# Patient Record
Sex: Female | Born: 1950 | Race: White | Hispanic: No | Marital: Married | Smoking: Never smoker
Health system: Southern US, Community
[De-identification: ages and names within clinical notes are randomized; demographics above are authoritative.]

## PROBLEM LIST (undated history)

## (undated) DIAGNOSIS — F0390 Unspecified dementia without behavioral disturbance: Secondary | ICD-10-CM

## (undated) DIAGNOSIS — K219 Gastro-esophageal reflux disease without esophagitis: Secondary | ICD-10-CM

## (undated) DIAGNOSIS — K589 Irritable bowel syndrome without diarrhea: Secondary | ICD-10-CM

## (undated) DIAGNOSIS — R002 Palpitations: Secondary | ICD-10-CM

## (undated) DIAGNOSIS — J45909 Unspecified asthma, uncomplicated: Secondary | ICD-10-CM

## (undated) DIAGNOSIS — F419 Anxiety disorder, unspecified: Secondary | ICD-10-CM

## (undated) DIAGNOSIS — M5135 Other intervertebral disc degeneration, thoracolumbar region: Secondary | ICD-10-CM

## (undated) DIAGNOSIS — G529 Cranial nerve disorder, unspecified: Secondary | ICD-10-CM

## (undated) DIAGNOSIS — I4891 Unspecified atrial fibrillation: Secondary | ICD-10-CM

## (undated) DIAGNOSIS — G35 Multiple sclerosis: Secondary | ICD-10-CM

## (undated) HISTORY — PX: NASAL RECONSTRUCTION: SHX2069

## (undated) HISTORY — DX: Other intervertebral disc degeneration, thoracolumbar region: M51.35

## (undated) HISTORY — PX: HEEL SPUR SURGERY: SHX665

## (undated) HISTORY — DX: Anxiety disorder, unspecified: F41.9

## (undated) HISTORY — DX: Unspecified asthma, uncomplicated: J45.909

## (undated) HISTORY — DX: Multiple sclerosis: G35

## (undated) HISTORY — DX: Cranial nerve disorder, unspecified: G52.9

---

## 1997-10-10 HISTORY — PX: PARTIAL HYSTERECTOMY: SHX80

## 1999-08-25 ENCOUNTER — Encounter: Admission: RE | Admit: 1999-08-25 | Discharge: 1999-08-25 | Payer: Self-pay | Admitting: Family Medicine

## 1999-08-25 ENCOUNTER — Encounter: Payer: Self-pay | Admitting: Family Medicine

## 2000-04-06 ENCOUNTER — Encounter: Admission: RE | Admit: 2000-04-06 | Discharge: 2000-04-06 | Payer: Self-pay | Admitting: Obstetrics and Gynecology

## 2000-04-06 ENCOUNTER — Encounter: Payer: Self-pay | Admitting: Obstetrics and Gynecology

## 2001-03-21 ENCOUNTER — Encounter: Admission: RE | Admit: 2001-03-21 | Discharge: 2001-03-21 | Payer: Self-pay | Admitting: Family Medicine

## 2001-03-21 ENCOUNTER — Encounter: Payer: Self-pay | Admitting: Family Medicine

## 2001-05-11 ENCOUNTER — Encounter: Admission: RE | Admit: 2001-05-11 | Discharge: 2001-05-11 | Payer: Self-pay | Admitting: Obstetrics and Gynecology

## 2001-05-11 ENCOUNTER — Encounter: Payer: Self-pay | Admitting: Obstetrics and Gynecology

## 2001-05-18 ENCOUNTER — Encounter: Admission: RE | Admit: 2001-05-18 | Discharge: 2001-05-18 | Payer: Self-pay | Admitting: Obstetrics and Gynecology

## 2001-05-18 ENCOUNTER — Encounter: Payer: Self-pay | Admitting: Obstetrics and Gynecology

## 2002-09-10 ENCOUNTER — Encounter: Admission: RE | Admit: 2002-09-10 | Discharge: 2002-09-10 | Payer: Self-pay | Admitting: Obstetrics and Gynecology

## 2002-09-10 ENCOUNTER — Encounter: Payer: Self-pay | Admitting: Obstetrics and Gynecology

## 2002-12-27 ENCOUNTER — Encounter: Admission: RE | Admit: 2002-12-27 | Discharge: 2002-12-27 | Payer: Self-pay | Admitting: Family Medicine

## 2002-12-27 ENCOUNTER — Encounter: Payer: Self-pay | Admitting: Family Medicine

## 2003-09-23 ENCOUNTER — Encounter: Admission: RE | Admit: 2003-09-23 | Discharge: 2003-09-23 | Payer: Self-pay | Admitting: Family Medicine

## 2003-11-24 ENCOUNTER — Encounter: Admission: RE | Admit: 2003-11-24 | Discharge: 2003-11-24 | Payer: Self-pay | Admitting: Obstetrics and Gynecology

## 2003-11-26 ENCOUNTER — Encounter: Admission: RE | Admit: 2003-11-26 | Discharge: 2003-11-26 | Payer: Self-pay | Admitting: Obstetrics and Gynecology

## 2005-03-16 ENCOUNTER — Encounter: Admission: RE | Admit: 2005-03-16 | Discharge: 2005-03-16 | Payer: Self-pay | Admitting: Obstetrics and Gynecology

## 2005-04-20 ENCOUNTER — Encounter: Admission: RE | Admit: 2005-04-20 | Discharge: 2005-04-20 | Payer: Self-pay | Admitting: Neurosurgery

## 2005-05-13 ENCOUNTER — Encounter: Admission: RE | Admit: 2005-05-13 | Discharge: 2005-05-13 | Payer: Self-pay | Admitting: Neurosurgery

## 2005-06-17 ENCOUNTER — Encounter: Admission: RE | Admit: 2005-06-17 | Discharge: 2005-06-17 | Payer: Self-pay | Admitting: Neurosurgery

## 2006-01-17 ENCOUNTER — Encounter: Admission: RE | Admit: 2006-01-17 | Discharge: 2006-01-17 | Payer: Self-pay | Admitting: Family Medicine

## 2006-04-06 ENCOUNTER — Encounter: Admission: RE | Admit: 2006-04-06 | Discharge: 2006-04-06 | Payer: Self-pay | Admitting: Obstetrics and Gynecology

## 2007-02-08 HISTORY — PX: LUMBAR SPINE SURGERY: SHX701

## 2007-02-20 ENCOUNTER — Inpatient Hospital Stay (HOSPITAL_COMMUNITY): Admission: RE | Admit: 2007-02-20 | Discharge: 2007-02-21 | Payer: Self-pay | Admitting: Neurosurgery

## 2007-04-18 ENCOUNTER — Encounter: Admission: RE | Admit: 2007-04-18 | Discharge: 2007-04-18 | Payer: Self-pay | Admitting: Obstetrics and Gynecology

## 2008-04-18 ENCOUNTER — Encounter: Admission: RE | Admit: 2008-04-18 | Discharge: 2008-04-18 | Payer: Self-pay | Admitting: Obstetrics and Gynecology

## 2009-01-05 ENCOUNTER — Encounter: Admission: RE | Admit: 2009-01-05 | Discharge: 2009-01-05 | Payer: Self-pay | Admitting: Neurosurgery

## 2009-07-09 ENCOUNTER — Encounter: Admission: RE | Admit: 2009-07-09 | Discharge: 2009-07-09 | Payer: Self-pay | Admitting: Obstetrics and Gynecology

## 2009-07-21 ENCOUNTER — Encounter: Admission: RE | Admit: 2009-07-21 | Discharge: 2009-07-21 | Payer: Self-pay | Admitting: Neurosurgery

## 2010-08-20 ENCOUNTER — Encounter: Admission: RE | Admit: 2010-08-20 | Discharge: 2010-08-20 | Payer: Self-pay | Admitting: Obstetrics and Gynecology

## 2011-01-12 HISTORY — PX: SHOULDER SURGERY: SHX246

## 2011-02-25 NOTE — Op Note (Signed)
NAMELAKEN, ROG                 ACCOUNT NO.:  [id number]   MEDICAL RECORD NO.:  [id number]          PATIENT TYPE:  INP   LOCATION:  2899                         FACILITY:  [facility]   PHYSICIAN:  Payton Doughty, M.D.      DATE OF BIRTH:  June 04, 1951   DATE OF PROCEDURE:  02/20/2007  DATE OF DISCHARGE:                               OPERATIVE REPORT   PREOPERATIVE DIAGNOSIS:  Foraminal disk on the left side at L3-4.   POSTOPERATIVE DIAGNOSIS:  Foraminal disk on the left side at L3-4.   OPERATIVE PROCEDURE:  Left L3-4 foraminal diskectomy.   SURGEON:  Payton Doughty, M.D.   NURSE ASSISTANT:  [facility].   DOCTOR ASSISTANT:  Coletta Memos, M.D.   ANESTHESIA:  General endotracheal.   PREPARATION:  Betadine prep and scrub with alcohol wipe.   COMPLICATIONS:  None.   BODY OF TEXT:  This is a 60 year old girl with a foraminal disk at L3-4  on the left.  Taken to the operating room and smoothly anesthetized,  intubated, placed prone on the operating table.  Following shave, prep  and drape in the usual sterile fashion, the skin was infiltrated with 1%  lidocaine with 1:400,000 epinephrine.  The skin was incised from the  bottom of L2 to the top of L4 and the lamina of L3 along with L3-4 facet  joint was exposed on the left side.  Intraoperative x-ray confirmed  correctness of the level.  Having confirmed correctness of the level,  the lateral inferior portion of the pars and the superomedial portion of  the left L3-4 facet joint were removed with a high-speed drill down the  ligamentum flavum.  The ligamentum flavum was removed, exposing the left  L3 root as it rounded the pedicle.  This was retracted superior and  slightly laterally.  Working inferiorly into the disk space, a large  fragment of disk was demonstrated.  It was grasped and removed without  difficulty.  The disk space was carefully explored and all graspable  fragments removed.  The neural foramen was carefully explored and  found  to be free of disk debris.  The wound was irrigated and hemostasis  assured.  Depo-Medrol-soaked fat was used to fill the laminotomy defect.  Successive layers of - Vicryl, 2-0 Vicryl,  and4-0 Vicryl were used to  close.  Benzoin and Steri-Strips were placed, made occlusive with Telfa  and OpSite.  The patient returned to the recovery room in good  condition.    .           ______________________________  Payton Doughty, M.D.     MWR/MEDQ  D:  02/20/2007  T:  02/20/2007  Job:  [id number]
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
## 2012-01-03 ENCOUNTER — Ambulatory Visit: Payer: BC Managed Care – PPO | Attending: Neurology | Admitting: Physical Therapy

## 2012-01-03 DIAGNOSIS — IMO0001 Reserved for inherently not codable concepts without codable children: Secondary | ICD-10-CM | POA: Insufficient documentation

## 2012-01-03 DIAGNOSIS — R269 Unspecified abnormalities of gait and mobility: Secondary | ICD-10-CM | POA: Insufficient documentation

## 2012-01-10 ENCOUNTER — Ambulatory Visit: Payer: BC Managed Care – PPO | Attending: Neurology | Admitting: Physical Therapy

## 2012-01-10 DIAGNOSIS — IMO0001 Reserved for inherently not codable concepts without codable children: Secondary | ICD-10-CM | POA: Insufficient documentation

## 2012-01-10 DIAGNOSIS — R269 Unspecified abnormalities of gait and mobility: Secondary | ICD-10-CM | POA: Insufficient documentation

## 2012-01-13 ENCOUNTER — Ambulatory Visit: Payer: BC Managed Care – PPO | Admitting: Physical Therapy

## 2012-01-16 ENCOUNTER — Ambulatory Visit: Payer: BC Managed Care – PPO | Admitting: Physical Therapy

## 2012-01-19 ENCOUNTER — Ambulatory Visit: Payer: BC Managed Care – PPO | Admitting: Physical Therapy

## 2012-01-24 ENCOUNTER — Ambulatory Visit: Payer: BC Managed Care – PPO | Admitting: Physical Therapy

## 2012-01-26 ENCOUNTER — Ambulatory Visit: Payer: BC Managed Care – PPO | Admitting: Physical Therapy

## 2012-01-30 ENCOUNTER — Ambulatory Visit: Payer: BC Managed Care – PPO | Admitting: Physical Therapy

## 2012-02-01 ENCOUNTER — Ambulatory Visit: Payer: BC Managed Care – PPO | Admitting: Physical Therapy

## 2012-02-06 ENCOUNTER — Ambulatory Visit: Payer: BC Managed Care – PPO | Admitting: Physical Therapy

## 2012-02-08 ENCOUNTER — Ambulatory Visit: Payer: BC Managed Care – PPO | Attending: Neurology | Admitting: Physical Therapy

## 2012-02-08 DIAGNOSIS — IMO0001 Reserved for inherently not codable concepts without codable children: Secondary | ICD-10-CM | POA: Insufficient documentation

## 2012-02-08 DIAGNOSIS — R269 Unspecified abnormalities of gait and mobility: Secondary | ICD-10-CM | POA: Insufficient documentation

## 2012-02-13 ENCOUNTER — Ambulatory Visit: Payer: BC Managed Care – PPO | Admitting: Physical Therapy

## 2012-02-15 ENCOUNTER — Ambulatory Visit: Payer: BC Managed Care – PPO | Admitting: Physical Therapy

## 2012-02-20 ENCOUNTER — Ambulatory Visit: Payer: BC Managed Care – PPO | Admitting: Physical Therapy

## 2012-02-22 ENCOUNTER — Ambulatory Visit: Payer: BC Managed Care – PPO | Admitting: Physical Therapy

## 2012-02-27 ENCOUNTER — Ambulatory Visit: Payer: BC Managed Care – PPO | Admitting: Physical Therapy

## 2012-02-29 ENCOUNTER — Ambulatory Visit: Payer: BC Managed Care – PPO | Admitting: Physical Therapy

## 2012-04-16 ENCOUNTER — Ambulatory Visit
Admission: RE | Admit: 2012-04-16 | Discharge: 2012-04-16 | Disposition: A | Discharge status: Home / Self Care | Payer: BC Managed Care – PPO | Source: Ambulatory Visit | Attending: Family Medicine | Admitting: Family Medicine

## 2012-04-16 ENCOUNTER — Other Ambulatory Visit: Payer: Self-pay | Admitting: Family Medicine

## 2012-04-16 DIAGNOSIS — Z1231 Encounter for screening mammogram for malignant neoplasm of breast: Secondary | ICD-10-CM

## 2012-10-25 ENCOUNTER — Other Ambulatory Visit: Payer: Self-pay | Admitting: Family Medicine

## 2012-10-25 ENCOUNTER — Ambulatory Visit
Admission: RE | Admit: 2012-10-25 | Discharge: 2012-10-25 | Disposition: A | Discharge status: Home / Self Care | Payer: BC Managed Care – PPO | Source: Ambulatory Visit | Attending: Family Medicine | Admitting: Family Medicine

## 2012-10-25 DIAGNOSIS — M25531 Pain in right wrist: Secondary | ICD-10-CM

## 2012-11-09 ENCOUNTER — Ambulatory Visit
Admission: RE | Admit: 2012-11-09 | Discharge: 2012-11-09 | Disposition: A | Discharge status: Home / Self Care | Payer: BC Managed Care – PPO | Source: Ambulatory Visit | Attending: Family Medicine | Admitting: Family Medicine

## 2012-11-09 ENCOUNTER — Other Ambulatory Visit: Payer: Self-pay | Admitting: Family Medicine

## 2012-11-09 DIAGNOSIS — W19XXXA Unspecified fall, initial encounter: Secondary | ICD-10-CM

## 2013-01-18 ENCOUNTER — Other Ambulatory Visit: Payer: Self-pay

## 2013-01-18 MED ORDER — LORAZEPAM 0.5 MG PO TABS: 0.5000 mg | ORAL_TABLET | Freq: Two times a day (BID) | ORAL | Status: DC

## 2013-01-18 NOTE — Telephone Encounter (Signed)
Former [facility] patient requesting refill.  Dr Eulah Citizen

## 2013-02-21 ENCOUNTER — Encounter: Payer: Self-pay | Admitting: Diagnostic Neuroimaging

## 2013-02-21 ENCOUNTER — Ambulatory Visit (INDEPENDENT_AMBULATORY_CARE_PROVIDER_SITE_OTHER): Payer: BC Managed Care – PPO | Admitting: Diagnostic Neuroimaging

## 2013-02-21 VITALS — BP 144/77 | HR 60 | Temp 97.7°F | Ht 65.5 in | Wt 124.0 lb

## 2013-02-21 DIAGNOSIS — G35 Multiple sclerosis: Secondary | ICD-10-CM | POA: Insufficient documentation

## 2013-02-21 NOTE — Patient Instructions (Signed)
Continue current medications. 
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
## 2013-03-10 ENCOUNTER — Other Ambulatory Visit: Payer: Self-pay

## 2013-03-10 MED ORDER — GABAPENTIN 300 MG PO CAPS: 300.0000 mg | ORAL_CAPSULE | Freq: Three times a day (TID) | ORAL | Status: DC

## 2013-03-17 ENCOUNTER — Other Ambulatory Visit: Payer: Self-pay

## 2013-03-17 MED ORDER — GABAPENTIN 300 MG PO CAPS: 300.0000 mg | ORAL_CAPSULE | Freq: Three times a day (TID) | ORAL | Status: DC

## 2013-03-28 ENCOUNTER — Other Ambulatory Visit: Payer: Self-pay

## 2013-03-28 DIAGNOSIS — Z1231 Encounter for screening mammogram for malignant neoplasm of breast: Secondary | ICD-10-CM

## 2013-04-17 ENCOUNTER — Ambulatory Visit
Admission: RE | Admit: 2013-04-17 | Discharge: 2013-04-17 | Disposition: A | Discharge status: Home / Self Care | Payer: BC Managed Care – PPO | Source: Ambulatory Visit

## 2013-04-17 DIAGNOSIS — Z1231 Encounter for screening mammogram for malignant neoplasm of breast: Secondary | ICD-10-CM

## 2013-06-03 ENCOUNTER — Telehealth: Payer: Self-pay | Admitting: Diagnostic Neuroimaging

## 2013-06-03 NOTE — Telephone Encounter (Signed)
I called pt and she will come in tomorrow at 0815 for appt with LL/NP for MS? Exacerbation.  LE weakness, worsening balance, recovering from GI illness.

## 2013-06-04 ENCOUNTER — Ambulatory Visit (INDEPENDENT_AMBULATORY_CARE_PROVIDER_SITE_OTHER): Payer: BC Managed Care – PPO | Admitting: Nurse Practitioner

## 2013-06-04 ENCOUNTER — Encounter: Payer: Self-pay | Admitting: Nurse Practitioner

## 2013-06-04 VITALS — BP 110/68 | HR 67 | Temp 97.5°F | Ht 66.0 in | Wt 114.5 lb

## 2013-06-04 DIAGNOSIS — G35 Multiple sclerosis: Secondary | ICD-10-CM
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
## 2013-06-04 NOTE — Patient Instructions (Addendum)
Check urinalysis today.  We will schedule a repeat MRI brian with and without contrast.  We will order PT for balance and stability.  From MRI results we will plan further treatment.

## 2013-06-05 LAB — URINALYSIS, ROUTINE W REFLEX MICROSCOPIC
Bilirubin, UA: NEGATIVE
Glucose, UA: NEGATIVE
Ketones, UA: NEGATIVE
Leukocytes, UA: NEGATIVE
Nitrite, UA: NEGATIVE
Protein, UA: NEGATIVE
RBC, UA: NEGATIVE
Specific Gravity, UA: 1.01 (ref 1.005–1.030)
Urobilinogen, Ur: 0.2 mg/dL (ref 0.0–1.9)
pH, UA: 6 (ref 5.0–7.5)

## 2013-06-06 ENCOUNTER — Telehealth: Payer: Self-pay

## 2013-06-06 NOTE — Telephone Encounter (Signed)
Message copied by Eduard Penkala R on Thu Jun 06, 2013 11:08 AM ------      Message from: LAM, Larita Fife E      Created: Wed Jun 05, 2013 12:17 PM       Normal results.-LL ------

## 2013-06-06 NOTE — Telephone Encounter (Signed)
Called and spoke to patient normal labs. Patient understood. 

## 2013-06-07 NOTE — Progress Notes (Signed)
I reviewed note and agree with plan.   Suanne Marker, MD 06/07/2013, 5:01 PM Certified in Neurology, Neurophysiology and Neuroimaging  [facility] [address], [address] [place], [place] [address] [phone]

## 2013-06-18 ENCOUNTER — Ambulatory Visit: Payer: BC Managed Care – PPO | Attending: Nurse Practitioner

## 2013-06-18 DIAGNOSIS — R262 Difficulty in walking, not elsewhere classified: Secondary | ICD-10-CM | POA: Insufficient documentation

## 2013-06-18 DIAGNOSIS — R42 Dizziness and giddiness: Secondary | ICD-10-CM | POA: Insufficient documentation

## 2013-06-18 DIAGNOSIS — IMO0001 Reserved for inherently not codable concepts without codable children: Secondary | ICD-10-CM | POA: Insufficient documentation

## 2013-06-18 DIAGNOSIS — R279 Unspecified lack of coordination: Secondary | ICD-10-CM | POA: Insufficient documentation

## 2013-06-19 ENCOUNTER — Ambulatory Visit: Payer: BC Managed Care – PPO

## 2013-06-25 ENCOUNTER — Ambulatory Visit: Payer: BC Managed Care – PPO

## 2013-06-27 ENCOUNTER — Ambulatory Visit (INDEPENDENT_AMBULATORY_CARE_PROVIDER_SITE_OTHER): Payer: BC Managed Care – PPO

## 2013-06-27 ENCOUNTER — Ambulatory Visit: Payer: BC Managed Care – PPO | Admitting: Rehabilitative and Restorative Service Providers"

## 2013-06-27 DIAGNOSIS — G35 Multiple sclerosis: Secondary | ICD-10-CM

## 2013-06-28 MED ORDER — GADOPENTETATE DIMEGLUMINE 469.01 MG/ML IV SOLN: 10.0000 mL | Freq: Once | INTRAVENOUS | Status: AC | PRN

## 2013-07-01 NOTE — Progress Notes (Signed)
Quick Note:  Called pt about her results. Pt verbalized understanding. ______

## 2013-07-02 ENCOUNTER — Ambulatory Visit: Payer: BC Managed Care – PPO | Admitting: Rehabilitative and Restorative Service Providers"

## 2013-07-03 ENCOUNTER — Other Ambulatory Visit: Payer: BC Managed Care – PPO

## 2013-07-04 ENCOUNTER — Other Ambulatory Visit: Payer: Self-pay

## 2013-07-04 ENCOUNTER — Ambulatory Visit: Payer: BC Managed Care – PPO

## 2013-07-04 MED ORDER — INTERFERON BETA-1A 44 MCG/0.5ML ~~LOC~~ SOLN: 44.0000 ug | SUBCUTANEOUS | Status: DC

## 2013-07-09 ENCOUNTER — Ambulatory Visit: Payer: BC Managed Care – PPO | Admitting: Rehabilitative and Restorative Service Providers"

## 2013-07-11 ENCOUNTER — Ambulatory Visit: Payer: BC Managed Care – PPO | Attending: Nurse Practitioner

## 2013-07-11 DIAGNOSIS — IMO0001 Reserved for inherently not codable concepts without codable children: Secondary | ICD-10-CM | POA: Insufficient documentation

## 2013-07-11 DIAGNOSIS — R279 Unspecified lack of coordination: Secondary | ICD-10-CM | POA: Insufficient documentation

## 2013-07-11 DIAGNOSIS — R262 Difficulty in walking, not elsewhere classified: Secondary | ICD-10-CM | POA: Insufficient documentation

## 2013-07-11 DIAGNOSIS — R42 Dizziness and giddiness: Secondary | ICD-10-CM | POA: Insufficient documentation

## 2013-07-20 ENCOUNTER — Other Ambulatory Visit: Payer: Self-pay | Admitting: Neurology

## 2013-07-22 NOTE — Telephone Encounter (Signed)
Rx signed and faxed.

## 2013-08-15 ENCOUNTER — Other Ambulatory Visit: Payer: Self-pay

## 2013-10-14 ENCOUNTER — Ambulatory Visit: Payer: BC Managed Care – PPO | Admitting: Diagnostic Neuroimaging

## 2013-10-23 ENCOUNTER — Ambulatory Visit: Payer: BC Managed Care – PPO | Admitting: Diagnostic Neuroimaging

## 2013-10-24 ENCOUNTER — Ambulatory Visit (INDEPENDENT_AMBULATORY_CARE_PROVIDER_SITE_OTHER): Payer: BC Managed Care – PPO | Admitting: Diagnostic Neuroimaging

## 2013-10-24 ENCOUNTER — Encounter: Payer: Self-pay | Admitting: Diagnostic Neuroimaging

## 2013-10-24 VITALS — BP 120/80 | HR 65 | Ht 66.5 in | Wt 112.0 lb

## 2013-10-24 DIAGNOSIS — G35 Multiple sclerosis: Secondary | ICD-10-CM
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
## 2013-11-26 ENCOUNTER — Other Ambulatory Visit: Payer: Self-pay | Admitting: Diagnostic Neuroimaging

## 2013-11-26 ENCOUNTER — Other Ambulatory Visit: Payer: Self-pay

## 2013-11-26 MED ORDER — GABAPENTIN 300 MG PO CAPS: 300.0000 mg | ORAL_CAPSULE | Freq: Three times a day (TID) | ORAL | Status: DC

## 2013-11-26 NOTE — Telephone Encounter (Signed)
Patient calling to state that [facility] says that they cannot get her Gabapentin refill to her until the end of the month and patient only has enough for 3 days. Patient is wondering if she can get a script sent to [facility] on [address] to last her until she can get it at the end of the month. Patient takes the medication 3 times a day.

## 2014-01-06 ENCOUNTER — Other Ambulatory Visit: Payer: Self-pay

## 2014-01-06 MED ORDER — LORAZEPAM 0.5 MG PO TABS: 0.5000 mg | ORAL_TABLET | Freq: Two times a day (BID) | ORAL | Status: DC

## 2014-01-14 ENCOUNTER — Telehealth: Payer: Self-pay | Admitting: Diagnostic Neuroimaging

## 2014-01-14 NOTE — Telephone Encounter (Signed)
Patient calling to state that she has been having a pain in her head since last Thursday. Patient states that it is not a headache, it is a pain that is centered in the same spot on the front of her head to the left side. Patient states it alternates between sharp and dull. Please call and advise patient.

## 2014-01-15 ENCOUNTER — Encounter: Payer: Self-pay | Admitting: Diagnostic Neuroimaging

## 2014-01-15 ENCOUNTER — Telehealth: Payer: Self-pay | Admitting: Diagnostic Neuroimaging

## 2014-01-15 NOTE — Telephone Encounter (Signed)
Pt called to check the status of the fax that was sent by [facility] for her LORazepam (ATIVAN) 0.5 MG tablet. Please call her to let her know when it has been sent to [facility].  Thank you.

## 2014-01-15 NOTE — Telephone Encounter (Signed)
This Rx was sent to [facility] on 03/30 (please see refill).  I called the patient back.  She is aware.  They can take 10-14 days to process and send Rx's.  She will call us back if anything further is needed.

## 2014-01-16 ENCOUNTER — Other Ambulatory Visit: Payer: Self-pay | Admitting: Neurology

## 2014-01-16 DIAGNOSIS — R51 Headache: Secondary | ICD-10-CM

## 2014-01-16 NOTE — Telephone Encounter (Signed)
Patient notes sharp left temporal headache. No visual changes. Will check ESR to rule out GCA in 62y/o with new onset headache.

## 2014-01-16 NOTE — Telephone Encounter (Signed)
Called patient and she has had labs completed , explained that once reviewed will call, she verbalized understanding

## 2014-01-20 ENCOUNTER — Other Ambulatory Visit: Payer: Self-pay | Admitting: Diagnostic Neuroimaging

## 2014-01-21 ENCOUNTER — Telehealth: Payer: Self-pay | Admitting: *Deleted

## 2014-01-21 NOTE — Telephone Encounter (Signed)
Called patient and asked if she could have [facility] in [place] fax the results to our office and we will give to physician to review, tried calling several times, could not reach

## 2014-01-23 NOTE — Telephone Encounter (Signed)
I called and gave the result of ESR to pt (normal).  S he stated still has L temporal headache, better now then before.  (new problem).  She has MS.  Taking gabapentin 300mg po tid (for last 2 yrs).  Made appt for 02-04-14 at 1100.  She verbalized understanding.   Last MRI 06-2013. (no changes).  

## 2014-02-04 ENCOUNTER — Encounter: Payer: Self-pay | Admitting: Diagnostic Neuroimaging

## 2014-02-04 ENCOUNTER — Ambulatory Visit (INDEPENDENT_AMBULATORY_CARE_PROVIDER_SITE_OTHER): Payer: BC Managed Care – PPO | Admitting: Diagnostic Neuroimaging

## 2014-02-04 VITALS — BP 104/67 | HR 70 | Ht 65.0 in | Wt 105.0 lb

## 2014-02-04 DIAGNOSIS — G35 Multiple sclerosis: Secondary | ICD-10-CM

## 2014-02-04 DIAGNOSIS — R51 Headache: Secondary | ICD-10-CM

## 2014-02-04 DIAGNOSIS — G35D Multiple sclerosis, unspecified: Secondary | ICD-10-CM

## 2014-02-04 MED ORDER — GABAPENTIN 300 MG PO CAPS: 600.0000 mg | ORAL_CAPSULE | Freq: Three times a day (TID) | ORAL | Status: DC

## 2014-02-04 NOTE — Patient Instructions (Signed)
Try increased gabapentin.
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
## 2014-02-06 ENCOUNTER — Ambulatory Visit (INDEPENDENT_AMBULATORY_CARE_PROVIDER_SITE_OTHER): Payer: BC Managed Care – PPO

## 2014-02-06 DIAGNOSIS — G35 Multiple sclerosis: Secondary | ICD-10-CM

## 2014-02-06 DIAGNOSIS — R51 Headache: Secondary | ICD-10-CM

## 2014-02-06 MED ORDER — GADOPENTETATE DIMEGLUMINE 469.01 MG/ML IV SOLN
10.0000 mL | Freq: Once | INTRAVENOUS | Status: AC | PRN
Start: 2014-02-06 — End: 2014-02-06

## 2014-02-12 ENCOUNTER — Telehealth: Payer: Self-pay | Admitting: *Deleted

## 2014-02-12 NOTE — Telephone Encounter (Signed)
Pls call pt with MRI results. No change from prior. MRI is stable. Continue current plan. -VRP

## 2014-02-12 NOTE — Telephone Encounter (Signed)
Patient calling to get MRI results. °

## 2014-02-13 NOTE — Telephone Encounter (Signed)
Informed patient, verbalized understanding.

## 2014-02-18 ENCOUNTER — Other Ambulatory Visit: Payer: Self-pay | Admitting: Gastroenterology

## 2014-02-18 DIAGNOSIS — R634 Abnormal weight loss: Secondary | ICD-10-CM

## 2014-02-18 DIAGNOSIS — R109 Unspecified abdominal pain: Secondary | ICD-10-CM

## 2014-02-26 ENCOUNTER — Encounter: Payer: Self-pay | Admitting: Diagnostic Neuroimaging

## 2014-02-26 ENCOUNTER — Other Ambulatory Visit: Payer: BC Managed Care – PPO

## 2014-02-27 ENCOUNTER — Ambulatory Visit
Admission: RE | Admit: 2014-02-27 | Discharge: 2014-02-27 | Disposition: A | Discharge status: Home / Self Care | Payer: BC Managed Care – HMO | Source: Ambulatory Visit | Attending: Gastroenterology | Admitting: Gastroenterology

## 2014-02-27 DIAGNOSIS — R634 Abnormal weight loss: Secondary | ICD-10-CM

## 2014-02-27 DIAGNOSIS — R109 Unspecified abdominal pain: Secondary | ICD-10-CM

## 2014-02-27 MED ORDER — IOHEXOL 300 MG/ML  SOLN
100.0000 mL | Freq: Once | INTRAMUSCULAR | Status: AC | PRN
  Administered 2014-02-27: 100 mL via INTRAVENOUS

## 2014-03-14 ENCOUNTER — Other Ambulatory Visit: Payer: Self-pay

## 2014-03-21 ENCOUNTER — Telehealth: Payer: Self-pay | Admitting: Diagnostic Neuroimaging

## 2014-03-21 ENCOUNTER — Other Ambulatory Visit: Payer: Self-pay | Admitting: Diagnostic Neuroimaging

## 2014-03-21 MED ORDER — BACLOFEN 10 MG PO TABS: 10.0000 mg | ORAL_TABLET | Freq: Every day | ORAL | Status: DC

## 2014-03-21 NOTE — Telephone Encounter (Signed)
Patient requesting refill for baclofen (LIORESAL) 10 MG tablet and send Rx to [facility], [place].

## 2014-03-21 NOTE — Telephone Encounter (Signed)
Pt's Rx was sent to pt's pharmacy. °

## 2014-04-10 LAB — SEDIMENTATION RATE: Sed Rate: 2 mm/hr (ref 0–40)

## 2014-04-17 NOTE — Progress Notes (Signed)
Quick Note:  I called pt 01-23-14 and gave her normal results. See phone note. ______

## 2014-04-22 ENCOUNTER — Ambulatory Visit (INDEPENDENT_AMBULATORY_CARE_PROVIDER_SITE_OTHER): Payer: BC Managed Care – PPO | Admitting: Diagnostic Neuroimaging

## 2014-04-22 ENCOUNTER — Encounter: Payer: Self-pay | Admitting: Diagnostic Neuroimaging

## 2014-04-22 VITALS — BP 117/75 | HR 77 | Temp 98.1°F | Ht 66.0 in | Wt 106.2 lb

## 2014-04-22 DIAGNOSIS — G35 Multiple sclerosis: Secondary | ICD-10-CM
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
## 2014-05-15 NOTE — Telephone Encounter (Signed)
Noted  

## 2014-05-30 ENCOUNTER — Telehealth: Payer: Self-pay | Admitting: *Deleted

## 2014-05-30 MED ORDER — BACLOFEN 10 MG PO TABS: 10.0000 mg | ORAL_TABLET | Freq: Every day | ORAL | Status: DC

## 2014-05-30 NOTE — Telephone Encounter (Signed)
Rx has been sent  

## 2014-06-02 DIAGNOSIS — Z0289 Encounter for other administrative examinations: Secondary | ICD-10-CM

## 2014-06-02 NOTE — Telephone Encounter (Signed)
Called patient to confirm message received.  No answer. She is requesting a letter including her diagnosis and the need for her MRI this year and PT last year so that her insurance [employer]ulance [employer]) can pay the cost.

## 2014-06-05 NOTE — Telephone Encounter (Signed)
pls draft letter for my review. -VRP

## 2014-06-06 ENCOUNTER — Encounter: Payer: Self-pay | Admitting: *Deleted

## 2014-06-06 NOTE — Telephone Encounter (Signed)
Created letter. Called patient. Faxed letter to patient as requested.

## 2014-06-11 ENCOUNTER — Telehealth: Payer: Self-pay | Admitting: Diagnostic Neuroimaging

## 2014-06-11 NOTE — Telephone Encounter (Signed)
Mailed to patient on 06/11/14.

## 2014-06-23 ENCOUNTER — Other Ambulatory Visit: Payer: Self-pay | Admitting: Family Medicine

## 2014-06-23 ENCOUNTER — Ambulatory Visit
Admission: RE | Admit: 2014-06-23 | Discharge: 2014-06-23 | Disposition: A | Discharge status: Home / Self Care | Payer: BC Managed Care – PPO | Source: Ambulatory Visit | Attending: Family Medicine | Admitting: Family Medicine

## 2014-06-23 DIAGNOSIS — R634 Abnormal weight loss: Secondary | ICD-10-CM

## 2014-06-26 ENCOUNTER — Encounter: Payer: Self-pay | Admitting: Dietician

## 2014-06-26 ENCOUNTER — Encounter: Payer: BC Managed Care – PPO | Attending: Family Medicine | Admitting: Dietician

## 2014-06-26 VITALS — Ht 66.0 in | Wt 100.0 lb

## 2014-06-26 DIAGNOSIS — R634 Abnormal weight loss: Secondary | ICD-10-CM | POA: Insufficient documentation

## 2014-06-26 DIAGNOSIS — Z713 Dietary counseling and surveillance: Secondary | ICD-10-CM | POA: Diagnosis not present

## 2014-06-26 NOTE — Progress Notes (Signed)
  Medical Nutrition Therapy:  Appt start time: 0930 end time:  1030.   Assessment:  Primary concerns today: Ms. Kimbrough is here today to discuss her weight loss and GI discomfort with most foods. Many foods cause lower abdominal discomfort, bowel leakage, and gas. Additionally, she "keeps losing weight." She has an appointment at [facility] on October 8 and is hoping for more answers regarding what is going on. Kaliegh reports that her weight loss started about 9 months ago, she has since lost about 25 pounds. She states that she loves to cook and enjoys trying new things. Tried a bowel prep/colon cleanse and reports that her abdomen swelled, and she did not have a bowel movement until the next day. Per her report, she seems to better tolerate gentle, low fiber foods like tilapia, mashed potatoes, chicken in broth, egg whites, oatmeal, and rice. She is unable to tolerate pasta, wine, caffeine, raw vegetables, carbonation, lactose, peanut butter, artificial sweeteners, and soy products. She attempted a gluten-free diet and reports that it worked at first.   Occupational hygienist Style:   No preference indicated   Learning Readiness:   Ready   MEDICATIONS: see list    Beverages: only water. "Probably not enough." Too much causes discomfort.   Usual physical activity: none  Estimated energy needs: 1800-2000 calories  Progress Towards Goal(s):  In progress.   Nutritional Diagnosis:  [place]-3.2 Unintentional weight loss As related to inability to tolerate sufficient calorie and protein intake.  As evidenced by patient report of 25-lb weight loss and gastrointestinal issues in the past 9 months.    Intervention:  Nutrition counseling provided. Goals: -Try having something small to eat several times a day -Increase protein foods (and fatty foods when tolerated): chicken, tilapia, egg whites, french fries, olive oil, [place] bacon  -Aim for 64 oz of water per day -Try Isopure products (lactose-free protein  drinks) -Keep something sugary on hand for low blood sugars  Samples provided and patient instructed on proper use: PB2 (qty 5) Lot#: [id number] Exp: 05/2015   Teaching Method Utilized:  Visual Auditory  Handouts given during visit include:  Low fiber food list  Low fiber nutrition therapy  Barriers to learning/adherence to lifestyle change: gastrointestinal discomfort  Demonstrated degree of understanding via:  Teach Back   Monitoring/Evaluation:  Dietary intake and body weight in 4-6 week(s).

## 2014-06-26 NOTE — Patient Instructions (Addendum)
-  Try having something small to eat several times a day -Increase protein foods (and fatty foods when tolerated): chicken, tilapia, egg whites, french fries, olive oil, [place] bacon  -Aim for 64 oz of water per day -Try Isopure products (lactose-free protein products) -Keep something sugary on hand for low blood sugars

## 2014-07-14 ENCOUNTER — Ambulatory Visit
Admission: RE | Admit: 2014-07-14 | Discharge: 2014-07-14 | Disposition: A | Discharge status: Home / Self Care | Payer: BC Managed Care – PPO | Source: Ambulatory Visit

## 2014-07-14 ENCOUNTER — Other Ambulatory Visit: Payer: Self-pay

## 2014-07-14 DIAGNOSIS — Z1239 Encounter for other screening for malignant neoplasm of breast: Secondary | ICD-10-CM

## 2014-07-21 ENCOUNTER — Other Ambulatory Visit: Payer: Self-pay

## 2014-07-21 MED ORDER — BACLOFEN 10 MG PO TABS: 10.0000 mg | ORAL_TABLET | Freq: Every day | ORAL | Status: DC

## 2014-07-21 MED ORDER — GABAPENTIN 300 MG PO CAPS: 600.0000 mg | ORAL_CAPSULE | Freq: Three times a day (TID) | ORAL | Status: DC

## 2014-07-21 MED ORDER — INTERFERON BETA-1A 44 MCG/0.5ML ~~LOC~~ SOLN: 44.0000 ug | SUBCUTANEOUS | Status: DC

## 2014-07-21 MED ORDER — LORAZEPAM 0.5 MG PO TABS: 0.5000 mg | ORAL_TABLET | Freq: Two times a day (BID) | ORAL | Status: DC

## 2014-07-22 NOTE — Telephone Encounter (Signed)
Rx signed and faxed.

## 2014-08-06 ENCOUNTER — Ambulatory Visit: Payer: BC Managed Care – PPO | Admitting: Dietician

## 2014-08-20 ENCOUNTER — Telehealth: Payer: Self-pay | Admitting: Diagnostic Neuroimaging

## 2014-08-20 NOTE — Telephone Encounter (Signed)
WID- Patient stated she saw doctor at [facility] last month, it

## 2014-08-20 NOTE — Telephone Encounter (Signed)
WID - Patient stated she saw doctor at [facility] last month and had waited at least 4 weeks for the appointment.  When she received appointment her weight at that time was 95 lbs.  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  Was given Antibiotics for the gut.  Questioning if her MS could have anything to do with affecting Intestinal Muscles.  Please call and advise.

## 2014-08-20 NOTE — Telephone Encounter (Signed)
Yes, MS can cause dysautonomia, such as gastric emptying difficulties,  but  overgrowth is mostly seen in response to steroid treatments.  Please call her, thanks. CD WID

## 2014-08-21 NOTE — Telephone Encounter (Signed)
I called and spoke to pt.   Gave her the information as per below per Dr. Vickey Hugerohmeier.  She is on ABX now for the overgrowth.  Will forward message to Dr.Penumalli.   Is followed by [facility] Ren[facility]issa [facility].

## 2014-10-08 ENCOUNTER — Encounter: Payer: Self-pay | Admitting: Diagnostic Neuroimaging

## 2014-10-08 ENCOUNTER — Ambulatory Visit (INDEPENDENT_AMBULATORY_CARE_PROVIDER_SITE_OTHER): Payer: BC Managed Care – PPO | Admitting: Diagnostic Neuroimaging

## 2014-10-08 VITALS — BP 105/64 | HR 68 | Temp 97.5°F | Ht 66.0 in | Wt 98.6 lb

## 2014-10-08 DIAGNOSIS — G35 Multiple sclerosis: Secondary | ICD-10-CM

## 2014-10-08 NOTE — Patient Instructions (Signed)
Continue rebif, baclofen and gabapentin.
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
## 2014-10-28 ENCOUNTER — Ambulatory Visit: Payer: BC Managed Care – PPO | Admitting: Diagnostic Neuroimaging

## 2014-11-19 ENCOUNTER — Ambulatory Visit (INDEPENDENT_AMBULATORY_CARE_PROVIDER_SITE_OTHER): Payer: BLUE CROSS/BLUE SHIELD | Admitting: Diagnostic Neuroimaging

## 2014-11-19 ENCOUNTER — Encounter: Payer: Self-pay | Admitting: Diagnostic Neuroimaging

## 2014-11-19 ENCOUNTER — Telehealth: Payer: Self-pay | Admitting: *Deleted

## 2014-11-19 VITALS — BP 139/81 | HR 69 | Ht 66.0 in | Wt 100.8 lb

## 2014-11-19 DIAGNOSIS — G35 Multiple sclerosis: Secondary | ICD-10-CM

## 2014-11-19 DIAGNOSIS — M6281 Muscle weakness (generalized): Secondary | ICD-10-CM

## 2014-11-19 DIAGNOSIS — M6289 Other specified disorders of muscle: Secondary | ICD-10-CM

## 2014-11-19 NOTE — Telephone Encounter (Signed)
Patient called stating that she is having issues with her MS. Patient is having trouble moving her legs. Patient also stated that she been dragging her legs for a couple of days now. Patient is scheduled to come in today to be seen at 330 pm but will arrive at 315
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
## 2014-11-20 ENCOUNTER — Ambulatory Visit (INDEPENDENT_AMBULATORY_CARE_PROVIDER_SITE_OTHER): Payer: BLUE CROSS/BLUE SHIELD

## 2014-11-20 DIAGNOSIS — G35 Multiple sclerosis: Secondary | ICD-10-CM

## 2014-11-20 DIAGNOSIS — M6289 Other specified disorders of muscle: Secondary | ICD-10-CM

## 2014-11-20 DIAGNOSIS — M6281 Muscle weakness (generalized): Secondary | ICD-10-CM

## 2014-11-21 MED ORDER — GADOPENTETATE DIMEGLUMINE 469.01 MG/ML IV SOLN: 12.0000 mL | Freq: Once | INTRAVENOUS | Status: AC | PRN

## 2014-11-26 ENCOUNTER — Telehealth: Payer: Self-pay | Admitting: Diagnostic Neuroimaging

## 2014-11-26 NOTE — Telephone Encounter (Signed)
Patient requesting MRI results.  Please call and advise. °

## 2014-11-28 ENCOUNTER — Telehealth: Payer: Self-pay | Admitting: *Deleted

## 2014-11-28 NOTE — Telephone Encounter (Signed)
Called to confirm that I had spoken with her about her MRI brain results and she asked about her MRI of neck. I told her I would ask Dr. Marjory Lies about this since the results are abnormal and get back with her. She stated a thanks

## 2014-12-03 NOTE — Telephone Encounter (Signed)
Called pt back and explained her MRI cervical spine to her on the phone. She stated an understanding and said that her PCP would be faxing the office some lab work pertaining to her liver function. I said I would keep an eye out for it. She thanked me

## 2014-12-18 ENCOUNTER — Ambulatory Visit (INDEPENDENT_AMBULATORY_CARE_PROVIDER_SITE_OTHER): Payer: BLUE CROSS/BLUE SHIELD | Admitting: Diagnostic Neuroimaging

## 2014-12-18 ENCOUNTER — Encounter: Payer: Self-pay | Admitting: Diagnostic Neuroimaging

## 2014-12-18 VITALS — BP 121/66 | HR 73 | Ht 65.0 in | Wt 103.6 lb

## 2014-12-18 DIAGNOSIS — G35 Multiple sclerosis: Secondary | ICD-10-CM

## 2014-12-18 DIAGNOSIS — R5383 Other fatigue: Secondary | ICD-10-CM | POA: Diagnosis not present

## 2014-12-18 MED ORDER — AMANTADINE HCL 100 MG PO CAPS: 100.0000 mg | ORAL_CAPSULE | Freq: Every day | ORAL | Status: DC
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
## 2014-12-18 NOTE — Patient Instructions (Signed)
Try amantadine 100-200mg  daily for fatigue.

## 2014-12-25 ENCOUNTER — Telehealth: Payer: Self-pay | Admitting: Diagnostic Neuroimaging

## 2014-12-25 NOTE — Telephone Encounter (Signed)
Patient stated since taking Rx amantadine (SYMMETREL) 100 MG capsule, pt has experienced nausea and diarrhea.  Didn't take medication this am, aware she needs to be weaned off and questioning is there's an alternative mediation?  Please call and advise.

## 2014-12-25 NOTE — Telephone Encounter (Signed)
I called the patient back.  Relayed providers message.  She verbalized understanding and is agreeable to this plan.

## 2014-12-25 NOTE — Telephone Encounter (Signed)
No need to wean medication. Have stop and contact us again in 1 week to discuss other meds. -VRP

## 2015-01-23 ENCOUNTER — Other Ambulatory Visit: Payer: Self-pay | Admitting: Diagnostic Neuroimaging

## 2015-02-16 ENCOUNTER — Other Ambulatory Visit: Payer: Self-pay | Admitting: Diagnostic Neuroimaging

## 2015-02-16 MED ORDER — LORAZEPAM 0.5 MG PO TABS: 0.5000 mg | ORAL_TABLET | Freq: Two times a day (BID) | ORAL | Status: DC

## 2015-02-16 NOTE — Telephone Encounter (Signed)
Patient called requesting a refill for LORazepam (ATIVAN) 0.5 MG tablet. She gets the script [facility]. Patient can be reached @ [phone]

## 2015-02-16 NOTE — Telephone Encounter (Signed)
Request entered, forwarded to provider for approval.  

## 2015-02-25 ENCOUNTER — Ambulatory Visit
Admission: RE | Admit: 2015-02-25 | Discharge: 2015-02-25 | Disposition: A | Discharge status: Home / Self Care | Payer: BLUE CROSS/BLUE SHIELD | Source: Ambulatory Visit | Attending: Family Medicine | Admitting: Family Medicine

## 2015-02-25 ENCOUNTER — Other Ambulatory Visit: Payer: Self-pay | Admitting: Family Medicine

## 2015-02-25 DIAGNOSIS — M7918 Myalgia, other site: Secondary | ICD-10-CM

## 2015-03-26 ENCOUNTER — Encounter: Payer: Self-pay | Admitting: Diagnostic Neuroimaging

## 2015-04-02 ENCOUNTER — Ambulatory Visit: Payer: BC Managed Care – PPO | Admitting: Diagnostic Neuroimaging

## 2015-04-28 ENCOUNTER — Encounter: Payer: Self-pay | Admitting: Neurology

## 2015-04-28 ENCOUNTER — Ambulatory Visit (INDEPENDENT_AMBULATORY_CARE_PROVIDER_SITE_OTHER): Payer: BLUE CROSS/BLUE SHIELD | Admitting: Neurology

## 2015-04-28 ENCOUNTER — Other Ambulatory Visit: Payer: BLUE CROSS/BLUE SHIELD

## 2015-04-28 VITALS — BP 90/62 | HR 64 | Ht 65.0 in | Wt 107.0 lb

## 2015-04-28 DIAGNOSIS — G35 Multiple sclerosis: Secondary | ICD-10-CM

## 2015-04-28 MED ORDER — MODAFINIL 200 MG PO TABS: 200.0000 mg | ORAL_TABLET | Freq: Every day | ORAL | Status: DC

## 2015-04-28 NOTE — Patient Instructions (Addendum)
1.  I will prescribe you Provigil 200mg  every morning to help with fatigue. 2.  Continue the gabapentin 600mg  twice daily and baclofen 10mg  as needed 3.  We will check CBC with diff and LFTs and vitamin D 4.  Follow up in 6 months.  Will repeat labs again prior to the follow up.
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
## 2015-05-05 ENCOUNTER — Telehealth: Payer: Self-pay | Admitting: Neurology

## 2015-05-05 NOTE — Telephone Encounter (Signed)
Pt called and said she needs a prior pre authorization for her medication and she said it started with a P and could not remember the name/Dawn CB# [phone]

## 2015-05-06 NOTE — Telephone Encounter (Signed)
Pt called for recent Lab results and need Medical approval for [facility]/ call back @ [phone]

## 2015-05-06 NOTE — Telephone Encounter (Signed)
I called patient and left message regarding prior auth it has been faxed to [facility]

## 2015-05-07 ENCOUNTER — Telehealth: Payer: Self-pay | Admitting: Neurology

## 2015-05-07 NOTE — Telephone Encounter (Signed)
Pt called and wanted to know if her lab work results were in/Dawn CB#[phone]

## 2015-05-08 NOTE — Telephone Encounter (Signed)
Patient is aware of normal labs and prior auth again has been sent to pharmacy

## 2015-05-12 ENCOUNTER — Telehealth: Payer: Self-pay | Admitting: Neurology

## 2015-05-12 NOTE — Telephone Encounter (Signed)
Pt called and wanted to know if you could call the ins. Co and answer a questionnaire so she can get her prescription of Provigil sent to her/Dawn CB# [phone] Ins# [phone]

## 2015-05-13 ENCOUNTER — Telehealth: Payer: Self-pay | Admitting: *Deleted

## 2015-05-13 NOTE — Telephone Encounter (Signed)
Provigil has been approved  Patient is aware of approval

## 2015-05-13 NOTE — Telephone Encounter (Signed)
Patients provigil has been approved  It is valid from 04/10/15-05/12/16 case is [id number] patient is aware medication can be picked up at her pharmacy

## 2015-05-29 ENCOUNTER — Telehealth: Payer: Self-pay | Admitting: *Deleted

## 2015-05-29 ENCOUNTER — Other Ambulatory Visit: Payer: Self-pay | Admitting: *Deleted

## 2015-05-29 DIAGNOSIS — G35 Multiple sclerosis: Secondary | ICD-10-CM

## 2015-05-29 DIAGNOSIS — G35D Multiple sclerosis, unspecified: Secondary | ICD-10-CM

## 2015-05-29 DIAGNOSIS — R51 Headache: Secondary | ICD-10-CM

## 2015-05-29 DIAGNOSIS — R519 Headache, unspecified: Secondary | ICD-10-CM

## 2015-05-29 NOTE — Telephone Encounter (Signed)
Patient called stating she has had a pressure new type headache for 14 days she has started a new medication provigil she stopped it  To make sure this was not causing the headache it did easy the pain but not the pressure . She was advised to go to ED if she felt it was getting worse over the weekend . Per Dr Everlena Cooper MRI brain with and without was ordered .  [facility] 06/04/15 2pm a bun and creatine will be drawn at hospital Patient is aware

## 2015-06-04 ENCOUNTER — Ambulatory Visit (HOSPITAL_COMMUNITY)
Admission: RE | Admit: 2015-06-04 | Discharge: 2015-06-04 | Disposition: A | Discharge status: Home / Self Care | Payer: BLUE CROSS/BLUE SHIELD | Source: Ambulatory Visit | Attending: Neurology | Admitting: Neurology

## 2015-06-04 DIAGNOSIS — G35 Multiple sclerosis: Secondary | ICD-10-CM | POA: Insufficient documentation

## 2015-06-04 DIAGNOSIS — R51 Headache: Secondary | ICD-10-CM | POA: Diagnosis present

## 2015-06-04 DIAGNOSIS — R519 Headache, unspecified: Secondary | ICD-10-CM

## 2015-06-04 LAB — POCT I-STAT CREATININE: CREATININE: 0.5 mg/dL (ref 0.44–1.00)

## 2015-06-04 MED ORDER — GADOBENATE DIMEGLUMINE 529 MG/ML IV SOLN
10.0000 mL | Freq: Once | INTRAVENOUS | Status: AC | PRN
  Administered 2015-06-04: 10 mL via INTRAVENOUS

## 2015-07-01 ENCOUNTER — Other Ambulatory Visit: Payer: Self-pay

## 2015-07-01 DIAGNOSIS — Z1231 Encounter for screening mammogram for malignant neoplasm of breast: Secondary | ICD-10-CM

## 2015-07-08 ENCOUNTER — Telehealth: Payer: Self-pay | Admitting: Neurology

## 2015-07-08 NOTE — Telephone Encounter (Signed)
Pt states that her MS is acting up she is Dropping the left foot more and is having numbness in the left leg please call [phone]

## 2015-07-08 NOTE — Telephone Encounter (Signed)
Sensation of the left leg is different than before   Started last week...  Left foot and leg is not totally numb but she is worried its getting worse  Dr Everlena Cooper Please advise

## 2015-07-09 ENCOUNTER — Ambulatory Visit (INDEPENDENT_AMBULATORY_CARE_PROVIDER_SITE_OTHER): Payer: BLUE CROSS/BLUE SHIELD | Admitting: Neurology

## 2015-07-09 ENCOUNTER — Telehealth: Payer: Self-pay | Admitting: *Deleted

## 2015-07-09 ENCOUNTER — Encounter: Payer: Self-pay | Admitting: Neurology

## 2015-07-09 VITALS — BP 100/68 | HR 71 | Ht 65.0 in | Wt 105.2 lb

## 2015-07-09 DIAGNOSIS — G35 Multiple sclerosis: Secondary | ICD-10-CM | POA: Diagnosis not present

## 2015-07-09 NOTE — Patient Instructions (Signed)
1.  We will set you up for Solu-Medrol  IV daily for 3 days 2.  Please call between 2 to 3 weeks with update.

## 2015-07-09 NOTE — Progress Notes (Signed)
NEUROLOGY FOLLOW UP OFFICE NOTE  [name] [id number]  HISTORY OF PRESENT ILLNESS: [name] is a 64 year old right-handed woman with IBS and anxiety who follows up for relapsing-remitting multiple sclerosis.  Notes from prior neurologist reviewed.  UPDATE: For the past 2 weeks, she reports a flare-up described as numbness of the left arm and leg, tingling and weakness of the left foot.  She also reports some difficulty with fine-motor movements of her left hand.  She is more unsteady.  She feels the symptoms have gotten worse.  She denies fever, symptoms of UTI or exposure to extreme heat.  HISTORY: In 1977, she developed numbness in the left hand, arm and leg.  She was diagnosed with MS after undergoing CT of head and lumbar puncture.  She was treated with IV ACTH for 10 days.  She has had very mild flare-ups since then, such as facial numbness.  She has required steroids approximately 3 times.  They usually will present with increased gait instability, left sided numbness or leg weakness.  They typically last greater than 1 week.  Her last flare-up was in 2013, in which she needed to receive IV steroids in the hospital.  Her chronic symptoms include fatigue, mild gait instability with occasional falls, left leg numbness, pain, episodic vertigo, or urinary/bowel incontinence.  Occasional spasms in feet or hands.  She also reports occasional bilateral kinetic and postural tremor.  She has history of migraine headaches.  In April 2015, she developed a different left temporal headache.  Sed Rate was 2.  It resolved spontaneously.  Current disease modifying agent:  Rebif (for about 8-10 years) Prior disease modifying agents:  none  Other current medication:  gabapentin  twice daily , baclofen  as needed, Provigil .  She also takes vitamin D 1000-2000 IU daily Prior medications:  Amantadine for fatigue (GI upset)  Most recent MRI of brain with and without contrast from  11/20/14 showed approximately 5-6 small sub-centimeter right hemispheric hyperintensities without abnormal enhancement.  Cervical spinal showed no cord lesions.  PAST MEDICAL HISTORY: Past Medical History  Diagnosis Date  . Multiple sclerosis   . Anxiety     MEDICATIONS: Current Outpatient Prescriptions on File Prior to Visit  Medication Sig Dispense Refill  . baclofen (LIORESAL) 10 MG tablet Take 1 tablet (10 mg total) by mouth daily. at 1 pm (Patient taking differently: Take 10 mg by mouth daily as needed. at 1 pm) 90 tablet 1  . Cholecalciferol (VITAMIN D PO) Take by mouth daily.    Marland Kitchen ezetimibe (ZETIA) 10 MG tablet Take 10 mg by mouth daily.    Marland Kitchen gabapentin (NEURONTIN) 300 MG capsule Take 2 capsules (600 mg total) by mouth 3 (three) times daily. (Patient taking differently: Take 300 mg by mouth 2 (two) times daily. ) 540 capsule 1  . LORazepam (ATIVAN) 0.5 MG tablet Take 1 tablet (0.5 mg total) by mouth 2 (two) times daily. 180 tablet 1  . meloxicam (MOBIC) 15 MG tablet Take 7.5 mg by mouth 2 (two) times daily.    . modafinil (PROVIGIL) 200 MG tablet Take 1 tablet (200 mg total) by mouth daily. 30 tablet 3  . Omega-3 Fatty Acids (FISH OIL) 1200 MG CAPS Take 1 capsule by mouth 3 (three) times daily.     Marland Kitchen PARoxetine (PAXIL-CR) 25 MG 24 hr tablet Take 25 mg by mouth every morning.  6  . Probiotic Product (PROBIOTIC DAILY) CAPS Take 1 capsule by mouth daily.    Marland Kitchen  REBIF 44 MCG/0.5ML SOSY injection INJECT 0.5 ML (44 MCG TOTAL) UNDER THE SKIN THREE TIMES A WEEK 36 Syringe 1  . rifaximin (XIFAXAN) 550 MG TABS tablet Take 2 tablets by mouth daily.      No current facility-administered medications on file prior to visit.    ALLERGIES: Allergies  Allergen Reactions  . Achromycin [Tetracycline]   . Celebrex [Celecoxib]   . Codeine Sulfate   . Red Yeast Rice [Cholestin]   . Welchol [Colesevelam Hcl]   . Zocor [Simvastatin]     FAMILY HISTORY: Family History  Problem Relation Age of  Onset  . Alzheimer's disease Mother   . Endometriosis Daughter   . Endometriosis Daughter     SOCIAL HISTORY: Social History   Social History  . Marital Status: Married    Spouse Name: Jonny Ruiz  . Number of Children: 3  . Years of Education: BA   Occupational History  . Teacher   .     Social History Main Topics  . Smoking status: Never Smoker   . Smokeless tobacco: Never Used  . Alcohol Use: Yes     Comment: occasionally 1 glass of wine  . Drug Use: No  . Sexual Activity: Not on file   Other Topics Concern  . Not on file   Social History Narrative   Pt lives at home with her spouse.   Caffeine Use: occasionally     REVIEW OF SYSTEMS: Constitutional: No fevers, chills, or sweats, no generalized fatigue, change in appetite Eyes: No visual changes, double vision, eye pain Ear, nose and throat: No hearing loss, ear pain, nasal congestion, sore throat Cardiovascular: No chest pain, palpitations Respiratory:  No shortness of breath at rest or with exertion, wheezes GastrointestinaI: No nausea, vomiting, diarrhea, abdominal pain, fecal incontinence Genitourinary:  No dysuria, urinary retention or frequency Musculoskeletal:  No neck pain, back pain Integumentary: No rash, pruritus, skin lesions Neurological: as above Psychiatric: No depression, insomnia, anxiety Endocrine: No palpitations, fatigue, diaphoresis, mood swings, change in appetite, change in weight, increased thirst Hematologic/Lymphatic:  No anemia, purpura, petechiae. Allergic/Immunologic: no itchy/runny eyes, nasal congestion, recent allergic reactions, rashes  PHYSICAL EXAM: Filed Vitals:   07/09/15 0857  BP: 100/68  Pulse: 71   General: No acute distress.  Patient appears well-groomed.  normal body habitus. Head:  Normocephalic/atraumatic Eyes:  Fundoscopic exam unremarkable without vessel changes, exudates, hemorrhages or papilledema. Neck: supple, no paraspinal tenderness, full range of  motion Heart:  Regular rate and rhythm Lungs:  Clear to auscultation bilaterally Back: No paraspinal tenderness Neurological Exam: alert and oriented to person, place, and time. Attention span and concentration intact, recent and remote memory intact, fund of knowledge intact.  Speech fluent and not dysarthric, language intact.  CN II-XII intact. Fundoscopic exam unremarkable without vessel changes, exudates, hemorrhages or papilledema.  Bulk and tone normal, 5-/5 left wrist extension, triceps, hip, quads and ankle dorsiflexion. Otherwise, muscle strength 5/5 throughout.  Reduced pinprick sensation in the left leg up to just above the knee.  She has hyperesthesia of the left foot.  Vibration sensation intact.  Deep tendon reflexes 3+ in lower extremities, 2+ upper extremities, toes downgoing.  Finger to nose with intention tremor but no dysmetria.  Heel to shin testing intact.  Gait unsteady.  Timed 25 foot walk 9.25 seconds.  Unable to tandem walk.  Romberg negative.  IMPRESSION: Relapsing-remitting multiple sclerosis, now with flare-up.  PLAN: Will set up for Solu-Medrol  IV daily for 3 days She will contact  me with update between 2 and 3 weeks.  If she still reports gait issues, will set up for PT.  If she has worsening of symptoms at that time, would consider repeating steroids/or repeat MRI.  26 minutes spent face to face with patient, over 50% spent discussing diagnosis and management.  Shon Millet, DO  CC:  Lupita Raider, MD

## 2015-07-09 NOTE — Telephone Encounter (Signed)
Noted patient to come in today. Dr Everlena Cooper is informing the front desk

## 2015-07-09 NOTE — Telephone Encounter (Signed)
Waiting on Sarajane Marek to call Morrie Sheldon back about scheduling with [facility] an appointment to go to patients house for Solumedrol IV for 3 days at home

## 2015-07-09 NOTE — Telephone Encounter (Signed)
I called and spoke with [name] this morning.  She feels that the left foot is weaker in addition to the numbness.  She denies fever, UTI symptoms or exposure to extreme heat.  i have an opening today at 9:00am, so I asked her to come at 8:45 so I can evaluate her.  At that point, we can decide if we need to pursue steroids.

## 2015-07-10 ENCOUNTER — Telehealth: Payer: Self-pay | Admitting: Neurology

## 2015-07-10 NOTE — Telephone Encounter (Signed)
PT called back in regards to a home IV and would like a call back at [phone]/Dawn

## 2015-07-10 NOTE — Telephone Encounter (Signed)
Pt called about her in home IV/ that no one has called her about it //call back @ [phone]

## 2015-07-10 NOTE — Telephone Encounter (Signed)
Returned patients call and told her we are still waiting on Liz Beach from [facility] to call us back so we can schedule this, will call her back as soon as we hear something

## 2015-07-10 NOTE — Telephone Encounter (Signed)
Returned patients call that we are waiting on Sarajane Marek to call us back to get this set up through [facility] , She is to contact [place] here when she calls back

## 2015-07-11 ENCOUNTER — Encounter (HOSPITAL_COMMUNITY): Payer: Self-pay

## 2015-07-11 ENCOUNTER — Inpatient Hospital Stay (HOSPITAL_COMMUNITY)
Admission: EM | Admit: 2015-07-11 | Discharge: 2015-07-13 | DRG: 060 | Disposition: A | Discharge status: Home / Self Care | Payer: BLUE CROSS/BLUE SHIELD | Attending: Internal Medicine | Admitting: Internal Medicine

## 2015-07-11 ENCOUNTER — Inpatient Hospital Stay (HOSPITAL_COMMUNITY): Payer: BLUE CROSS/BLUE SHIELD

## 2015-07-11 DIAGNOSIS — G629 Polyneuropathy, unspecified: Secondary | ICD-10-CM

## 2015-07-11 DIAGNOSIS — K6389 Other specified diseases of intestine: Secondary | ICD-10-CM | POA: Diagnosis not present

## 2015-07-11 DIAGNOSIS — H532 Diplopia: Secondary | ICD-10-CM | POA: Diagnosis present

## 2015-07-11 DIAGNOSIS — F419 Anxiety disorder, unspecified: Secondary | ICD-10-CM | POA: Diagnosis present

## 2015-07-11 DIAGNOSIS — R51 Headache: Secondary | ICD-10-CM | POA: Diagnosis not present

## 2015-07-11 DIAGNOSIS — Z820 Family history of epilepsy and other diseases of the nervous system: Secondary | ICD-10-CM | POA: Diagnosis not present

## 2015-07-11 DIAGNOSIS — E785 Hyperlipidemia, unspecified: Secondary | ICD-10-CM | POA: Diagnosis present

## 2015-07-11 DIAGNOSIS — G35 Multiple sclerosis: Principal | ICD-10-CM | POA: Diagnosis present

## 2015-07-11 HISTORY — DX: Irritable bowel syndrome, unspecified: K58.9

## 2015-07-11 LAB — CBC WITH DIFFERENTIAL/PLATELET
BASOS PCT: 1 %
Basophils Absolute: 0 10*3/uL (ref 0.0–0.1)
Eosinophils Absolute: 0.1 10*3/uL (ref 0.0–0.7)
Eosinophils Relative: 1 %
HEMATOCRIT: 37.2 % (ref 36.0–46.0)
HEMOGLOBIN: 12.1 g/dL (ref 12.0–15.0)
LYMPHS ABS: 2 10*3/uL (ref 0.7–4.0)
Lymphocytes Relative: 38 %
MCH: 30 pg (ref 26.0–34.0)
MCHC: 32.5 g/dL (ref 30.0–36.0)
MCV: 92.3 fL (ref 78.0–100.0)
MONOS PCT: 7 %
Monocytes Absolute: 0.4 10*3/uL (ref 0.1–1.0)
NEUTROS ABS: 2.8 10*3/uL (ref 1.7–7.7)
NEUTROS PCT: 53 %
Platelets: 146 10*3/uL — ABNORMAL LOW (ref 150–400)
RBC: 4.03 MIL/uL (ref 3.87–5.11)
RDW: 12.8 % (ref 11.5–15.5)
WBC: 5.2 10*3/uL (ref 4.0–10.5)

## 2015-07-11 LAB — COMPREHENSIVE METABOLIC PANEL
ALBUMIN: 3.5 g/dL (ref 3.5–5.0)
ALK PHOS: 41 U/L (ref 38–126)
ALT: 28 U/L (ref 14–54)
ANION GAP: 6 (ref 5–15)
AST: 32 U/L (ref 15–41)
BILIRUBIN TOTAL: 0.7 mg/dL (ref 0.3–1.2)
BUN: 21 mg/dL — AB (ref 6–20)
CALCIUM: 9 mg/dL (ref 8.9–10.3)
CO2: 28 mmol/L (ref 22–32)
Chloride: 108 mmol/L (ref 101–111)
Creatinine, Ser: 0.55 mg/dL (ref 0.44–1.00)
GFR calc Af Amer: >60 mL/min (ref >60)
GFR calc non Af Amer: >60 mL/min (ref >60)
GLUCOSE: 92 mg/dL (ref 65–99)
Potassium: 3.6 mmol/L (ref 3.5–5.1)
Sodium: 142 mmol/L (ref 135–145)
TOTAL PROTEIN: 5.7 g/dL — AB (ref 6.5–8.1)

## 2015-07-11 MED ORDER — RIFAXIMIN 550 MG PO TABS: 550.0000 mg | ORAL_TABLET | Freq: Two times a day (BID) | ORAL | Status: DC

## 2015-07-11 MED ORDER — MELOXICAM 7.5 MG PO TABS
7.5000 mg | ORAL_TABLET | Freq: Two times a day (BID) | ORAL | Status: DC
  Administered 2015-07-11 – 2015-07-13 (×4): 7.5 mg via ORAL
  Filled 2015-07-11 (×4): qty 1

## 2015-07-11 MED ORDER — PAROXETINE HCL ER 25 MG PO TB24
25.0000 mg | ORAL_TABLET | Freq: Every morning | ORAL | Status: DC
  Administered 2015-07-12 – 2015-07-13 (×2): 25 mg via ORAL
  Filled 2015-07-11 (×2): qty 1

## 2015-07-11 MED ORDER — BACLOFEN 10 MG PO TABS: 10.0000 mg | ORAL_TABLET | Freq: Every day | ORAL | Status: DC | PRN

## 2015-07-11 MED ORDER — SODIUM CHLORIDE 0.9 % IJ SOLN
3.0000 mL | Freq: Two times a day (BID) | INTRAMUSCULAR | Status: DC
  Administered 2015-07-11 – 2015-07-13 (×3): 3 mL via INTRAVENOUS

## 2015-07-11 MED ORDER — GABAPENTIN 300 MG PO CAPS
300.0000 mg | ORAL_CAPSULE | Freq: Two times a day (BID) | ORAL | Status: DC
  Administered 2015-07-11 – 2015-07-12 (×2): 300 mg via ORAL
  Filled 2015-07-11 (×2): qty 1

## 2015-07-11 MED ORDER — FAMOTIDINE 20 MG PO TABS
20.0000 mg | ORAL_TABLET | Freq: Two times a day (BID) | ORAL | Status: DC
  Administered 2015-07-11 – 2015-07-13 (×4): 20 mg via ORAL
  Filled 2015-07-11 (×4): qty 1

## 2015-07-11 MED ORDER — SODIUM CHLORIDE 0.9 % IV SOLN
1000.0000 mg | Freq: Once | INTRAVENOUS | Status: AC
  Administered 2015-07-11: 1000 mg via INTRAVENOUS
  Filled 2015-07-11: qty 8

## 2015-07-11 MED ORDER — PROBIOTIC DAILY PO CAPS: 1.0000 | ORAL_CAPSULE | Freq: Every day | ORAL | Status: DC

## 2015-07-11 MED ORDER — LORAZEPAM 0.5 MG PO TABS
0.5000 mg | ORAL_TABLET | Freq: Two times a day (BID) | ORAL | Status: DC
  Administered 2015-07-11 – 2015-07-13 (×4): 0.5 mg via ORAL
  Filled 2015-07-11 (×4): qty 1

## 2015-07-11 MED ORDER — GADOBENATE DIMEGLUMINE 529 MG/ML IV SOLN
10.0000 mL | Freq: Once | INTRAVENOUS | Status: AC | PRN
  Administered 2015-07-11: 10 mL via INTRAVENOUS

## 2015-07-11 MED ORDER — SODIUM CHLORIDE 0.9 % IV SOLN
1000.0000 mg | INTRAVENOUS | Status: AC
  Administered 2015-07-12 – 2015-07-13 (×2): 1000 mg via INTRAVENOUS
  Filled 2015-07-11 (×2): qty 8

## 2015-07-11 MED ORDER — RIFAXIMIN 550 MG PO TABS
550.0000 mg | ORAL_TABLET | Freq: Two times a day (BID) | ORAL | Status: DC
  Administered 2015-07-11: 550 mg via ORAL

## 2015-07-11 MED ORDER — INTERFERON BETA-1A 44 MCG/0.5ML ~~LOC~~ SOSY
44.0000 ug | PREFILLED_SYRINGE | SUBCUTANEOUS | Status: DC
  Administered 2015-07-11: 44 ug via SUBCUTANEOUS

## 2015-07-11 MED ORDER — SODIUM CHLORIDE 0.9 % IV SOLN
1000.0000 mg | Freq: Once | INTRAVENOUS | Status: DC
  Filled 2015-07-11 (×2): qty 8

## 2015-07-11 MED ORDER — RIFAXIMIN 550 MG PO TABS
550.0000 mg | ORAL_TABLET | Freq: Three times a day (TID) | ORAL | Status: DC
  Administered 2015-07-11: 550 mg via ORAL
  Filled 2015-07-11 (×2): qty 1

## 2015-07-11 MED ORDER — ACETAMINOPHEN 325 MG PO TABS
650.0000 mg | ORAL_TABLET | Freq: Four times a day (QID) | ORAL | Status: DC | PRN
  Administered 2015-07-11 – 2015-07-13 (×4): 650 mg via ORAL
  Filled 2015-07-11 (×4): qty 2

## 2015-07-11 MED ORDER — ENOXAPARIN SODIUM 40 MG/0.4ML ~~LOC~~ SOLN
40.0000 mg | SUBCUTANEOUS | Status: DC
  Administered 2015-07-11 – 2015-07-12 (×2): 40 mg via SUBCUTANEOUS
  Filled 2015-07-11 (×4): qty 0.4

## 2015-07-11 MED ORDER — ACETAMINOPHEN 650 MG RE SUPP: 650.0000 mg | Freq: Four times a day (QID) | RECTAL | Status: DC | PRN

## 2015-07-11 MED ORDER — RIFAXIMIN 550 MG PO TABS
550.0000 mg | ORAL_TABLET | Freq: Two times a day (BID) | ORAL | Status: DC
Start: 2015-07-12 — End: 2015-07-13
  Administered 2015-07-12 – 2015-07-13 (×3): 550 mg via ORAL
  Filled 2015-07-11 (×4): qty 1

## 2015-07-11 MED ORDER — MODAFINIL 200 MG PO TABS: 200.0000 mg | ORAL_TABLET | Freq: Every day | ORAL | Status: DC

## 2015-07-11 MED ORDER — EZETIMIBE 10 MG PO TABS
10.0000 mg | ORAL_TABLET | Freq: Every day | ORAL | Status: DC
Start: 2015-07-11 — End: 2015-07-13
  Administered 2015-07-11 – 2015-07-12 (×2): 10 mg via ORAL
  Filled 2015-07-11 (×2): qty 1

## 2015-07-11 MED ORDER — OMEGA-3-ACID ETHYL ESTERS 1 G PO CAPS
1.0000 g | ORAL_CAPSULE | Freq: Two times a day (BID) | ORAL | Status: DC
  Administered 2015-07-11 – 2015-07-13 (×4): 1 g via ORAL
  Filled 2015-07-11 (×4): qty 1
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
## 2015-07-11 NOTE — Consult Note (Signed)
Neurology Consultation Reason for Consult: MS flare Referring Physician: Hyacinth Meeker, B  CC: MS flare  History is obtained from: Patient, husband  HPI: [name] is a 64 y.o. female with a history of multiple sclerosis for many years his last flare was approximately 3 years ago treated with IV steroids. She has been getting weaker on the left side of the past few days she notes that this is happened previously with her MS flares. She also notes that she occasionally has some difficulty with swallowing, though her difficulty is not severe at current time.  She saw her physician on the 29th who was trying to arrange for home IV Solu-Medrol, but it is not clear why this fell through. She did not get to start her IV steroids and is continuing to get worse and having some difficulty walking and therefore sought treatment in the emergency room today.     ROS: A 14 point ROS was performed and is negative except as noted in the HPI.   Past Medical History  Diagnosis Date  . Multiple sclerosis (HCC)   . Anxiety   . IBS (irritable bowel syndrome)      Family History  Problem Relation Age of Onset  . Alzheimer's disease Mother   . Endometriosis Daughter   . Endometriosis Daughter      Social History:  reports that she has never smoked. She has never used smokeless tobacco. She reports that she drinks alcohol. She reports that she does not use illicit drugs.   Exam: Current vital signs: BP 139/99 mmHg  Pulse 59  Temp(Src) 98 F (36.7 C) (Oral)  Resp 15  SpO2 100% Vital signs in last 24 hours: Temp:  [98 F (36.7 C)] 98 F (36.7 C) (10/01 1412) Pulse Rate:  [59-70] 59 (10/01 1805) Resp:  [13-18] 15 (10/01 1805) BP: (125-139)/(59-99) 139/99 mmHg (10/01 1805) SpO2:  [98 %-100 %] 100 % (10/01 1805)   Physical Exam  Constitutional: Appears well-developed and well-nourished.  Psych: Affect appropriate to situation Eyes: No scleral injection HENT: No OP obstrucion Head:  Normocephalic.  Cardiovascular: Normal rate and regular rhythm.  Respiratory: Effort normal and breath sounds normal to anterior ascultation GI: Soft.  No distension. There is no tenderness.  Skin: WDI  Neuro: Mental Status: Patient is awake, alert, oriented to person, place, month, year, and situation. Patient is able to give a clear and coherent history. No signs of aphasia or neglect Cranial Nerves: II: Visual Fields are full. Pupils are equal, round, and reactive to light.   III,IV, VI: EOMI without ptosis or diploplia.  V: Facial sensation is symmetric to temperature VII: Facial movement is symmetric.  VIII: hearing is intact to voice X: Uvula elevates symmetrically XI: Shoulder shrug is symmetric. XII: tongue is midline without atrophy or fasciculations.  Motor: Tone is normal. Bulk is normal. 5/5 strength was present on the right side, she has 4/5 weakness in the left leg, 4+/5 weakness in the left arm Sensory: Sensation is mildly diminished in the left arm >leg  Cerebellar: FNF finger-nose-finger intact bilaterally   I have reviewed labs in epic and the results pertinent to this consultation are: CMP-unremarkable  I have reviewed the images obtained: MRI brain-11/2014- subcortical white matter change  Impression: 64 year old female with multiple sclerosis with likely MS flare being admitted for IV Solu-Medrol and physical therapy evaluation.  Recommendations: 1) IV Solu-Medrol 1 g daily for 3 days 2) physical therapy 3) famotidine while on steroids    Corliss Blacker  Leonel Ramsay, MD [facility] [phone]  If 7pm- 7am, please page neurology on call as listed in [place].

## 2015-07-11 NOTE — Progress Notes (Signed)
Patient performed -55 on NIF and .650 on FVC.
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
## 2015-07-11 NOTE — ED Provider Notes (Signed)
The patient is 64 years old, she has a history of multiple sclerosis, has had a relapse 3 years ago, she started to relax last week when she started to have numbness of her face, numbness of her arm and then started to have numbness of her leg and weakness in her arm. She was unable to get set up at home for home health Solu-Medrol, presents at the request of her doctor for emergent sterile. She has no visual difficulties at this time, she has had some difficulty with swallowing. On my exam she does have mild weakness of the left upper extremity, left lower extremity, overall the sensation is normal, speech is normal, cranial nerves III through XII are normal, we'll discuss with neurology regarding Solu-Medrol dosing and regarding admission versus home therapy. As it is the weekend she may not be able to get home health set up over the weekend.  Medical screening examination/treatment/procedure(s) were conducted as a shared visit with non-physician practitioner(s) and myself.  I personally evaluated the patient during the encounter.  Clinical Impression:   Final diagnoses:  Multiple sclerosis exacerbation (HCC)         Eber Hong, MD 07/13/15 1728

## 2015-07-11 NOTE — ED Notes (Addendum)
Patient reports "flair up" of MS starting Monday, reports that contacted primary care for home health administration of Solumedrol.  Reports that never received calls to set up home care.  [facility] advised her to report to the ED.  Patient denies any major pain just some tightness in her chest numbness in her left leg and some difficulty walking. Has not taken any of her medications today.

## 2015-07-12 DIAGNOSIS — G35 Multiple sclerosis: Principal | ICD-10-CM

## 2015-07-12 DIAGNOSIS — K6389 Other specified diseases of intestine: Secondary | ICD-10-CM

## 2015-07-12 DIAGNOSIS — E785 Hyperlipidemia, unspecified: Secondary | ICD-10-CM

## 2015-07-12 DIAGNOSIS — H532 Diplopia: Secondary | ICD-10-CM

## 2015-07-12 DIAGNOSIS — R51 Headache: Secondary | ICD-10-CM

## 2015-07-12 LAB — SEDIMENTATION RATE: Sed Rate: 7 mm/hr (ref 0–22)

## 2015-07-12 MED ORDER — GABAPENTIN 300 MG PO CAPS
300.0000 mg | ORAL_CAPSULE | Freq: Three times a day (TID) | ORAL | Status: DC
  Administered 2015-07-12 – 2015-07-13 (×4): 300 mg via ORAL
  Filled 2015-07-12 (×4): qty 1

## 2015-07-12 NOTE — Progress Notes (Signed)
[facility] PROGRESS NOTE   [name] ION:[id number] DOB: 30-Sep-1951 DOA: 07/11/2015 PCP: Lupita Raider, MD  HPI/Subjective: Denies fever chills, still has tingling in her left upper and lower extremity.  Assessment/Plan: Principal Problem:   Multiple sclerosis exacerbation (HCC) Active Problems:   Small intestinal bacterial overgrowth   Peripheral neuropathy (HCC)   HLD (hyperlipidemia)   Diplopia    Multiple sclerosis exacerbation -Presented with weakness and tingling in the left upper and lower extremities. -NIF and vital capacity every 4 hours; incentive spirometry every 4 hours -IV Solu-Medrol 1 g every 24 hours with first dose given in ER times a total of 3 days -Resume home medications.   Small intestinal bacterial overgrowth -Continue preadmission rifaximin -Regular diet   Peripheral neuropathy -Previously documented as being on Neurontin; recommend resume usual home medications once clarified -Unclear if she has   HLD (hyperlipidemia) -Restarted home medications.    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 This is being on for the past several month, likely secondary to MS as well, per neurology rule out temporal arteritis.  Code Status: Full Code Family Communication: Plan discussed with the patient. Disposition Plan: Remains inpatient Diet: Diet regular Room service appropriate?: Yes; Fluid consistency:: Thin  Consultants:  Neuro  Procedures:  None  Antibiotics:  Xifaxan   Objective: Filed Vitals:   07/28/15 0444  BP: 122/71  Pulse: 62  Temp: 97.9 F (36.6 C)  Resp: 18    Intake/Output Summary (Last 24 hours) at 07/28/2015 1223 Last data filed at 2015/07/28 0900  Gross per 24 hour  Intake    418 ml  Output   2550 ml  Net  -2132 ml   Filed Weights   07/11/15 1805 07/11/15 1904 07/28/15 0500  Weight: 48.081 kg (106 lb) 48.3 kg (106 lb 7.7 oz) 48 kg (105 lb 13.1 oz)     Exam: General: Alert and awake, oriented x3, not in any acute distress. HEENT: anicteric sclera, pupils reactive to light and accommodation, EOMI CVS: S1-S2 clear, no murmur rubs or gallops Chest: clear to auscultation bilaterally, no wheezing, rales or rhonchi Abdomen: soft nontender, nondistended, normal bowel sounds, no organomegaly Extremities: no cyanosis, clubbing or edema noted bilaterally Neuro: Cranial nerves II-XII intact, no focal neurological deficits  Data Reviewed: Basic Metabolic Panel:  Recent Labs Lab 07/11/15 1728  NA 142  K 3.6  CL 108  CO2 28  GLUCOSE 92  BUN 21*  CREATININE 0.55  CALCIUM 9.0   Liver Function Tests:  Recent Labs Lab 07/11/15 1728  AST 32  ALT 28  ALKPHOS 41  BILITOT 0.7  PROT 5.7*  ALBUMIN 3.5   No results for input(s): LIPASE, AMYLASE in the last 168 hours. No results for input(s): AMMONIA in the last 168 hours. CBC:  Recent Labs Lab 07/11/15 1728  WBC 5.2  NEUTROABS 2.8  HGB 12.1  HCT 37.2  MCV 92.3  PLT 146*   Cardiac Enzymes: No results for input(s): CKTOTAL, CKMB, CKMBINDEX, TROPONINI in the last 168 hours. BNP (last 3 results) No results for input(s): BNP in the last 8760 hours.  ProBNP (last 3 results) No results for input(s): PROBNP in the last 8760 hours.  CBG: No results for input(s): GLUCAP in the last 168 hours.  Micro No results found for this or any previous visit (from the past 240 hour(s)).   Studies: Mr Lodema Pilot Contrast  07-28-2015   CLINICAL DATA:  Progressive LEFT-sided weakness for a few  days, similar symptoms with prior multiple sclerosis flares. History of relapsing remitting multiple sclerosis, hyperlipidemia.  EXAM: MRI HEAD WITHOUT AND WITH CONTRAST  TECHNIQUE: Multiplanar, multiecho pulse sequences of the brain and surrounding structures were obtained without and with intravenous contrast.  CONTRAST:  33mL MULTIHANCE GADOBENATE DIMEGLUMINE 529 MG/ML IV SOLN  COMPARISON:  MRI of  the brain June 04, 2015  FINDINGS: The ventricles and sulci are normal for patient's age. At least 6 ovoid supratentorial white matter subcentimeter T2 hyperintensities, many of which radiate from the periventricular margin, with low T1 signal compatible with black holes of demyelination. No new white matter lesions. No mass lesions, mass effect. No abnormal parenchymal enhancement. No reduced diffusion to suggest acute ischemia nor hyperacute demyelination. No susceptibility artifact to suggest hemorrhage.  No abnormal extra-axial fluid collections. No extra-axial masses nor leptomeningeal enhancement. Normal major intracranial vascular flow voids seen at the skull base.  Ocular globes and orbital contents are unremarkable though not tailored for evaluation. No suspicious calvarial bone marrow signal. No abnormal sellar expansion. Craniocervical junction maintained. Visualized paranasal sinuses and mastoid air cells are well-aerated.  IMPRESSION: No acute intracranial process.  Stable less than 10 supratentorial white matter lesions compatible with history of multiple sclerosis without acute inflammation/demyelination. No parenchymal brain volume loss for age.   Electronically Signed   By: Awilda Metro M.D.   On: 07/12/2015 00:13    Scheduled Meds: . enoxaparin (LOVENOX) injection  40 mg Subcutaneous Q24H  . ezetimibe  10 mg Oral Daily  . famotidine  20 mg Oral BID  . gabapentin  300 mg Oral TID  . interferon beta-1a  44 mcg Subcutaneous Once per day on Mon Wed Fri  . LORazepam  0.5 mg Oral BID  . meloxicam  7.5 mg Oral BID  . methylPREDNISolone (SOLU-MEDROL) injection  1,000 mg Intravenous Q24H  . omega-3 acid ethyl esters  1 g Oral BID  . PARoxetine  25 mg Oral q morning - 10a  . rifaximin  550 mg Oral BID  . sodium chloride  3 mL Intravenous Q12H   Continuous Infusions:      Time spent: 35 minutes    [facility]  [facility] Pager [phone] If 7PM-7AM, please contact  night-coverage at [email or web address], password [facility] 07/12/2015, 12:23 PM  LOS: 1 day

## 2015-07-12 NOTE — Progress Notes (Signed)
Subjective: No acute events overnight. Complains of diploplia with left gaze present for 2 - 3 months. She also complains of headaches that started after reducing gabapentin to 380m BID, unilateral headaches, often retro-orbital lasting for hours.   Exam: Filed Vitals:   07/12/15 0444  BP: 122/71  Pulse: 62  Temp: 97.9 F (36.6 C)  Resp: 18   Gen: In bed, NAD Resp: non-labored breathing, no acute distress Abd: soft, nt  Neuro: MS: Awake, alert, interactive and appropriate.  CN: minimal left abduction palsy, diploplia on leftward gaze.  Motor: 4/5 left sided weakness. Downward drift of the left arm without pronation.  Sensory: decreased on left.   Impression: 64yo F with MS and left sided weakness. Headaches are possibly related to reducing gabapentin with h/o migraines and seeing less preventative effect. I will increase this. Diplopia possibly related to MS. Given new diploplia and different headaches, however, will need ESR to rule out temporal arteritis.   Recommendations: 1) Continue IV solumedrol x 3 days.  2) increase gabapentin to 3068mTID.  3) continue famotidine.   McRoland RackMD [facility] [phone] 7pm- 7am, please page neurology on call as listed in [place]

## 2015-07-13 LAB — RENAL FUNCTION PANEL
Albumin: 3.7 g/dL (ref 3.5–5.0)
Anion gap: 5 (ref 5–15)
BUN: 21 mg/dL — ABNORMAL HIGH (ref 6–20)
CHLORIDE: 105 mmol/L (ref 101–111)
CO2: 29 mmol/L (ref 22–32)
Calcium: 9.6 mg/dL (ref 8.9–10.3)
Creatinine, Ser: 0.65 mg/dL (ref 0.44–1.00)
Glucose, Bld: 148 mg/dL — ABNORMAL HIGH (ref 65–99)
POTASSIUM: 4.8 mmol/L (ref 3.5–5.1)
Phosphorus: 4.5 mg/dL (ref 2.5–4.6)
Sodium: 139 mmol/L (ref 135–145)

## 2015-07-13 LAB — CBC
HEMATOCRIT: 37.3 % (ref 36.0–46.0)
Hemoglobin: 12.6 g/dL (ref 12.0–15.0)
MCH: 30.9 pg (ref 26.0–34.0)
MCHC: 33.8 g/dL (ref 30.0–36.0)
MCV: 91.4 fL (ref 78.0–100.0)
Platelets: 148 10*3/uL — ABNORMAL LOW (ref 150–400)
RBC: 4.08 MIL/uL (ref 3.87–5.11)
RDW: 12.9 % (ref 11.5–15.5)
WBC: 8.5 10*3/uL (ref 4.0–10.5)

## 2015-07-13 NOTE — Care Management Note (Addendum)
Case Management Note  Patient Details  Name: TANECIA HIGHSMITH MRN: [id number] Date of Birth: [birth year]  Subjective/Objective:      Date:  07/13/15 Spoke with patient at the bedside. Introduced self as Sports coach and explained role in discharge planning and how to be reached. Verified patient lives in [place], alone with spouse, has DME cane (single point), and walking sticks. Expressed no  potential need for no other DME. Verified patient anticipates to go home with family,  at time of discharge and will have full-time supervision by family  at this time to best of their knowledge. Patient denied needing help with their medication. Patient drives  to MD appointments. Verified patient has PCP Clelia Croft. Patient will probably need pt eval, CN aware. Per MD set, patient up with outpt physical therapy, referral sent over thru epic.    Plan: CM will continue to follow for discharge planning and [facility] resources.               Action/Plan:   Expected Discharge Date:                  Expected Discharge Plan:  Home/Self Care  In-House Referral:     Discharge planning Services  CM Consult  Post Acute Care Choice:    Choice offered to:     DME Arranged:    DME Agency:     HH Arranged:    HH Agency:     Status of Service:  In process, will continue to follow  Medicare Important Message Given:    Date Medicare IM Given:    Medicare IM give by:    Date Additional Medicare IM Given:    Additional Medicare Important Message give by:     If discussed at Long Length of Stay Meetings, dates discussed:    Additional Comments:  Leone Haven, RN 07/13/2015, 12:06 PM

## 2015-07-13 NOTE — Progress Notes (Signed)
NEURO HOSPITALIST PROGRESS NOTE   SUBJECTIVE:                                                                                                                        Feels that she is gradually improving. Her weakness is mainly in her left side. IV solumedrol 3/3 days today. PT evaluation pending.  OBJECTIVE:                                                                                                                           Vital signs in last 24 hours: Temp:  [97.8 F (36.6 C)-98.5 F (36.9 C)] 97.8 F (36.6 C) (10/03 0511) Pulse Rate:  [60-73] 60 (10/03 0511) Resp:  [14-18] 14 (10/03 0511) BP: (115-122)/(64-72) 115/72 mmHg (10/03 0511) SpO2:  [97 %-100 %] 99 % (10/03 0511)  Intake/Output from previous day: 10/02 0701 - 10/03 0700 In: 480 [P.O.:480] Out: 2250 [Urine:2250] Intake/Output this shift:   Nutritional status: Diet regular Room service appropriate?: Yes; Fluid consistency:: Thin  Past Medical History  Diagnosis Date  . Multiple sclerosis (HCC)   . Anxiety   . IBS (irritable bowel syndrome)    Gen: In bed, NAD Resp: non-labored breathing, no acute distress Abd: soft, nt  Neurologic Exam:  MS: Awake, alert, interactive and appropriate.  CN: minimal left abduction palsy, diploplia on leftward gaze.  Motor: significant for 4/5 weakness left LE and left arm. Sensory: decreased on left DTR's: hyperreflexia lower extremities. Gait: no tested  Lab Results: No results found for: CHOL Lipid Panel No results for input(s): CHOL, TRIG, HDL, CHOLHDL, VLDL, LDLCALC in the last 72 hours.  Studies/Results: Mr Laqueta Jean Wo Contrast  07/12/2015   CLINICAL DATA:  Progressive LEFT-sided weakness for a few days, similar symptoms with prior multiple sclerosis flares. History of relapsing remitting multiple sclerosis, hyperlipidemia.  EXAM: MRI HEAD WITHOUT AND WITH CONTRAST  TECHNIQUE: Multiplanar, multiecho pulse sequences of the  brain and surrounding structures were obtained without and with intravenous contrast.  CONTRAST:  48mL MULTIHANCE GADOBENATE DIMEGLUMINE 529 MG/ML IV SOLN  COMPARISON:  MRI of the brain June 04, 2015  FINDINGS: The ventricles and sulci are normal for patient's age. At least 6 ovoid supratentorial white matter subcentimeter T2  hyperintensities, many of which radiate from the periventricular margin, with low T1 signal compatible with black holes of demyelination. No new white matter lesions. No mass lesions, mass effect. No abnormal parenchymal enhancement. No reduced diffusion to suggest acute ischemia nor hyperacute demyelination. No susceptibility artifact to suggest hemorrhage.  No abnormal extra-axial fluid collections. No extra-axial masses nor leptomeningeal enhancement. Normal major intracranial vascular flow voids seen at the skull base.  Ocular globes and orbital contents are unremarkable though not tailored for evaluation. No suspicious calvarial bone marrow signal. No abnormal sellar expansion. Craniocervical junction maintained. Visualized paranasal sinuses and mastoid air cells are well-aerated.  IMPRESSION: No acute intracranial process.  Stable less than 10 supratentorial white matter lesions compatible with history of multiple sclerosis without acute inflammation/demyelination. No parenchymal brain volume loss for age.   Electronically Signed   By: Awilda Metro M.D.   On: 07/12/2015 00:13    MEDICATIONS                                                                                                                        Scheduled: . enoxaparin (LOVENOX) injection  40 mg Subcutaneous Q24H  . ezetimibe  10 mg Oral Daily  . famotidine  20 mg Oral BID  . gabapentin  300 mg Oral TID  . interferon beta-1a  44 mcg Subcutaneous Once per day on Mon Wed Fri  . LORazepam  0.5 mg Oral BID  . meloxicam  7.5 mg Oral BID  . methylPREDNISolone (SOLU-MEDROL) injection  1,000 mg Intravenous Q24H  .  omega-3 acid ethyl esters  1 g Oral BID  . PARoxetine  25 mg Oral q morning - 10a  . rifaximin  550 mg Oral BID  . 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 Gradually improving. Complete 3 days IV solumedrol, PT eval, and discharge home on prednisone 40 mg x 5 days. Neurology will sign off    Wyatt Portela, MD [facility] [phone]  07/13/2015, 8:22 AM

## 2015-07-13 NOTE — Telephone Encounter (Signed)
Patient called the on call center over the weekend and was informed to go to the ED.Marland Kitchen She has now been admitted receiving her Solumedrol treatment in hospital. Per Dr Everlena Cooper, [facility] to cancel the in home order.... Per Morrie Sheldon In home Solumedrol take about a week to have set up.... If they need treatment urgently it can be done at [facility] hospital the next day.Marland KitchenMarland Kitchen

## 2015-07-13 NOTE — Progress Notes (Signed)
Nsg Discharge Note  Admit Date:  07/11/2015 Discharge date: 07/13/2015   [name] to be D/C'd Home per MD order.  AVS completed.  Copy for chart, and copy for patient signed, and dated. Patient/caregiver able to verbalize understanding.  Discharge Medication:   Medication List    TAKE these medications        B-12 PO  Take 1 tablet by mouth daily.     baclofen 10 MG tablet  Commonly known as:  LIORESAL  Take 1 tablet (10 mg total) by mouth daily. at 1 pm     ezetimibe 10 MG tablet  Commonly known as:  ZETIA  Take 10 mg by mouth daily.     Fish Oil 1200 MG Caps  Take 1 capsule by mouth 3 (three) times daily.     gabapentin 300 MG capsule  Commonly known as:  NEURONTIN  Take 2 capsules (600 mg total) by mouth 3 (three) times daily.     LORazepam 0.5 MG tablet  Commonly known as:  ATIVAN  Take 1 tablet (0.5 mg total) by mouth 2 (two) times daily.     meloxicam 15 MG tablet  Commonly known as:  MOBIC  Take 7.5 mg by mouth 2 (two) times daily.     modafinil 200 MG tablet  Commonly known as:  PROVIGIL  Take 1 tablet (200 mg total) by mouth daily.     PARoxetine 25 MG 24 hr tablet  Commonly known as:  PAXIL-CR  Take 25 mg by mouth every morning.     PROBIOTIC DAILY Caps  Take 1 capsule by mouth daily.     REBIF 44 MCG/0.5ML Sosy injection  Generic drug:  interferon beta-1a  INJECT 0.5 ML (44 MCG TOTAL) UNDER THE SKIN THREE TIMES A WEEK     VITAMIN D PO  Take 1 capsule by mouth daily.     VITAMIN E PO  Take 1 tablet by mouth daily.     XIFAXAN 550 MG Tabs tablet  Generic drug:  rifaximin  Take 2 tablets by mouth daily.        Discharge Assessment: Filed Vitals:   07/13/15 1359  BP: 117/65  Pulse: 73  Temp: 98.2 F (36.8 C)  Resp: 16   Skin clean, dry and intact without evidence of skin break down, no evidence of skin tears noted. IV catheter discontinued intact. Site without signs and symptoms of complications - no redness or edema noted at  insertion site, patient denies c/o pain - only slight tenderness at site.  Dressing with slight pressure applied.  D/c Instructions-Education: Discharge instructions given to patient/family with verbalized understanding. D/c education completed with patient/family including follow up instructions, medication list, d/c activities limitations if indicated, with other d/c instructions as indicated by MD - patient able to verbalize understanding, all questions fully answered. Patient instructed to return to ED, call 911, or call MD for any changes in condition.  Patient escorted via WC, and D/C home via private auto.  Kern Reap, RN 07/13/2015 5:47 PM

## 2015-07-13 NOTE — Discharge Summary (Signed)
Physician Discharge Summary  [name]:[id number] DOB: 02/14/1951 DOA: 07/11/2015  PCP: Lupita Raider, MD  Admit date: 07/11/2015 Discharge date: 07/13/2015  Time spent: 40 minutes  Recommendations for Outpatient Follow-up:  1. Follow-up with primary neurologist as outpatient.  Discharge Diagnoses:  Principal Problem:   Multiple sclerosis exacerbation (HCC) Active Problems:   Small intestinal bacterial overgrowth   Peripheral neuropathy (HCC)   HLD (hyperlipidemia)   Diplopia   Discharge Condition: Stable  Diet recommendation: Heart healthy  Filed Weights   07/11/15 1805 07/11/15 1904 07/12/15 0500  Weight: 48.081 kg (106 lb) 48.3 kg (106 lb 7.7 oz) 48 kg (105 lb 13.1 oz)    History of present illness:  This is a 64 year old female patient with past medical history of multiple sclerosis, associated peripheral neuropathy, hyperlipidemia and small intestine bacterial overgrowth. Patient has a long-standing history of relapsing remitting multiple sclerosis and currently takes Rebif subcutaneous 3 times per week in addition to another medication (which she cannot recall the name of at this point). About 2 weeks ago patient initially began noticing some decreased sensation in the anterior tibialis region of her left leg which she does have periodically but typically will go away without any additional treatments. Over the past 2 weeks the weakness has progressed and has developed progressive decreased sensation and motor abilities now involving her left arm. She also is having an exacerbation of her mild chronic waxing and waning dysphagia which she typically manages by taking smaller bites and taking more time to eat. She had followed up with her neurologist on 9/29 and plans were to proceed with outpatient IV Solu-Medrol. Unfortunately patient was unable to get home health Solu-Medrol set up and because of her persistent symptoms her neurologist sent her to the ER to be evaluated  for admission and initiation of high-dose IV daily steroids. In addition to the above she is also complaining of what is typically referred to as "the MS hug" which is a circumferential tightness that can occur during MS exacerbations although she denies frank dyspnea or shortness of breath.  In the ER patient was afebrile, BP was 125/67, pulse 62 and regular respirations 16 and room air saturations 95%. She has not had any laboratory data obtained in the ER. She has been evaluated by neurology here who had started Solu-Medrol IV 1 g 1 dose with 2 more additional doses over the next 2 days.  In addition to the above, patient reports several changes in her medication so her daughter will bring in all of her medications per pharmacist to review before we order the majority of her medications. She was clear on her Zantac and Xifaxan dosages so we have gone ahead and reorder dose.  Hospital Course:     Multiple sclerosis exacerbation -Presented with weakness and tingling in the left upper and lower extremities. -NIF and vital capacity every 4 hours; incentive spirometry every 4 hours -Started on high-dose IV Solu-Medrol, 1 g every 24 hours for 3 days in a row. -Symptoms improved at the time of discharge but still has some tingling and weakness. -The weakness is not affecting her gait or ADLs.   Small intestinal bacterial overgrowth -Continue preadmission rifaximin -Regular diet   Peripheral neuropathy -Previously documented as being on Neurontin; recommend resume usual home medications once clarified -Neurontin restarted.   HLD (hyperlipidemia) -Restarted home medications.   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 -This is being on for the past several month, likely secondary  to MS as well, per neurology rule out temporal arteritis.  Code Status: Full Code  Procedures:  None  Consultations:  Neuro  Discharge Exam: Filed  Vitals:   07/13/15 0511  BP: 115/72  Pulse: 60  Temp: 97.8 F (36.6 C)  Resp: 14   General: Alert and awake, oriented x3, not in any acute distress. HEENT: anicteric sclera, pupils reactive to light and accommodation, EOMI CVS: S1-S2 clear, no murmur rubs or gallops Chest: clear to auscultation bilaterally, no wheezing, rales or rhonchi Abdomen: soft nontender, nondistended, normal bowel sounds, no organomegaly Extremities: no cyanosis, clubbing or edema noted bilaterally Neuro: Cranial nerves II-XII intact, left lateral gaze diplopia, no focal neurological deficits,  Discharge Instructions   Discharge Instructions    Diet - low sodium heart healthy    Complete by:  As directed      Increase activity slowly    Complete by:  As directed           Current Discharge Medication List    CONTINUE these medications which have NOT CHANGED   Details  baclofen (LIORESAL) 10 MG tablet Take 1 tablet (10 mg total) by mouth daily. at 1 pm Qty: 90 tablet, Refills: 1    Cholecalciferol (VITAMIN D PO) Take 1 capsule by mouth daily.     Cyanocobalamin (B-12 PO) Take 1 tablet by mouth daily.    ezetimibe (ZETIA) 10 MG tablet Take 10 mg by mouth daily.    gabapentin (NEURONTIN) 300 MG capsule Take 2 capsules (600 mg total) by mouth 3 (three) times daily. Qty: 540 capsule, Refills: 1    LORazepam (ATIVAN) 0.5 MG tablet Take 1 tablet (0.5 mg total) by mouth 2 (two) times daily. Qty: 180 tablet, Refills: 1    meloxicam (MOBIC) 15 MG tablet Take 7.5 mg by mouth 2 (two) times daily.    Omega-3 Fatty Acids (FISH OIL) 1200 MG CAPS Take 1 capsule by mouth 3 (three) times daily.     PARoxetine (PAXIL-CR) 25 MG 24 hr tablet Take 25 mg by mouth every morning. Refills: 6    Probiotic Product (PROBIOTIC DAILY) CAPS Take 1 capsule by mouth daily.    REBIF 44 MCG/0.5ML SOSY injection INJECT 0.5 ML (44 MCG TOTAL) UNDER THE SKIN THREE TIMES A WEEK Qty: 36 Syringe, Refills: 1    rifaximin  (XIFAXAN) 550 MG TABS tablet Take 2 tablets by mouth daily.     VITAMIN E PO Take 1 tablet by mouth daily.    modafinil (PROVIGIL) 200 MG tablet Take 1 tablet (200 mg total) by mouth daily. Qty: 30 tablet, Refills: 3       Allergies  Allergen Reactions  . Celebrex [Celecoxib] Itching and Swelling    Throat swelling  . Red Yeast Rice [Cholestin] Itching and Swelling    Throat swelling  . Zocor [Simvastatin] Itching and Swelling    Throat swelling  . Achromycin [Tetracycline] Hives  . Codeine Sulfate Nausea And Vomiting  . Welchol [Colesevelam Hcl] Nausea And Vomiting      The results of significant diagnostics from this hospitalization (including imaging, microbiology, ancillary and laboratory) are listed below for reference.    Significant Diagnostic Studies: Mr Lodema Pilot Contrast  Aug 11, 2015   CLINICAL DATA:  Progressive LEFT-sided weakness for a few days, similar symptoms with prior multiple sclerosis flares. History of relapsing remitting multiple sclerosis, hyperlipidemia.  EXAM: MRI HEAD WITHOUT AND WITH CONTRAST  TECHNIQUE: Multiplanar, multiecho pulse sequences of the brain and surrounding structures were obtained  without and with intravenous contrast.  CONTRAST:  22mL MULTIHANCE GADOBENATE DIMEGLUMINE 529 MG/ML IV SOLN  COMPARISON:  MRI of the brain June 04, 2015  FINDINGS: The ventricles and sulci are normal for patient's age. At least 6 ovoid supratentorial white matter subcentimeter T2 hyperintensities, many of which radiate from the periventricular margin, with low T1 signal compatible with black holes of demyelination. No new white matter lesions. No mass lesions, mass effect. No abnormal parenchymal enhancement. No reduced diffusion to suggest acute ischemia nor hyperacute demyelination. No susceptibility artifact to suggest hemorrhage.  No abnormal extra-axial fluid collections. No extra-axial masses nor leptomeningeal enhancement. Normal major intracranial vascular flow  voids seen at the skull base.  Ocular globes and orbital contents are unremarkable though not tailored for evaluation. No suspicious calvarial bone marrow signal. No abnormal sellar expansion. Craniocervical junction maintained. Visualized paranasal sinuses and mastoid air cells are well-aerated.  IMPRESSION: No acute intracranial process.  Stable less than 10 supratentorial white matter lesions compatible with history of multiple sclerosis without acute inflammation/demyelination. No parenchymal brain volume loss for age.   Electronically Signed   By: Awilda Metro M.D.   On: 07/12/2015 00:13    Microbiology: No results found for this or any previous visit (from the past 240 hour(s)).   Labs: Basic Metabolic Panel:  Recent Labs Lab 07/11/15 1728 07/13/15 0615  NA 142 139  K 3.6 4.8  CL 108 105  CO2 28 29  GLUCOSE 92 148*  BUN 21* 21*  CREATININE 0.55 0.65  CALCIUM 9.0 9.6  PHOS  --  4.5   Liver Function Tests:  Recent Labs Lab 07/11/15 1728 07/13/15 0615  AST 32  --   ALT 28  --   ALKPHOS 41  --   BILITOT 0.7  --   PROT 5.7*  --   ALBUMIN 3.5 3.7   No results for input(s): LIPASE, AMYLASE in the last 168 hours. No results for input(s): AMMONIA in the last 168 hours. CBC:  Recent Labs Lab 07/11/15 1728 07/13/15 0615  WBC 5.2 8.5  NEUTROABS 2.8  --   HGB 12.1 12.6  HCT 37.2 37.3  MCV 92.3 91.4  PLT 146* 148*   Cardiac Enzymes: No results for input(s): CKTOTAL, CKMB, CKMBINDEX, TROPONINI in the last 168 hours. BNP: BNP (last 3 results) No results for input(s): BNP in the last 8760 hours.  ProBNP (last 3 results) No results for input(s): PROBNP in the last 8760 hours.  CBG: No results for input(s): GLUCAP in the last 168 hours.     Signed:  Skyy Mcknight A  [facility] 07/13/2015, 1:49 PM

## 2015-07-14 ENCOUNTER — Telehealth: Payer: Self-pay | Admitting: Neurology

## 2015-07-14 DIAGNOSIS — Z7409 Other reduced mobility: Secondary | ICD-10-CM

## 2015-07-14 NOTE — Telephone Encounter (Signed)
Please advise 

## 2015-07-14 NOTE — Telephone Encounter (Signed)
Pt called to request a note for work to be out at least 2 more weeks/ call back @ [phone]

## 2015-07-15 ENCOUNTER — Encounter: Payer: Self-pay | Admitting: Neurology

## 2015-07-15 NOTE — Telephone Encounter (Signed)
Letter drafted

## 2015-07-17 ENCOUNTER — Ambulatory Visit: Payer: BLUE CROSS/BLUE SHIELD | Attending: Internal Medicine | Admitting: Physical Therapy

## 2015-07-17 ENCOUNTER — Ambulatory Visit: Payer: BLUE CROSS/BLUE SHIELD

## 2015-07-17 ENCOUNTER — Encounter: Payer: Self-pay | Admitting: Physical Therapy

## 2015-07-17 DIAGNOSIS — M79629 Pain in unspecified upper arm: Secondary | ICD-10-CM | POA: Insufficient documentation

## 2015-07-17 DIAGNOSIS — R531 Weakness: Secondary | ICD-10-CM | POA: Diagnosis present

## 2015-07-17 DIAGNOSIS — Z7409 Other reduced mobility: Secondary | ICD-10-CM | POA: Insufficient documentation

## 2015-07-17 DIAGNOSIS — R42 Dizziness and giddiness: Secondary | ICD-10-CM | POA: Insufficient documentation

## 2015-07-17 DIAGNOSIS — R2681 Unsteadiness on feet: Secondary | ICD-10-CM | POA: Diagnosis present

## 2015-07-17 DIAGNOSIS — R279 Unspecified lack of coordination: Secondary | ICD-10-CM | POA: Insufficient documentation

## 2015-07-17 DIAGNOSIS — H539 Unspecified visual disturbance: Secondary | ICD-10-CM | POA: Diagnosis present

## 2015-07-17 DIAGNOSIS — R29898 Other symptoms and signs involving the musculoskeletal system: Secondary | ICD-10-CM | POA: Diagnosis present

## 2015-07-17 DIAGNOSIS — M6281 Muscle weakness (generalized): Secondary | ICD-10-CM | POA: Insufficient documentation

## 2015-07-17 DIAGNOSIS — R269 Unspecified abnormalities of gait and mobility: Secondary | ICD-10-CM | POA: Diagnosis not present
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
## 2015-07-20 ENCOUNTER — Encounter: Payer: Self-pay | Admitting: Physical Therapy

## 2015-07-20 ENCOUNTER — Ambulatory Visit: Payer: BLUE CROSS/BLUE SHIELD | Admitting: Physical Therapy

## 2015-07-20 DIAGNOSIS — R29898 Other symptoms and signs involving the musculoskeletal system: Secondary | ICD-10-CM

## 2015-07-20 DIAGNOSIS — R269 Unspecified abnormalities of gait and mobility: Secondary | ICD-10-CM | POA: Diagnosis not present
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
## 2015-07-20 NOTE — Patient Instructions (Signed)
Bridging    Slowly raise buttocks from floor, keeping stomach tight. Repeat ___10_ times per set. Do _1-2Ankle Bend: Dorsiflexion / Plantar Flexion, Sitting    Sit with feet on floor. Point toes up, keeping both heels on floor. Then press toes to floor raising heels. Hold each position _5__ seconds. Repeat __10_ times per session. Do _1-2__ sessions per day.  USE GREEN BAND ON TOP OF LEFT FOOT  Copyright  VHI. All rights reserved.  _ sets per session. Do _1 HIP: Abduction - Side-Lying    Lie on side, legs straight and in line with trunk. Squeeze glutes. Raise top leg up and slightly back. Point toes forward. _10__ reps per set, _1-2__ sets per day, _5 days per week Bend bottom leg to stabilize pelvis.  Copyright  VHI. All rights reserved.  KNEE: Flexion - Prone    Bend knee. Raise heel toward buttocks. Do not raise hips. __10_ reps per set, _1-2__ sets per day, _5__ days per week   Copyright  VHI. All rights reserved.  Straight Leg Raise    Tighten stomach and slowly raise locked right leg _12___ inches from floor. Repeat _10___ times per set. Do _1___ sets per session. Do _1-2___ sessions per day.  http://orth.exer.us/1103   Copyright  VHI. All rights reserved.  ___ sessions per day.  http://orth.exer.us/1097   Copyright  VHI. All rights reserved.

## 2015-07-22 ENCOUNTER — Ambulatory Visit: Payer: BLUE CROSS/BLUE SHIELD | Admitting: Rehabilitation

## 2015-07-23 ENCOUNTER — Telehealth: Payer: Self-pay | Admitting: Neurology

## 2015-07-23 NOTE — Telephone Encounter (Signed)
Pt called to inform that she is having SOB/bk pn/feels like due to the steroids?/call back @ [phone]

## 2015-07-23 NOTE — Telephone Encounter (Signed)
I called patient she states that she's started to have a feeling again of SOB when she tries to take a deep breath, she states that when she does this she does get some pain as well in her back. She states that she is not having any other sxs related to this. She also states that she feels some increased exertion when walking. When speaking with her she didn't sound like she was in distress. No new medications have been started, the only MS medicine she is doing now is Rebif.

## 2015-07-23 NOTE — Telephone Encounter (Signed)
Patient was notified of advisement. She verbalized good understanding.

## 2015-07-23 NOTE — Telephone Encounter (Signed)
If this is similar to symptoms in past, see if symptoms go away similar to before. If they worsen or do not go away in the next few hours, go to ER. Thanks

## 2015-07-24 ENCOUNTER — Ambulatory Visit: Payer: BLUE CROSS/BLUE SHIELD

## 2015-07-27 ENCOUNTER — Encounter: Payer: Self-pay | Admitting: Rehabilitation

## 2015-07-27 ENCOUNTER — Ambulatory Visit: Payer: BLUE CROSS/BLUE SHIELD | Admitting: Rehabilitation

## 2015-07-27 DIAGNOSIS — R269 Unspecified abnormalities of gait and mobility: Secondary | ICD-10-CM

## 2015-07-27 DIAGNOSIS — R531 Weakness: Secondary | ICD-10-CM

## 2015-07-27 DIAGNOSIS — R29898 Other symptoms and signs involving the musculoskeletal system: Secondary | ICD-10-CM

## 2015-07-27 DIAGNOSIS — R2681 Unsteadiness on feet: Secondary | ICD-10-CM
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
## 2015-07-27 NOTE — Patient Instructions (Addendum)
Fall Prevention in the Home  Falls can cause injuries and can affect people from all age groups. There are many simple things that you can do to make your home safe and to help prevent falls. WHAT CAN I DO ON THE OUTSIDE OF MY HOME?   Regularly repair the edges of walkways and driveways and fix any cracks.  Remove high doorway thresholds.  Trim any shrubbery on the main path into your home.  Use bright outdoor lighting.  Clear walkways of debris and clutter, including tools and rocks.  Regularly check that handrails are securely fastened and in good repair. Both sides of any steps should have handrails.  Install guardrails along the edges of any raised decks or porches.  Have leaves, snow, and ice cleared regularly.  Use sand or salt on walkways during winter months.  In the garage, clean up any spills right away, including grease or oil spills.  WHAT CAN I DO IN THE BATHROOM?  Use night lights.  Install grab bars by the toilet and in the tub and shower. Do not use towel bars as grab bars.  Use non-skid mats or decals on the floor of the tub or shower.  If you need to sit down while you are in the shower, use a plastic, non-slip stool.Marland Kitchen  Keep the floor dry. Immediately clean up any water that spills on the floor.  Remove soap buildup in the tub or shower on a regular basis.   Attach bath mats securely with double-sided non-slip rug tape.  Remove throw rugs and other tripping hazards from the floor. WHAT CAN I DO IN THE BEDROOM?  Use night lights.   Make sure that a bedside light is easy to reach.  Do not use oversized bedding that drapes onto the floor.  Have a firm chair that has side arms to use for getting dressed.  Remove throw rugs and other tripping hazards from the floor. WHAT CAN I DO IN THE KITCHEN?   Clean up any spills right away.  Avoid walking on wet floors.  Place frequently used items in easy-to-reach places.  If you need to reach for  something above you, use a sturdy step stool that has a grab bar.  Keep electrical cables out of the way.  Do not use floor polish or wax that makes floors slippery. If you have to use wax, make sure that it is non-skid floor wax.  Remove throw rugs and other tripping hazards from the floor. WHAT CAN I DO IN THE STAIRWAYS?  Do not leave any items on the stairs.  Make sure that there are handrails on both sides of the stairs. Fix handrails that are broken or loose. Make sure that handrails are as long as the stairways.  Check any carpeting to make sure that it is firmly attached to the stairs. Fix any carpet that is loose or worn.  Avoid having throw rugs at the top or bottom of stairways, or secure the rugs with carpet tape to prevent them from moving.  Make sure that you have a light switch at the top of the stairs and the bottom of the stairs. If you do not have them, have them installed. WHAT ARE SOME OTHER FALL PREVENTION TIPS?  Wear closed-toe shoes that fit well and support your feet. Wear shoes that have rubber soles or low heels.  When you use a stepladder, make sure that it is completely opened and that the sides are firmly locked. Have someone hold  the ladder while you are using it. Do not climb a closed stepladder.   Add color or contrast paint or tape to grab bars and handrails in your home. Place contrasting color strips on the first and last steps.  Use mobility aids as needed, such as canes, walkers, scooters, and crutches.  Turn on lights if it is dark. Replace any light bulbs that burn out.  Set up furniture so that there are clear paths. Keep the furniture in the same spot.  Fix any uneven floor surfaces.  Choose a carpet design that does not hide the edge of steps of a stairway.  Be aware of any and all pets.   Review your medicines with your healthcare provider. Some medicines can cause dizziness or changes in blood pressure, which increase your risk of  falling. Talk with your health care provider about other ways that you can decrease your risk of falls. This may include working with a physical therapist or trainer to improve your strength, balance, and endurance.   This information is not intended to replace advice given to you by your health care provider. Make sure you discuss any questions you have with your health care provider.   Document Released: 09/16/2002 Document Revised: 02/10/2015 Document Reviewed: 10/31/2014 Elsevier Interactive Patient Education 2016 [employer].    Bridging    Slowly raise buttocks from floor, keeping stomach tight. Repeat ___10_ times per set. Do _1-2Ankle Bend: Dorsiflexion / Plantar Flexion, Sitting    Sit with feet on floor. Point toes up, keeping both heels on floor. Then press toes to floor raising heels. Hold each position _5__ seconds. Repeat __10_ times per session. Do _1-2__ sessions per day. USE GREEN BAND ON TOP OF LEFT FOOT  Copyright  VHI. All rights reserved.  _ sets per session. Do _1 HIP: Abduction - Side-Lying    Lie on side, legs straight and in line with trunk. Squeeze glutes. Raise top leg up and slightly back. Point toes forward. _10__ reps per set, _1-2__ sets per day, _5 days per week Bend bottom leg to stabilize pelvis.  Copyright  VHI. All rights reserved.  KNEE: Flexion - Prone    Bend knee. Raise heel toward buttocks. Do not raise hips. __10_ reps per set, _1-2__ sets per day, _5__ days per week   Copyright  VHI. All rights reserved.  Straight Leg Raise    Tighten stomach and slowly raise locked right leg _12___ inches from floor. Repeat _10___ times per set. Do _1___ sets per session. Do _1-2___ sessions per day.    Hamstring Stretch, Seated (Strap, Two Chairs)    Sit with one leg extended onto facing chair. Loop strap over outstretched foot at ball of big toe. Lengthen spine. Hold for __60__ secs. Repeat __2__ times each leg.  Copyright   VHI. All rights reserved.   Achilles / Gastroc, Standing    Stand, right foot behind, heel on floor and turned slightly out, leg straight, forward leg bent. Move hips forward. Hold ___ seconds. Repeat ___ times per session. Do ___ sessions per day.  Copyright  VHI. All rights reserved.

## 2015-07-28 ENCOUNTER — Encounter: Payer: Self-pay | Admitting: Neurology

## 2015-07-28 ENCOUNTER — Other Ambulatory Visit: Payer: Self-pay | Admitting: *Deleted

## 2015-07-28 ENCOUNTER — Ambulatory Visit (INDEPENDENT_AMBULATORY_CARE_PROVIDER_SITE_OTHER): Payer: BLUE CROSS/BLUE SHIELD | Admitting: Neurology

## 2015-07-28 VITALS — BP 100/64 | HR 67 | Ht 65.0 in | Wt 97.1 lb

## 2015-07-28 DIAGNOSIS — G35 Multiple sclerosis: Secondary | ICD-10-CM

## 2015-07-28 MED ORDER — DALFAMPRIDINE ER 10 MG PO TB12: 10.0000 mg | ORAL_TABLET | Freq: Two times a day (BID) | ORAL | Status: DC

## 2015-07-28 NOTE — Progress Notes (Signed)
Note routed

## 2015-07-28 NOTE — Patient Instructions (Signed)
1.  We will refer you for a second opinion regarding switching medications.  We will try Dr. Leona Carry (in [place]) or Dr. Harlen Labs (at [facility])- whichever is the soonest appointment. 2.  Review literature of alternative medications 3.  Will start Ampyra 10mg  every 12 hours to help with walking 4.  Let me know what needs to be put in a letter for work 5.  Continue PT.  Ask about need for occupational therapy as well 6.  Follow up in 4 weeks.

## 2015-07-28 NOTE — Progress Notes (Signed)
NEUROLOGY FOLLOW UP OFFICE NOTE  [name] [id number]  HISTORY OF PRESENT ILLNESS: [name] is a 64 year old right-handed woman with IBS and anxiety who follows up for relapsing-remitting multiple sclerosis.  History obtained by patient and hospital admission.  Images of brain MRI and labs reviewed.  UPDATE: 3 weeks ago, she had an MS flare-up with left sided numbness and tingling, as well as weakness of the left foot.  She also reported "MS hug".  She was admitted to the hospital from 10/1 to 10/3 for 3 days of IV Solu-Medrol 1gm and underwent home PT.  She also reported temporal headache and double vision.  Sed Rate was 7.  MRI of the brain with and without contrast showed stable less than 10 supratentorial white matter lesions without evidence of acute demyelination.  Labs from 07/11/15 showed CBC with WBC 5.2 with ALC 2.0, Hgb 12.1, HCT 37.2 and PLT 146.  CMP was unremarkable.    She continues to have problems with walking and feels a little weak in the left hand.  She has been out of work.  HISTORY: In 1977, she developed numbness in the left hand, arm and leg.  She was diagnosed with MS after undergoing CT of head and lumbar puncture.  She was treated with IV ACTH for 10 days.  She has had very mild flare-ups since then, such as facial numbness.  She has required steroids approximately 3 times.  They usually will present with increased gait instability, left sided numbness or leg weakness.  They typically last greater than 1 week.  Her last flare-up was in 2013, in which she needed to receive IV steroids in the hospital.  She had residual weakness for 3 months afterwards.  Her chronic symptoms include fatigue, mild gait instability with occasional falls, left leg numbness, pain, episodic vertigo, or urinary/bowel incontinence.  Occasional spasms in feet or hands.  She also reports occasional bilateral kinetic and postural tremor.  She has history of migraine headaches.  In April  2015, she developed a different left temporal headache.  Sed Rate was 2.  It resolved spontaneously.  Current disease modifying agent:  Rebif (for about 8-10 years) Prior disease modifying agents:  none  Other current medication:  gabapentin  twice daily , baclofen  as needed, Provigil .  She also takes vitamin D 1000-2000 IU daily Prior medications:  Amantadine for fatigue (GI upset)  MRI of brain with and without contrast from 11/20/14 showed approximately 5-6 small sub-centimeter right hemispheric hyperintensities without abnormal enhancement.  Cervical spinal showed no cord lesions.  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  PAST MEDICAL HISTORY: Past Medical History  Diagnosis Date  . Multiple sclerosis (HCC)   . Anxiety   . IBS (irritable bowel syndrome)     MEDICATIONS: Current Outpatient Prescriptions on File Prior to Visit  Medication Sig Dispense Refill  . baclofen (LIORESAL) 10 MG tablet Take 1 tablet (10 mg total) by mouth daily. at 1 pm (Patient taking differently: Take 10 mg by mouth daily as needed. at 1 pm) 90 tablet 1  . Cholecalciferol (VITAMIN D PO) Take 1 capsule by mouth daily.     . Cyanocobalamin (B-12 PO) Take 1 tablet by mouth daily.    Marland Kitchen ezetimibe (ZETIA) 10 MG tablet Take 10 mg by mouth daily.    Marland Kitchen gabapentin (NEURONTIN) 300 MG capsule Take 2 capsules (600 mg total) by mouth 3 (three) times daily. (Patient taking differently: Take  300 mg by mouth 2 (two) times daily. ) 540 capsule 1  . LORazepam (ATIVAN) 0.5 MG tablet Take 1 tablet (0.5 mg total) by mouth 2 (two) times daily. 180 tablet 1  . meloxicam (MOBIC) 15 MG tablet Take 7.5 mg by mouth 2 (two) times daily.    . modafinil (PROVIGIL) 200 MG tablet Take 1 tablet (200 mg total) by mouth daily. 30 tablet 3  . Omega-3 Fatty Acids (FISH OIL) 1200 MG CAPS Take 1 capsule by mouth 3 (three) times daily.     Marland Kitchen PARoxetine (PAXIL-CR) 25 MG 24 hr tablet Take 25 mg by mouth every morning.  6    . Probiotic Product (PROBIOTIC DAILY) CAPS Take 1 capsule by mouth daily.    Marland Kitchen REBIF 44 MCG/0.5ML SOSY injection INJECT 0.5 ML (44 MCG TOTAL) UNDER THE SKIN THREE TIMES A WEEK 36 Syringe 1  . rifaximin (XIFAXAN) 550 MG TABS tablet Take 2 tablets by mouth daily.     Marland Kitchen VITAMIN E PO Take 1 tablet by mouth daily.     No current facility-administered medications on file prior to visit.    ALLERGIES: Allergies  Allergen Reactions  . Celebrex [Celecoxib] Itching and Swelling    Throat swelling  . Red Yeast Rice [Cholestin] Itching and Swelling    Throat swelling  . Zocor [Simvastatin] Itching and Swelling    Throat swelling  . Achromycin [Tetracycline] Hives  . Codeine Sulfate Nausea And Vomiting  . Welchol [Colesevelam Hcl] Nausea And Vomiting    FAMILY HISTORY: Family History  Problem Relation Age of Onset  . Alzheimer's disease Mother   . Endometriosis Daughter   . Endometriosis Daughter     SOCIAL HISTORY: Social History   Social History  . Marital Status: Married    Spouse Name: Jonny Ruiz  . Number of Children: 3  . Years of Education: BA   Occupational History  . Teacher   .     Social History Main Topics  . Smoking status: Never Smoker   . Smokeless tobacco: Never Used  . Alcohol Use: Yes     Comment: occasionally 1 glass of wine  . Drug Use: No  . Sexual Activity: Not on file   Other Topics Concern  . Not on file   Social History Narrative   Pt lives at home with her spouse.   Caffeine Use: occasionally     REVIEW OF SYSTEMS: Constitutional: No fevers, chills, or sweats, no generalized fatigue, change in appetite Eyes: No visual changes, double vision, eye pain Ear, nose and throat: No hearing loss, ear pain, nasal congestion, sore throat Cardiovascular: No chest pain, palpitations Respiratory:  No shortness of breath at rest or with exertion, wheezes GastrointestinaI: No nausea, vomiting, diarrhea, abdominal pain, fecal incontinence Genitourinary:  No  dysuria, urinary retention or frequency Musculoskeletal:  No neck pain, back pain Integumentary: No rash, pruritus, skin lesions Neurological: as above Psychiatric: No depression, insomnia, anxiety Endocrine: No palpitations, fatigue, diaphoresis, mood swings, change in appetite, change in weight, increased thirst Hematologic/Lymphatic:  No anemia, purpura, petechiae. Allergic/Immunologic: no itchy/runny eyes, nasal congestion, recent allergic reactions, rashes  PHYSICAL EXAM: Filed Vitals:   07/28/15 0741  BP: 100/64  Pulse: 67   General: No acute distress.  Patient appears well-groomed.   Head:  Normocephalic/atraumatic Eyes:  Fundoscopic exam unremarkable without vessel changes, exudates, hemorrhages or papilledema. Neck: supple, no paraspinal tenderness, full range of motion Heart:  Regular rate and rhythm Lungs:  Clear to auscultation bilaterally Back: No  paraspinal tenderness Neurological Exam: alert and oriented to person, place, and time. Attention span and concentration intact, recent and remote memory intact, fund of knowledge intact.  Speech fluent and not dysarthric, language intact.  CN II-XII intact. Fundoscopic exam unremarkable without vessel changes, exudates, hemorrhages or papilledema.  Bulk and tone normal, 4+ left deltoid.  5-/5 left wrist extension, triceps, and hip flexion. Otherwise, muscle strength 5/5 throughout.  Reduced pinprick sensation in the left leg up to just above the knee.  She has hyperesthesia of the left foot.  Vibration sensation intact.  Deep tendon reflexes 3+ in lower extremities, 2+ upper extremities, toes downgoing.  Finger to nose with intention tremor but no dysmetria.  Heel to shin testing intact.  Left limp. She is with cane.  Timed 25 foot walk 18.60 seconds.  Unable to tandem walk.  Romberg negative.  IMPRESSION: Relapsing-remitting MS, with recent flare-up, still with disability in regards to fatigue, strength and ambulation.  She is a fall  risk.  PLAN: 1.  She will continue Rebif for now.  MRI of the brain has demonstrated stability without radiographic evidence of acute demyelination.  I wonder if switching to an oral agent would be a better option.  If she clearly demonstrated acute demyelination on MRI, I wouldn't be so hesitant.  I would like to get a second opinion from an MS specialist. 2.  Her gait is clearly worse, even after 2 weeks status post steroids.  We will prescribe Ampyra. 3.  She is to continue gabapentin, baclofen, vitamin D and provigil. 4.  Given her fatigue, weakness and the fact that she is a fall risk, she should not go back to work at this time. 5.  Follow up in 4 weeks.  Shon Millet, DO  CC:  Lupita Raider, MD

## 2015-07-29 ENCOUNTER — Ambulatory Visit: Payer: BLUE CROSS/BLUE SHIELD | Admitting: Physical Therapy

## 2015-07-29 ENCOUNTER — Telehealth: Payer: Self-pay | Admitting: Neurology

## 2015-07-29 ENCOUNTER — Telehealth: Payer: Self-pay | Admitting: Physical Therapy

## 2015-07-29 DIAGNOSIS — R269 Unspecified abnormalities of gait and mobility: Secondary | ICD-10-CM | POA: Diagnosis not present

## 2015-07-29 DIAGNOSIS — R42 Dizziness and giddiness: Secondary | ICD-10-CM

## 2015-07-29 DIAGNOSIS — G35 Multiple sclerosis: Secondary | ICD-10-CM

## 2015-07-29 DIAGNOSIS — R2681 Unsteadiness on feet: Secondary | ICD-10-CM

## 2015-07-29 NOTE — Telephone Encounter (Signed)
Mel Almond, can we place an order for OT for this patient with MS?  Thanks.

## 2015-07-29 NOTE — Addendum Note (Signed)
Addended by: Sheilah Mins A on: 07/29/2015 12:05 PM   Modules accepted: Orders

## 2015-07-29 NOTE — Telephone Encounter (Signed)
I spoke patient's husband and he said that Rx was not in.  I called pharmacy and they have to send it to the specialty pharmacy and they will overnight it to [facility].  I will call patient in the morning because she is at an appointment right now.

## 2015-07-29 NOTE — Telephone Encounter (Signed)
Dr. Everlena Cooper,  I've been seeing Mrs. [name] for outpatient PT. The patient has mentioned increased difficulty performing ADL's due to fine motor coordination impairments in L hand. Pt would benefit from outpatient OT to address these impairments.  If you agree, please submit an order for outpatient OT.  Thanks so much,  Jorje Guild, PT, DPT [facility] [address] [place], [place], [address] Phone: [phone]   Fax:  [phone] 07/29/2015, 10:41 AM

## 2015-07-29 NOTE — Telephone Encounter (Signed)
Pt has some questions about some other information about other MS medication and also needs to know the states of the rx that was suppose to be called in yesterday (ampyra) [facility] [address] in [place] pt phone number [phone]

## 2015-07-29 NOTE — Telephone Encounter (Signed)
OT orders placed
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
## 2015-07-30 ENCOUNTER — Telehealth: Payer: Self-pay

## 2015-07-30 NOTE — Telephone Encounter (Signed)
Pt scheduled with [facility] for a second opinion on Jan. 17, 2017 @ 2 p.m. With Dr Aretha Parrot. [address] [place]  [id number].  Left message of pt's machine to return call.

## 2015-07-30 NOTE — Telephone Encounter (Signed)
Patient notified

## 2015-07-30 NOTE — Telephone Encounter (Signed)
Pt returned your call/call back @ [phone]

## 2015-07-30 NOTE — Telephone Encounter (Signed)
P.A. For [employer]mp[employer] 12H appoved from 06/29/15 -07/28/18. Case ID [id number].   [employer] - [phone]

## 2015-07-31 NOTE — Telephone Encounter (Signed)
Patient notified.  She will be bring in short term disability form on Monday.

## 2015-08-03 ENCOUNTER — Ambulatory Visit: Payer: BLUE CROSS/BLUE SHIELD | Admitting: Rehabilitation

## 2015-08-03 ENCOUNTER — Encounter: Payer: Self-pay | Admitting: Rehabilitation

## 2015-08-03 DIAGNOSIS — R42 Dizziness and giddiness: Secondary | ICD-10-CM

## 2015-08-03 DIAGNOSIS — R2681 Unsteadiness on feet: Secondary | ICD-10-CM

## 2015-08-03 DIAGNOSIS — R269 Unspecified abnormalities of gait and mobility: Secondary | ICD-10-CM
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
## 2015-08-03 NOTE — Patient Instructions (Signed)
Gaze Stabilization: Standing Feet Apart    Feet shoulder width apart, keeping eyes on target on wall _3-4___ feet away, tilt head down 15-30 and move head side to side for __20__ seconds. Repeat while moving head up and down for _20___ seconds.  Make sure you have a chair beside of you in case you need to stabilize yourself.  Also ensure that you do not increase your dizziness more than 2-3 points (out of 10) Do __2__ sessions per day.   Copyright  VHI. All rights reserved.   Gaze Stabilization: Standing Feet Apart (Compliant Surface)    Stand in corner with chair in front of you, start with feet apart on solid ground, place targets on wall opposite of you ("A" on either side of you), tilt head down 15-30 and move head side to side for _10__reps.  Make sure that the "A" gets in focus before you turn and look at the other "A."  Progress to standing on pillow with feet apart and perform same task x 10 reps side to side.  Again, make sure you stop and rest if you increase dizziness more than 2-3 points.   Do __2_ sessions per day.  Copyright  VHI. All rights reserved.

## 2015-08-04 ENCOUNTER — Telehealth: Payer: Self-pay

## 2015-08-04 NOTE — Telephone Encounter (Signed)
Form completed. Pt aware. Copy in scan box.

## 2015-08-04 NOTE — Telephone Encounter (Signed)
Spoke with patient about Disability paper work. Patient was notified of $25 dollar completion fee. Pt agreed. Paperwork partially completed and put in providers inbox.

## 2015-08-05 ENCOUNTER — Ambulatory Visit: Payer: BLUE CROSS/BLUE SHIELD | Admitting: Occupational Therapy

## 2015-08-05 ENCOUNTER — Ambulatory Visit: Payer: BLUE CROSS/BLUE SHIELD | Admitting: Physical Therapy

## 2015-08-05 DIAGNOSIS — R279 Unspecified lack of coordination: Secondary | ICD-10-CM

## 2015-08-05 DIAGNOSIS — R269 Unspecified abnormalities of gait and mobility: Secondary | ICD-10-CM | POA: Diagnosis not present

## 2015-08-05 DIAGNOSIS — M25529 Pain in unspecified elbow: Secondary | ICD-10-CM

## 2015-08-05 DIAGNOSIS — H539 Unspecified visual disturbance: Secondary | ICD-10-CM

## 2015-08-05 DIAGNOSIS — R2681 Unsteadiness on feet: Secondary | ICD-10-CM

## 2015-08-05 DIAGNOSIS — M6281 Muscle weakness (generalized): Secondary | ICD-10-CM

## 2015-08-05 DIAGNOSIS — R42 Dizziness and giddiness: Secondary | ICD-10-CM

## 2015-08-05 DIAGNOSIS — R531 Weakness: Secondary | ICD-10-CM

## 2015-08-05 DIAGNOSIS — Z7409 Other reduced mobility: Secondary | ICD-10-CM
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
## 2015-08-05 NOTE — Telephone Encounter (Signed)
-----   Message from Drema Dallas, DO sent at 08/05/2015  9:37 AM EDT ----- Regarding: FW: Request for Order for L Foot Up [name],  Could be place an order for a personal L Foot Up brace to increase safety with mobility.  Thank you  ----- Message -----    From: Calvert Cantor, PT    Sent: 08/05/2015   9:06 AM      To: Drema Dallas, DO Subject: Request for Order for L Foot Up                Dr. Everlena Cooper,  [name] has been using a left Foot Up brace in physical therapy for consistent L foot clearance during ambulation. [name] would benefit from a personal L Foot Up brace to increase safety with mobility.  If you agree, please place an order for a left Foot Up brace.  Thanks so much,  Jorje Guild, PT, DPT [facility] [address] [place], [place], [address] Phone: [phone]   Fax:  [phone] 08/05/2015, 9:09 AM
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
## 2015-08-07 ENCOUNTER — Ambulatory Visit
Admission: RE | Admit: 2015-08-07 | Discharge: 2015-08-07 | Disposition: A | Discharge status: Home / Self Care | Payer: BLUE CROSS/BLUE SHIELD | Source: Ambulatory Visit

## 2015-08-07 DIAGNOSIS — Z1231 Encounter for screening mammogram for malignant neoplasm of breast: Secondary | ICD-10-CM

## 2015-08-10 ENCOUNTER — Ambulatory Visit: Payer: BLUE CROSS/BLUE SHIELD | Admitting: Rehabilitation

## 2015-08-10 DIAGNOSIS — R531 Weakness: Secondary | ICD-10-CM

## 2015-08-10 DIAGNOSIS — R269 Unspecified abnormalities of gait and mobility: Secondary | ICD-10-CM

## 2015-08-10 DIAGNOSIS — R2681 Unsteadiness on feet: Secondary | ICD-10-CM

## 2015-08-10 DIAGNOSIS — R42 Dizziness and giddiness: Secondary | ICD-10-CM
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
## 2015-08-10 NOTE — Patient Instructions (Signed)
Feet Apart (Compliant Surface) Head Motion - Eyes Closed    Stand on compliant surface: ____on stacked pillows____ with feet shoulder width apart. Close eyes and move head slowly, up and down. Repeat __10__ times per session. Do _2___ sessions per day.  Repeat with head movements up/down.    Copyright  VHI. All rights reserved.   Feet Together (Compliant Surface) Head Motion - Eyes Open    With eyes open, standing on compliant surface: ____stacked pillows____, feet together, move head slowly: up and down. Repeat __10__ times per session. Do __2__ sessions per day.  Repeat with head motions up and down.  Remember to stop and rest if you feel that you are getting above 3/10 dizziness.    Copyright  VHI. All rights reserved.

## 2015-08-12 ENCOUNTER — Telehealth: Payer: Self-pay

## 2015-08-12 ENCOUNTER — Ambulatory Visit: Payer: BLUE CROSS/BLUE SHIELD | Attending: Internal Medicine | Admitting: Physical Therapy

## 2015-08-12 DIAGNOSIS — R279 Unspecified lack of coordination: Secondary | ICD-10-CM | POA: Diagnosis present

## 2015-08-12 DIAGNOSIS — R4701 Aphasia: Secondary | ICD-10-CM | POA: Diagnosis present

## 2015-08-12 DIAGNOSIS — R29898 Other symptoms and signs involving the musculoskeletal system: Secondary | ICD-10-CM | POA: Diagnosis present

## 2015-08-12 DIAGNOSIS — H539 Unspecified visual disturbance: Secondary | ICD-10-CM | POA: Diagnosis present

## 2015-08-12 DIAGNOSIS — R2681 Unsteadiness on feet: Secondary | ICD-10-CM | POA: Diagnosis present

## 2015-08-12 DIAGNOSIS — R531 Weakness: Secondary | ICD-10-CM | POA: Diagnosis present

## 2015-08-12 DIAGNOSIS — Z7409 Other reduced mobility: Secondary | ICD-10-CM | POA: Diagnosis present

## 2015-08-12 DIAGNOSIS — M6281 Muscle weakness (generalized): Secondary | ICD-10-CM | POA: Insufficient documentation

## 2015-08-12 DIAGNOSIS — R269 Unspecified abnormalities of gait and mobility: Secondary | ICD-10-CM | POA: Diagnosis not present

## 2015-08-12 DIAGNOSIS — R42 Dizziness and giddiness: Secondary | ICD-10-CM | POA: Diagnosis present

## 2015-08-12 DIAGNOSIS — G35 Multiple sclerosis: Secondary | ICD-10-CM

## 2015-08-12 NOTE — Telephone Encounter (Signed)
-----   Message from Drema Dallas, DO sent at 08/12/2015  9:36 AM EDT ----- Regarding: FW: Request for Speech Therapy Jada,  Can we set Ms. Boccio up for outpatient speech therapy?  Thanks. ----- Message -----    From: Calvert Cantor, PT    Sent: 08/12/2015   9:27 AM      To: Drema Dallas, DO Subject: Request for Speech Therapy                     Dr. Everlena Cooper,  Sorry to bother you with another request, but Mrs. Clements was inquiring about speech therapy today due to difficulty with word-finding.  Patient would likely benefit from an outpatient Speech Therapy assessment. If you agree with this, please submit an order for Speech Therapy.  Thank you,   Jorje Guild, PT, DPT [facility] [address] [place], [place], [address] Phone: [phone]   Fax:  [phone] 08/12/2015, 9:29 AM
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
## 2015-08-12 NOTE — Telephone Encounter (Signed)
Order placed

## 2015-08-17 ENCOUNTER — Ambulatory Visit: Payer: BLUE CROSS/BLUE SHIELD | Admitting: Rehabilitation

## 2015-08-19 ENCOUNTER — Ambulatory Visit: Payer: BLUE CROSS/BLUE SHIELD | Admitting: Physical Therapy

## 2015-08-19 ENCOUNTER — Ambulatory Visit: Payer: BLUE CROSS/BLUE SHIELD | Admitting: Speech Pathology

## 2015-08-19 ENCOUNTER — Encounter: Payer: Self-pay | Admitting: Occupational Therapy

## 2015-08-19 ENCOUNTER — Ambulatory Visit: Payer: BLUE CROSS/BLUE SHIELD | Admitting: Occupational Therapy

## 2015-08-19 DIAGNOSIS — R4701 Aphasia: Secondary | ICD-10-CM

## 2015-08-19 DIAGNOSIS — R269 Unspecified abnormalities of gait and mobility: Secondary | ICD-10-CM

## 2015-08-19 DIAGNOSIS — R531 Weakness: Secondary | ICD-10-CM

## 2015-08-19 DIAGNOSIS — R2681 Unsteadiness on feet: Secondary | ICD-10-CM

## 2015-08-19 DIAGNOSIS — M6281 Muscle weakness (generalized): Secondary | ICD-10-CM

## 2015-08-19 DIAGNOSIS — R279 Unspecified lack of coordination: Secondary | ICD-10-CM

## 2015-08-19 NOTE — Patient Instructions (Signed)
Tips for Talking with People who have Aphasia  . Say one thing at a time . Don't  rush - slow down, be patient . Reduce background noise . Relax - be natural . Use pen and paper . Write down key words . Draw diagrams or pictures . Don't pretend you understand . Ask what helps . Recap - check you both understand   Describing words  What group does it belong to?  What do I use it for?  Where can I find it?  What does it LOOK like?  What other words go with it?  What is the 1st sound of the word?  Many Ways to Communicate  Describe it Write it Draw it Gesture it Use related words  There's an App for that: Family Feud, Heads up, Stop-fun categories, What if, Conversation TherAPPy  Easy crossword book  Provided by: [name] ST, [phone]

## 2015-08-19 NOTE — Patient Instructions (Signed)
  Strengthening: Resisted Flexion   Hold tubing with __Rt, then Lt___ arm(s) at side. Pull forward and up. Move shoulder through pain-free range of motion. Repeat __10__ times per set.  Do _1-2_ sessions per day , every other day   Strengthening: Resisted Extension   Hold tubing in _both____ hand(s), arm forward. Pull arm back, elbow straight. Repeat _10___ times per set. Do _1-2___ sessions per day, every other day.   Resisted Horizontal Abduction: Bilateral   Sit or stand, tubing in both hands, arms out in front. Keeping arms straight, pinch shoulder blades together and stretch arms out. Repeat _10___ times per set. Do _1-2___ sessions per day, every other day.   Elbow Flexion: Resisted   With tubing held in __both____ hand(s) and other end secured under foot, curl arm up as far as possible. Repeat _10___ times per set. Do _1-2___ sessions per day, every other day.    Elbow Extension: Resisted   Sit in chair with theraband held at shoulder level and pull down with other hand. Rt, then Lt____ elbow bent. Straighten elbow. Repeat _10___ times per set.  Do _1-2___ sessions per day, every other day.   1. Grip Strengthening (Resistive Putty)   Squeeze putty using thumb and all fingers Lt hand. Repeat _20___ times. Do __2__ sessions per day.   2. Roll putty into tube on table and pinch between each finger and thumb x 10 reps each Lt hand. (Do ring and small finger together with thumb)
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
## 2015-08-19 NOTE — Patient Instructions (Signed)
ANKLE: Dorsiflexion (Band)    Sit at edge of surface. Place band around top of foot. Keeping heel on floor, raise toes of banded foot.  Use red band. Do 10 reps twice a day.  Copyright  VHI. All rights reserved.   Eversion (Eccentric), (Resistance Band)    Push foot outward against resistance band. Use red resistance band.  Do 10 reps twice a day.   http://ecce.exer.us/7   Copyright  VHI. All rights reserved.

## 2015-08-21 ENCOUNTER — Ambulatory Visit: Payer: BLUE CROSS/BLUE SHIELD | Admitting: Occupational Therapy

## 2015-08-21 ENCOUNTER — Encounter: Payer: Self-pay | Admitting: Rehabilitation

## 2015-08-21 ENCOUNTER — Ambulatory Visit: Payer: BLUE CROSS/BLUE SHIELD | Admitting: Rehabilitation

## 2015-08-21 DIAGNOSIS — R279 Unspecified lack of coordination: Secondary | ICD-10-CM

## 2015-08-21 DIAGNOSIS — H539 Unspecified visual disturbance: Secondary | ICD-10-CM

## 2015-08-21 DIAGNOSIS — R2681 Unsteadiness on feet: Secondary | ICD-10-CM

## 2015-08-21 DIAGNOSIS — R269 Unspecified abnormalities of gait and mobility: Secondary | ICD-10-CM

## 2015-08-21 DIAGNOSIS — R42 Dizziness and giddiness: Secondary | ICD-10-CM

## 2015-08-21 DIAGNOSIS — M6281 Muscle weakness (generalized): Secondary | ICD-10-CM
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
## 2015-08-21 NOTE — Patient Instructions (Signed)
Diplopia HEP:  Perform at  2 times per day. Stop if your eye becomes fatigued or hurts and try again later.  1. Hold a small object/card in front of you.  Hold it in the middle at arm's length away.    2. Cover your LEFT eye and look at the object with your RIGHT eye.  3. Slowly move the object side to side in front of you while continuing to watch it with your RIGHT eye.  4.  Remember to keep your head still and only move your eye.  5.  Repeat 5-10 times.  6.  Then, move object up and down while watching it 5-10 times.  7. Cover your RIGHT eye and look at the object with your LEFT eye while you repeat #1-6 above.   8.  Now, uncover both eyes and try to focus on the object while holding it in the middle.  Try to make it 1 image.   9.  If you can, try to hold it for 10-30 sec increasing as able.    10.  Once you can make the image 1 for at least 30 sec in the middle, repeat #1-6 above with both eyes moving slowly and only in the range that you can keep the image 1.   11. Repeat with each eye individually, moving card up and down,   12 . With both  eyes together move card  within the range that you can see 1 object, 5-10 reps

## 2015-08-24 ENCOUNTER — Ambulatory Visit (INDEPENDENT_AMBULATORY_CARE_PROVIDER_SITE_OTHER): Payer: BLUE CROSS/BLUE SHIELD | Admitting: Neurology

## 2015-08-24 ENCOUNTER — Ambulatory Visit: Payer: BLUE CROSS/BLUE SHIELD | Admitting: Physical Therapy

## 2015-08-24 ENCOUNTER — Encounter: Payer: Self-pay | Admitting: Neurology

## 2015-08-24 VITALS — BP 106/64 | HR 70 | Wt 102.2 lb

## 2015-08-24 DIAGNOSIS — G35 Multiple sclerosis: Secondary | ICD-10-CM

## 2015-08-24 NOTE — Progress Notes (Signed)
NEUROLOGY FOLLOW UP OFFICE NOTE  [name] [id number]  HISTORY OF PRESENT ILLNESS: [name] is a 64 year old right-handed woman with IBS and anxiety who follows up for relapsing-remitting multiple sclerosis.  History obtained by patient and hospital admission.  Images of brain MRI and labs reviewed.  UPDATE: She was prescribed Ampyra for gait and still underging PT/OT.  She is using the cane outside of the house.  Her gait and strength is overall improving.  HISTORY: In 1977, she developed numbness in the left hand, arm and leg.  She was diagnosed with MS after undergoing CT of head and lumbar puncture.  She was treated with IV ACTH for 10 days.  She has had very mild flare-ups since then, such as facial numbness.  She has required steroids approximately 3 times.  They usually will present with increased gait instability, left sided numbness or leg weakness.  They typically last greater than 1 week.  Her last flare-up was in 2013, in which she needed to receive IV steroids in the hospital.  She had residual weakness for 3 months afterwards.  Her chronic symptoms include fatigue, mild gait instability with occasional falls, left leg numbness, pain, episodic vertigo, or urinary/bowel incontinence.  Occasional spasms in feet or hands.  She also reports occasional bilateral kinetic and postural tremor.  She has history of migraine headaches.  In April 2015, she developed a different left temporal headache.  Sed Rate was 2.  It resolved spontaneously.  Current disease modifying agent:  Rebif (for about 8-10 years) Prior disease modifying agents:  none  Other current medication:  gabapentin 600mg  twice daily , baclofen 10mg  as needed, Provigil 200mg .  She also takes vitamin D 1000-2000 IU daily Prior medications:  Amantadine for fatigue (GI upset)  MRI of brain with and without contrast from 11/20/14 showed approximately 5-6 small sub-centimeter right hemispheric hyperintensities without  abnormal enhancement.  Cervical spinal showed no cord lesions.  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  In September 2016, she had an MS flare-up with left sided numbness and tingling, as well as weakness of the left foot.  She also reported "MS hug".  She was admitted to the hospital from 10/1 to 10/3 for 3 days of IV Solu-Medrol 1gm and underwent home PT.  She also reported temporal headache and double vision.  Sed Rate was 7.  MRI of the brain with and without contrast showed stable less than 10 supratentorial white matter lesions without evidence of acute demyelination.  PAST MEDICAL HISTORY: Past Medical History  Diagnosis Date  . Multiple sclerosis (HCC)   . Anxiety   . IBS (irritable bowel syndrome)     MEDICATIONS: Current Outpatient Prescriptions on File Prior to Visit  Medication Sig Dispense Refill  . baclofen (LIORESAL) 10 MG tablet Take 1 tablet (10 mg total) by mouth daily. at 1 pm (Patient taking differently: Take 10 mg by mouth daily as needed. at 1 pm) 90 tablet 1  . Cholecalciferol (VITAMIN D PO) Take 1 capsule by mouth daily.     . Cyanocobalamin (B-12 PO) Take 1 tablet by mouth daily.    Marland Kitchen dalfampridine (AMPYRA) 10 MG TB12 Take 1 tablet (10 mg total) by mouth every 12 (twelve) hours. 60 tablet 3  . dicyclomine (BENTYL) 20 MG tablet     . ezetimibe (ZETIA) 10 MG tablet Take 10 mg by mouth daily.    Marland Kitchen gabapentin (NEURONTIN) 300 MG capsule Take 2 capsules (600  mg total) by mouth 3 (three) times daily. (Patient taking differently: Take 300 mg by mouth 2 (two) times daily. ) 540 capsule 1  . LORazepam (ATIVAN) 0.5 MG tablet Take 1 tablet (0.5 mg total) by mouth 2 (two) times daily. 180 tablet 1  . meloxicam (MOBIC) 15 MG tablet Take 7.5 mg by mouth 2 (two) times daily.    . modafinil (PROVIGIL) 200 MG tablet Take 1 tablet (200 mg total) by mouth daily. 30 tablet 3  . Omega-3 Fatty Acids (FISH OIL) 1200 MG CAPS Take 1 capsule by mouth 3 (three) times daily.     Marland Kitchen  PARoxetine (PAXIL-CR) 25 MG 24 hr tablet Take 25 mg by mouth every morning.  6  . Probiotic Product (PROBIOTIC DAILY) CAPS Take 1 capsule by mouth daily.    Marland Kitchen REBIF 44 MCG/0.5ML SOSY injection INJECT 0.5 ML (44 MCG TOTAL) UNDER THE SKIN THREE TIMES A WEEK 36 Syringe 1  . rifaximin (XIFAXAN) 550 MG TABS tablet Take 2 tablets by mouth daily.     . vitamin C (ASCORBIC ACID) 500 MG tablet Take by mouth.    Marland Kitchen VITAMIN E PO Take 1 tablet by mouth daily.     No current facility-administered medications on file prior to visit.    ALLERGIES: Allergies  Allergen Reactions  . Celebrex [Celecoxib] Itching and Swelling    Throat swelling  . Red Yeast Rice [Cholestin] Itching and Swelling    Throat swelling  . Zocor [Simvastatin] Itching and Swelling    Throat swelling  . Achromycin [Tetracycline] Hives  . Codeine Sulfate Nausea And Vomiting  . Welchol [Colesevelam Hcl] Nausea And Vomiting    FAMILY HISTORY: Family History  Problem Relation Age of Onset  . Alzheimer's disease Mother   . Endometriosis Daughter   . Endometriosis Daughter     SOCIAL HISTORY: Social History   Social History  . Marital Status: Married    Spouse Name: Jonny Ruiz  . Number of Children: 3  . Years of Education: BA   Occupational History  . Teacher   .     Social History Main Topics  . Smoking status: Never Smoker   . Smokeless tobacco: Never Used  . Alcohol Use: Yes     Comment: occasionally 1 glass of wine  . Drug Use: No  . Sexual Activity: Not on file   Other Topics Concern  . Not on file   Social History Narrative   Pt lives at home with her spouse.   Caffeine Use: occasionally     REVIEW OF SYSTEMS: Constitutional: No fevers, chills, or sweats, no generalized fatigue, change in appetite Eyes: No visual changes, double vision, eye pain Ear, nose and throat: No hearing loss, ear pain, nasal congestion, sore throat Cardiovascular: No chest pain, palpitations Respiratory:  No shortness of breath  at rest or with exertion, wheezes GastrointestinaI: No nausea, vomiting, diarrhea, abdominal pain, fecal incontinence Genitourinary:  No dysuria, urinary retention or frequency Musculoskeletal:  No neck pain, back pain Integumentary: No rash, pruritus, skin lesions Neurological: as above Psychiatric: No depression, insomnia, anxiety Endocrine: No palpitations, fatigue, diaphoresis, mood swings, change in appetite, change in weight, increased thirst Hematologic/Lymphatic:  No anemia, purpura, petechiae. Allergic/Immunologic: no itchy/runny eyes, nasal congestion, recent allergic reactions, rashes  PHYSICAL EXAM: Filed Vitals:   08/24/15 0939  BP: 106/64  Pulse: 70   General: No acute distress.  Patient appears well-groomed.  normal body habitus. Head:  Normocephalic/atraumatic Eyes:  Fundoscopic exam unremarkable without vessel changes,  exudates, hemorrhages or papilledema. Neck: supple, no paraspinal tenderness, full range of motion Heart:  Regular rate and rhythm Lungs:  Clear to auscultation bilaterally Back: No paraspinal tenderness Neurological Exam: alert and oriented to person, place, and time. Attention span and concentration intact, recent and remote memory intact, fund of knowledge intact.  Speech fluent and not dysarthric, language intact.  CN II-XII intact. Fundoscopic exam unremarkable without vessel changes, exudates, hemorrhages or papilledema.  Bulk and tone normal, 5-/5 left hip flexion and knee extension. Otherwise, muscle strength 5/5 throughout.  Reduced pinprick sensation in the left leg up to just above the knee.  She has hyperesthesia of the left foot and hand.  Vibration sensation intact.  Deep tendon reflexes 3+ in lower extremities, 2+ upper extremities, toes downgoing.  Finger to nose with intention tremor but no dysmetria.  Heel to shin testing intact.  Left limp. She is with cane.  Timed 25 foot walk 10.75 seconds.  Unable to tandem walk.  Romberg  negative.  IMPRESSION: Relapsing-remitting MS  PLAN: Rebif Gabapentin, Provigil, Ampyra, baclofen, D3 She should remain out of work Follow up in January after consult with MS clinic at [facility]  23 minutes spent face to face with patient, over 50% spent discussing management.  Shon Millet, DO  CC:  Lupita Raider, MD

## 2015-08-24 NOTE — Progress Notes (Signed)
Chart forwarded.  

## 2015-08-24 NOTE — Patient Instructions (Signed)
Follow up after appointment with [facility]

## 2015-08-25 ENCOUNTER — Ambulatory Visit: Payer: BLUE CROSS/BLUE SHIELD | Admitting: Physical Therapy

## 2015-08-25 ENCOUNTER — Ambulatory Visit: Payer: BLUE CROSS/BLUE SHIELD | Admitting: Occupational Therapy

## 2015-08-25 DIAGNOSIS — R279 Unspecified lack of coordination: Secondary | ICD-10-CM

## 2015-08-25 DIAGNOSIS — R269 Unspecified abnormalities of gait and mobility: Secondary | ICD-10-CM

## 2015-08-25 DIAGNOSIS — R2681 Unsteadiness on feet: Secondary | ICD-10-CM

## 2015-08-25 DIAGNOSIS — M6281 Muscle weakness (generalized): Secondary | ICD-10-CM

## 2015-08-25 DIAGNOSIS — R42 Dizziness and giddiness: Secondary | ICD-10-CM
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
## 2015-08-25 NOTE — Patient Instructions (Signed)
   Coordination Activities   Perform the following activities for 15 minutes 1-2 times per day with both hand(s).   Rotate ball in fingertips (clockwise and counter-clockwise). Toss ball in air and catch with the same hand. Flip cards 1 at a time as fast as you can. Deal cards with your thumb (Hold deck in hand and push card off top with thumb). Rotate card in hand (clockwise and counter-clockwise). Shuffle cards. Pick up coins one at a time until you get 5-10 in your hand, then move coins from palm to fingertips to stack one at a time. Practice writing and/or typing.
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
## 2015-08-25 NOTE — Patient Instructions (Signed)
Feet Apart (Compliant Surface) Head Motion - Eyes Closed    Stand on compliant surface: __2__on stacked pillows with feet shoulder width apart. Close eyes and move head slowly, up and down; side to side; and diagonally right/up to left/down and left/up to right/down Repeat __10__ times per session. Do _2___ sessions per day.  Copyright  VHI. All rights reserved.   Feet Together (Compliant Surface) Head Motion - Eyes Open - Feet Together    Stand with your back to a corner with a stable chair in front of you.   With eyes open, standing on compliant surface: __2__stacked pillows, feet together, move head slowly: up and down; right/left.   Repeat __10__ times per session. Do __2__ sessions per day. Repeat with head motions up and down. Remember to stop and rest if you feel that you are getting above 3/10 dizziness.   Copyright  VHI. All rights reserved.

## 2015-08-26 ENCOUNTER — Ambulatory Visit: Payer: BLUE CROSS/BLUE SHIELD | Admitting: Physical Therapy

## 2015-08-27 ENCOUNTER — Ambulatory Visit: Payer: BLUE CROSS/BLUE SHIELD | Admitting: Rehabilitation

## 2015-08-27 ENCOUNTER — Encounter: Payer: Self-pay | Admitting: Rehabilitation

## 2015-08-27 ENCOUNTER — Encounter: Payer: Self-pay | Admitting: Occupational Therapy

## 2015-08-27 ENCOUNTER — Ambulatory Visit: Payer: BLUE CROSS/BLUE SHIELD | Admitting: Occupational Therapy

## 2015-08-27 VITALS — BP 130/90

## 2015-08-27 DIAGNOSIS — R42 Dizziness and giddiness: Secondary | ICD-10-CM

## 2015-08-27 DIAGNOSIS — M6281 Muscle weakness (generalized): Secondary | ICD-10-CM

## 2015-08-27 DIAGNOSIS — R2681 Unsteadiness on feet: Secondary | ICD-10-CM

## 2015-08-27 DIAGNOSIS — R269 Unspecified abnormalities of gait and mobility: Secondary | ICD-10-CM | POA: Diagnosis not present

## 2015-08-27 DIAGNOSIS — Z7409 Other reduced mobility: Secondary | ICD-10-CM

## 2015-08-27 DIAGNOSIS — R279 Unspecified lack of coordination: Secondary | ICD-10-CM
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
## 2015-08-31 ENCOUNTER — Ambulatory Visit: Payer: BLUE CROSS/BLUE SHIELD | Admitting: Rehabilitation

## 2015-09-01 ENCOUNTER — Ambulatory Visit: Payer: BLUE CROSS/BLUE SHIELD | Admitting: Physical Therapy

## 2015-09-01 ENCOUNTER — Encounter: Payer: Self-pay | Admitting: Occupational Therapy

## 2015-09-01 ENCOUNTER — Encounter: Payer: Self-pay | Admitting: Physical Therapy

## 2015-09-01 ENCOUNTER — Ambulatory Visit: Payer: BLUE CROSS/BLUE SHIELD | Admitting: Occupational Therapy

## 2015-09-01 DIAGNOSIS — R269 Unspecified abnormalities of gait and mobility: Secondary | ICD-10-CM | POA: Diagnosis not present

## 2015-09-01 DIAGNOSIS — M6281 Muscle weakness (generalized): Secondary | ICD-10-CM

## 2015-09-01 DIAGNOSIS — R29898 Other symptoms and signs involving the musculoskeletal system: Secondary | ICD-10-CM

## 2015-09-01 DIAGNOSIS — Z7409 Other reduced mobility: Secondary | ICD-10-CM
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
## 2015-09-02 ENCOUNTER — Ambulatory Visit: Payer: BLUE CROSS/BLUE SHIELD | Admitting: Physical Therapy

## 2015-09-02 DIAGNOSIS — R269 Unspecified abnormalities of gait and mobility: Secondary | ICD-10-CM | POA: Diagnosis not present

## 2015-09-02 DIAGNOSIS — R42 Dizziness and giddiness: Secondary | ICD-10-CM

## 2015-09-02 DIAGNOSIS — R2681 Unsteadiness on feet: Secondary | ICD-10-CM

## 2015-09-02 DIAGNOSIS — R29898 Other symptoms and signs involving the musculoskeletal system: Secondary | ICD-10-CM

## 2015-09-02 DIAGNOSIS — M6281 Muscle weakness (generalized): Secondary | ICD-10-CM

## 2015-09-02 NOTE — Patient Instructions (Addendum)
Bridging    Slowly raise buttocks from floor, keeping stomach tight. Repeat ___15_ times per set. Do _1-2 sets per day.   _ sets per session. Do _1 HIP: Abduction - Side-Lying    Lie on side, legs straight and in line with trunk. Squeeze glutes. Raise top leg up and slightly back. Point toes forward. _15__ reps per set, _1-2__ sets per day, _5 days per week Bend bottom leg to stabilize pelvis.    Copyright  VHI. All rights reserved.  Straight Leg Raise    Tighten stomach and slowly raise locked right leg _12___ inches from floor. Repeat _15_ times per set. Do _1-2___ sessions per day.   Toe / Heel Raise (Standing)    Standing with one hand support on countertop, raise heels, then rock back on heels and raise toes. Repeat __20__ times.  Copyright  VHI. All rights reserved.   Mini Squat: Single Leg    Stand on right foot. Use two hands to hold on. Do a mini squat. Knees do not go past toes. Repeat _10__ times. Repeat with other leg for set. Do __1-2_ sets per day.    Gaze Stabilization: Tip Card  1.Target must remain in focus, not blurry, and appear stationary while head is in motion. 2.Perform exercises with small head movements (45 to either side of midline). 3.Increase speed of head motion so long as target is in focus. 4.If you wear eyeglasses, be sure you can see target through lens (therapist will give specific instructions for bifocal / progressive lenses). 5.These exercises may provoke dizziness or nausea. Work through these symptoms. If too dizzy, slow head movement slightly. Rest between each exercise. 6.Exercises demand concentration; avoid distractions. 7.For safety, perform standing exercises close to a counter, wall, corner, or next to someone.  * If your dizziness increases by 3 or more points from baseline, stop and rest (or walk around cane until it returns to baseline). * When turning head side to side, move within range in which  there is only one "A".   Gaze Stabilization: Standing Feet Apart    Feet shoulder width apart, keeping eyes on target on wall _6__ feet away, tilt head down 15-30 and move head side to side for __30__ seconds. Repeat while moving head up and down for _30___ seconds. Make sure you have a chair beside of you in case you need to stabilize yourself. Also ensure that you do not increase your dizziness more than 2-3 points (out of 10) Do __2__ sessions per day.   Copyright  VHI. All rights reserved.   Gaze Stabilization: Standing Feet Apart (Compliant Surface)    Stand in corner with chair in front of you, start with feet apart on solid ground, place targets on wall opposite of you ("A" on either side of you), tilt head down 15-30 and move head side to side for _10__reps. Make sure that the "A" gets in focus before you turn and look at the other "A." Progress to standing on pillow with feet apart and perform same task x 10 reps side to side. Again, make sure you stop and rest if you increase dizziness more than 2-3 points.  Do __2_ sessions per day.   Feet Apart (Compliant Surface) Head Motion - Eyes Closed    Stand on compliant surface: __2__on stacked pillows with feet shoulder width apart. Close eyes and move head slowly, up and down; side to side; and diagonally right/up to left/down and left/up to right/down  Repeat __10__ times per session. Do _2___ sessions per day.    Feet Together (Compliant Surface) Head Motion - Eyes Open - Feet Together    Stand with your back to a corner with a stable chair in front of you.   With eyes open, standing on compliant surface: __2__stacked pillows, feet together, move head slowly: up and down; right/left.   Repeat __10__ times per session. Do __2__ sessions per day. Repeat with head motions up and down. Remember to stop and rest if you feel that you are getting above 3/10 dizziness.
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
## 2015-09-07 ENCOUNTER — Ambulatory Visit: Payer: BLUE CROSS/BLUE SHIELD | Admitting: Occupational Therapy

## 2015-09-07 ENCOUNTER — Ambulatory Visit: Payer: BLUE CROSS/BLUE SHIELD | Admitting: Physical Therapy

## 2015-09-07 DIAGNOSIS — R29898 Other symptoms and signs involving the musculoskeletal system: Secondary | ICD-10-CM

## 2015-09-07 DIAGNOSIS — Z7409 Other reduced mobility: Secondary | ICD-10-CM

## 2015-09-07 DIAGNOSIS — M6281 Muscle weakness (generalized): Secondary | ICD-10-CM

## 2015-09-07 DIAGNOSIS — R2681 Unsteadiness on feet: Secondary | ICD-10-CM

## 2015-09-07 DIAGNOSIS — R269 Unspecified abnormalities of gait and mobility: Secondary | ICD-10-CM

## 2015-09-07 DIAGNOSIS — R531 Weakness: Secondary | ICD-10-CM

## 2015-09-07 DIAGNOSIS — R279 Unspecified lack of coordination: Secondary | ICD-10-CM
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
## 2015-09-08 DIAGNOSIS — Z0271 Encounter for disability determination: Secondary | ICD-10-CM

## 2015-09-09 ENCOUNTER — Encounter: Payer: BLUE CROSS/BLUE SHIELD | Admitting: Occupational Therapy

## 2015-09-09 ENCOUNTER — Ambulatory Visit: Payer: BLUE CROSS/BLUE SHIELD | Admitting: Physical Therapy

## 2015-09-09 ENCOUNTER — Ambulatory Visit: Payer: BLUE CROSS/BLUE SHIELD

## 2015-09-10 ENCOUNTER — Ambulatory Visit: Payer: BLUE CROSS/BLUE SHIELD | Admitting: Physical Therapy

## 2015-09-10 ENCOUNTER — Ambulatory Visit: Payer: BLUE CROSS/BLUE SHIELD | Attending: Internal Medicine | Admitting: Occupational Therapy

## 2015-09-10 DIAGNOSIS — R42 Dizziness and giddiness: Secondary | ICD-10-CM | POA: Diagnosis present

## 2015-09-10 DIAGNOSIS — R2681 Unsteadiness on feet: Secondary | ICD-10-CM | POA: Insufficient documentation

## 2015-09-10 DIAGNOSIS — G35 Multiple sclerosis: Secondary | ICD-10-CM | POA: Diagnosis present

## 2015-09-10 DIAGNOSIS — M6281 Muscle weakness (generalized): Secondary | ICD-10-CM | POA: Diagnosis present

## 2015-09-10 DIAGNOSIS — R269 Unspecified abnormalities of gait and mobility: Secondary | ICD-10-CM | POA: Insufficient documentation

## 2015-09-10 DIAGNOSIS — Z7409 Other reduced mobility: Secondary | ICD-10-CM | POA: Diagnosis present

## 2015-09-10 DIAGNOSIS — R279 Unspecified lack of coordination: Secondary | ICD-10-CM | POA: Diagnosis present

## 2015-09-10 DIAGNOSIS — R4701 Aphasia: Secondary | ICD-10-CM | POA: Insufficient documentation
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
## 2015-09-11 ENCOUNTER — Ambulatory Visit: Payer: BLUE CROSS/BLUE SHIELD | Admitting: Physical Therapy

## 2015-09-11 DIAGNOSIS — R2681 Unsteadiness on feet: Secondary | ICD-10-CM

## 2015-09-11 DIAGNOSIS — R279 Unspecified lack of coordination: Secondary | ICD-10-CM

## 2015-09-11 DIAGNOSIS — R42 Dizziness and giddiness: Secondary | ICD-10-CM

## 2015-09-11 DIAGNOSIS — R269 Unspecified abnormalities of gait and mobility: Secondary | ICD-10-CM

## 2015-09-11 NOTE — Patient Instructions (Signed)
Gaze Stabilization: Tip Card  1.Target must remain in focus, not blurry, and appear stationary while head is in motion. 2.Perform exercises with small head movements (45 to either side of midline). 3.Increase speed of head motion so long as target is in focus. 4.If you wear eyeglasses, be sure you can see target through lens (therapist will give specific instructions for bifocal / progressive lenses). 5.These exercises may provoke dizziness or nausea. Work through these symptoms. If too dizzy, slow head movement slightly. Rest between each exercise. 6.Exercises demand concentration; avoid distractions. 7.For safety, perform standing exercises close to a counter, wall, corner, or next to someone.  * If your dizziness increases by 3 or more points from baseline, stop and rest (or walk around cane until it returns to baseline). * When turning head side to side, move within range in which there is only one "A".  Gaze Stabilization: Standing Feet Apart (Compliant Surface)    Stand with back to corner; stable chair in front of you. Feet shoulder width apart, keeping eyes on target __6__ feet away, tilt head down 15-30 and move head side to side for __30__ seconds. Repeat while moving head up and down for _30___ seconds.Also ensure that you do not increase your dizziness more than 3 points (out of 10) Do __2__ sessions per day.  Feet Apart (Compliant Surface) Head Motion - Eyes Closed    Stand on compliant surface: __2__on stacked pillows with feet shoulder width apart. Close eyes and move head slowly diagonally right/up to left/down 10 times and left/up to right/down 10 times. Do _2___ sessions per day.    Feet Together (Compliant Surface) Head Motion - Eyes Open - Feet Together    Stand with your back to a corner with a stable chair in front of you.   With eyes open, standing on compliant surface: __2__stacked pillows, feet together, move head slowly: up and down 10 times; right/left  10 times. Do __2__ sessions per day.     Walking Head Turn    Walk down your long hallway standing close to one wall while turning head side to side. Start out with finger tip support on wall and progress to no support, as safe. Walk down and back in hallway 4 times moving head right to left; then walk down and back 4 times while moving head up to down 4 times down and back.
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
## 2015-09-14 ENCOUNTER — Telehealth: Payer: Self-pay | Admitting: Neurology

## 2015-09-14 MED ORDER — GABAPENTIN 300 MG PO CAPS: 300.0000 mg | ORAL_CAPSULE | Freq: Three times a day (TID) | ORAL | Status: DC

## 2015-09-14 MED ORDER — LORAZEPAM 0.5 MG PO TABS: 0.5000 mg | ORAL_TABLET | Freq: Two times a day (BID) | ORAL | Status: DC

## 2015-09-14 NOTE — Telephone Encounter (Signed)
Patient would like you to take over medication management on Lorazepam and Gabapentin. Medication previously managed by Suanne Marker, MD @ [facility]. Please advise.

## 2015-09-14 NOTE — Telephone Encounter (Signed)
Pt called for prescriptions to be transferred over to here/ Lorazepam 0.5 mg and Gabapintin 300 mg//call back @ [phone]

## 2015-09-14 NOTE — Telephone Encounter (Signed)
PT called and does need a refill through [facility] CB# [phone]

## 2015-09-14 NOTE — Telephone Encounter (Signed)
We can refill those medications

## 2015-09-15 ENCOUNTER — Ambulatory Visit: Payer: BLUE CROSS/BLUE SHIELD

## 2015-09-15 ENCOUNTER — Ambulatory Visit: Payer: BLUE CROSS/BLUE SHIELD | Admitting: Rehabilitation

## 2015-09-16 ENCOUNTER — Ambulatory Visit: Payer: BLUE CROSS/BLUE SHIELD | Admitting: Physical Therapy

## 2015-09-16 ENCOUNTER — Ambulatory Visit: Payer: BLUE CROSS/BLUE SHIELD | Admitting: Occupational Therapy

## 2015-09-16 ENCOUNTER — Encounter: Payer: Self-pay | Admitting: Physical Therapy

## 2015-09-16 ENCOUNTER — Encounter: Payer: Self-pay | Admitting: Occupational Therapy

## 2015-09-16 DIAGNOSIS — R42 Dizziness and giddiness: Secondary | ICD-10-CM

## 2015-09-16 DIAGNOSIS — R279 Unspecified lack of coordination: Secondary | ICD-10-CM | POA: Diagnosis not present

## 2015-09-16 DIAGNOSIS — M6281 Muscle weakness (generalized): Secondary | ICD-10-CM

## 2015-09-16 DIAGNOSIS — R2681 Unsteadiness on feet: Secondary | ICD-10-CM

## 2015-09-16 DIAGNOSIS — R269 Unspecified abnormalities of gait and mobility: Secondary | ICD-10-CM

## 2015-09-16 DIAGNOSIS — Z7409 Other reduced mobility: Secondary | ICD-10-CM
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
## 2015-09-18 ENCOUNTER — Encounter: Payer: Self-pay | Admitting: Rehabilitation

## 2015-09-18 ENCOUNTER — Ambulatory Visit: Payer: BLUE CROSS/BLUE SHIELD | Admitting: *Deleted

## 2015-09-18 ENCOUNTER — Ambulatory Visit: Payer: BLUE CROSS/BLUE SHIELD | Admitting: Rehabilitation

## 2015-09-18 DIAGNOSIS — M6281 Muscle weakness (generalized): Secondary | ICD-10-CM

## 2015-09-18 DIAGNOSIS — R279 Unspecified lack of coordination: Secondary | ICD-10-CM | POA: Diagnosis not present

## 2015-09-18 DIAGNOSIS — R269 Unspecified abnormalities of gait and mobility: Secondary | ICD-10-CM

## 2015-09-18 DIAGNOSIS — G35 Multiple sclerosis: Secondary | ICD-10-CM

## 2015-09-18 DIAGNOSIS — R2681 Unsteadiness on feet: Secondary | ICD-10-CM

## 2015-09-18 DIAGNOSIS — R42 Dizziness and giddiness: Secondary | ICD-10-CM

## 2015-09-18 NOTE — Patient Instructions (Signed)
Using two 3lb dumbbells, one in each hand.   Shoulder flexion/extension (thumb leading the way and not past shoulder height)  Shoulder abduction/adduction (thumb leading the way and not past shoulder height)   Shoulder press (making sure elbows are at a 90* angle and pushing arms up to touch over head)  Elbow bicep flexion/extension  Elbow tricep flexion/extension (behind head, making sure to keep elbows close by head for correct technique)
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
## 2015-09-22 ENCOUNTER — Other Ambulatory Visit: Payer: Self-pay | Admitting: Diagnostic Neuroimaging

## 2015-09-22 ENCOUNTER — Encounter: Payer: Self-pay | Admitting: Rehabilitation

## 2015-09-22 ENCOUNTER — Ambulatory Visit: Payer: BLUE CROSS/BLUE SHIELD

## 2015-09-22 ENCOUNTER — Other Ambulatory Visit: Payer: Self-pay

## 2015-09-22 ENCOUNTER — Ambulatory Visit: Payer: BLUE CROSS/BLUE SHIELD | Admitting: Occupational Therapy

## 2015-09-22 ENCOUNTER — Ambulatory Visit: Payer: BLUE CROSS/BLUE SHIELD | Admitting: Rehabilitation

## 2015-09-22 ENCOUNTER — Encounter: Payer: Self-pay | Admitting: Occupational Therapy

## 2015-09-22 DIAGNOSIS — R4701 Aphasia: Secondary | ICD-10-CM

## 2015-09-22 DIAGNOSIS — R269 Unspecified abnormalities of gait and mobility: Secondary | ICD-10-CM

## 2015-09-22 DIAGNOSIS — R2681 Unsteadiness on feet: Secondary | ICD-10-CM

## 2015-09-22 DIAGNOSIS — R279 Unspecified lack of coordination: Secondary | ICD-10-CM | POA: Diagnosis not present

## 2015-09-22 DIAGNOSIS — Z7409 Other reduced mobility: Secondary | ICD-10-CM

## 2015-09-22 DIAGNOSIS — M6281 Muscle weakness (generalized): Secondary | ICD-10-CM

## 2015-09-22 MED ORDER — INTERFERON BETA-1A 44 MCG/0.5ML ~~LOC~~ SOAJ: 44.0000 ug | SUBCUTANEOUS | Status: DC

## 2015-09-22 NOTE — Patient Instructions (Signed)
Tasks to help word finding:  Games: Nurse, adult Activites: Tourist information centre manager (find online), and Crosswords

## 2015-09-22 NOTE — Patient Instructions (Signed)
SHOULDER EXERCISES: Perform x 10 reps each with 2 lb. Dumb bells in each hand (1-2x/day)  1. Raise both arms straight in front of you to eye level with thumbs leading  2. Raise both arms straight out to side (snow angel) to eye level with thumbs leading  3. Elbow curls (bending and straightening elbows)  4. Triceps - hold weights over head with elbows bent, straighten elbows by pointing weights up towards ceiling
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
## 2015-09-24 ENCOUNTER — Ambulatory Visit: Payer: BLUE CROSS/BLUE SHIELD | Admitting: Occupational Therapy

## 2015-09-24 ENCOUNTER — Other Ambulatory Visit: Payer: Self-pay

## 2015-09-24 ENCOUNTER — Ambulatory Visit: Payer: BLUE CROSS/BLUE SHIELD | Admitting: Physical Therapy

## 2015-09-24 DIAGNOSIS — Z7409 Other reduced mobility: Secondary | ICD-10-CM

## 2015-09-24 DIAGNOSIS — R2681 Unsteadiness on feet: Secondary | ICD-10-CM

## 2015-09-24 DIAGNOSIS — R269 Unspecified abnormalities of gait and mobility: Secondary | ICD-10-CM

## 2015-09-24 DIAGNOSIS — G35 Multiple sclerosis: Secondary | ICD-10-CM

## 2015-09-24 DIAGNOSIS — R279 Unspecified lack of coordination: Secondary | ICD-10-CM | POA: Diagnosis not present

## 2015-09-24 DIAGNOSIS — R42 Dizziness and giddiness: Secondary | ICD-10-CM

## 2015-09-24 MED ORDER — INTERFERON BETA-1A 44 MCG/0.5ML ~~LOC~~ SOSY: PREFILLED_SYRINGE | SUBCUTANEOUS | Status: DC
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
## 2015-09-29 ENCOUNTER — Ambulatory Visit: Payer: BLUE CROSS/BLUE SHIELD | Admitting: Physical Therapy

## 2015-09-29 ENCOUNTER — Ambulatory Visit: Payer: BLUE CROSS/BLUE SHIELD | Admitting: Occupational Therapy

## 2015-09-29 ENCOUNTER — Ambulatory Visit: Payer: BLUE CROSS/BLUE SHIELD

## 2015-10-01 ENCOUNTER — Encounter: Payer: Self-pay | Admitting: Rehabilitation

## 2015-10-01 ENCOUNTER — Encounter: Payer: Self-pay | Admitting: Occupational Therapy

## 2015-10-01 ENCOUNTER — Ambulatory Visit: Payer: BLUE CROSS/BLUE SHIELD | Admitting: Occupational Therapy

## 2015-10-01 ENCOUNTER — Ambulatory Visit: Payer: BLUE CROSS/BLUE SHIELD | Admitting: Rehabilitation

## 2015-10-01 DIAGNOSIS — Z7409 Other reduced mobility: Secondary | ICD-10-CM

## 2015-10-01 DIAGNOSIS — R2681 Unsteadiness on feet: Secondary | ICD-10-CM

## 2015-10-01 DIAGNOSIS — M6281 Muscle weakness (generalized): Secondary | ICD-10-CM

## 2015-10-01 DIAGNOSIS — R279 Unspecified lack of coordination: Secondary | ICD-10-CM | POA: Diagnosis not present

## 2015-10-01 DIAGNOSIS — R269 Unspecified abnormalities of gait and mobility: Secondary | ICD-10-CM

## 2015-10-01 DIAGNOSIS — R42 Dizziness and giddiness: Secondary | ICD-10-CM
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
## 2015-10-06 ENCOUNTER — Telehealth: Payer: Self-pay | Admitting: Neurology

## 2015-10-06 ENCOUNTER — Ambulatory Visit: Payer: BLUE CROSS/BLUE SHIELD

## 2015-10-06 ENCOUNTER — Ambulatory Visit: Payer: BLUE CROSS/BLUE SHIELD | Admitting: Rehabilitation

## 2015-10-06 DIAGNOSIS — R4701 Aphasia: Secondary | ICD-10-CM

## 2015-10-06 DIAGNOSIS — R279 Unspecified lack of coordination: Secondary | ICD-10-CM | POA: Diagnosis not present

## 2015-10-06 NOTE — Telephone Encounter (Signed)
Spoke with patient. Medication was authorized. Pt aware.

## 2015-10-06 NOTE — Telephone Encounter (Signed)
Pt needs to talk to someone about her medication it needs a form to be filled out for auth please call [phone] she states that the paper were faxed to our office

## 2015-10-06 NOTE — Patient Instructions (Signed)
Continue to work word games as you have been, at home.
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
## 2015-10-08 ENCOUNTER — Ambulatory Visit: Payer: BLUE CROSS/BLUE SHIELD | Admitting: Rehabilitation

## 2015-10-21 ENCOUNTER — Telehealth: Payer: Self-pay

## 2015-10-21 ENCOUNTER — Telehealth: Payer: Self-pay | Admitting: Neurology

## 2015-10-21 NOTE — Telephone Encounter (Signed)
[facility] patient.  

## 2015-10-21 NOTE — Telephone Encounter (Signed)
Pt states that she would like to talk to someone about some paper work that she would like Dr Everlena Cooper to fill out please call [phone]

## 2015-10-21 NOTE — Telephone Encounter (Signed)
Patient would like to know if you would fill out some disability form for her. You previously have, those are to expire on 11/09/15. Pt is to retire on 12/09/15. Needs disability form filled out for the month of Feb. She is going to drop them off in the morning. Pt last seen in November. Does she need to come in for this? Please advise.

## 2015-10-21 NOTE — Telephone Encounter (Signed)
We can fill the forms. She doesn't need to be seen for it

## 2015-10-22 NOTE — Telephone Encounter (Signed)
Opened in error

## 2015-11-02 ENCOUNTER — Ambulatory Visit (INDEPENDENT_AMBULATORY_CARE_PROVIDER_SITE_OTHER): Payer: BLUE CROSS/BLUE SHIELD | Admitting: Neurology

## 2015-11-02 ENCOUNTER — Encounter: Payer: Self-pay | Admitting: Neurology

## 2015-11-02 VITALS — BP 112/60 | HR 69 | Ht 65.0 in | Wt 110.7 lb

## 2015-11-02 DIAGNOSIS — G35 Multiple sclerosis: Secondary | ICD-10-CM | POA: Diagnosis not present

## 2015-11-02 NOTE — Patient Instructions (Signed)
Continue Ampyra for walking and Provigil for fatigue Increase D3 to 2000 units daily Follow up with Dr Gaynelle Adu and we will determine if we should stop Rebif Follow up with me in 3 months.

## 2015-11-02 NOTE — Progress Notes (Signed)
NEUROLOGY FOLLOW UP OFFICE NOTE  [name] [id number]  HISTORY OF PRESENT ILLNESS: [name] is a 65 year old right-handed woman with IBS and anxiety who follows up for relapsing-remitting multiple sclerosis.  History obtained by patient and hospital admission.  Images of brain MRI and labs reviewed.  UPDATE: Her gait and strength is overall improving.  She saw Dr. Gaynelle Adu at [facility] for consultation.  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  She has another appointment on 11/18/15.  Recent labs showed WBC 12.7, ALC 2, ALP 51, AST 28, ALT 34 and D3 47.  She started taking Biotin.  HISTORY: In 1977, she developed numbness in the left hand, arm and leg.  She was diagnosed with MS after undergoing CT of head and lumbar puncture.  She was treated with IV ACTH for 10 days.  She has had very mild flare-ups since then, such as facial numbness.  She has required steroids approximately 3 times.  They usually will present with increased gait instability, left sided numbness or leg weakness.  They typically last greater than 1 week.  Her last flare-up was in 2013, in which she needed to receive IV steroids in the hospital.  She had residual weakness for 3 months afterwards.  Her chronic symptoms include fatigue, mild gait instability with occasional falls, left leg numbness, pain, episodic vertigo, or urinary/bowel incontinence.  Occasional spasms in feet or hands.  She also reports occasional bilateral kinetic and postural tremor.  She has history of migraine headaches.  In April 2015, she developed a different left temporal headache.  Sed Rate was 2.  It resolved spontaneously.  Current disease modifying agent:  Rebif (for about 8-10 years) Prior disease modifying agents:  none  Other current medication:  gabapentin  twice daily , baclofen  as needed, Provigil .  She also takes vitamin D 1000-2000 IU daily Prior medications:  Amantadine for  fatigue (GI upset)  MRI of brain with and without contrast from 11/20/14 showed approximately 5-6 small sub-centimeter right hemispheric hyperintensities without abnormal enhancement.  Cervical spinal showed no cord lesions.  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  In September 2016, she had an MS flare-up with left sided numbness and tingling, as well as weakness of the left foot.  She also reported "MS hug".  She was admitted to the hospital from 10/1 to 10/3 for 3 days of IV Solu-Medrol 1gm and underwent home PT.  She also reported temporal headache and double vision.  Sed Rate was 7.  MRI of the brain with and without contrast showed stable less than 10 supratentorial white matter lesions without evidence of acute demyelination.  PAST MEDICAL HISTORY: Past Medical History  Diagnosis Date  . Multiple sclerosis (HCC)   . Anxiety   . IBS (irritable bowel syndrome)     MEDICATIONS: Current Outpatient Prescriptions on File Prior to Visit  Medication Sig Dispense Refill  . baclofen (LIORESAL) 10 MG tablet Take 1 tablet (10 mg total) by mouth daily. at 1 pm (Patient taking differently: Take 10 mg by mouth daily as needed. at 1 pm) 90 tablet 1  . Cholecalciferol (VITAMIN D PO) Take 1 capsule by mouth daily.     . Cyanocobalamin (B-12 PO) Take 1 tablet by mouth daily.    Marland Kitchen dalfampridine (AMPYRA) 10 MG TB12 Take 1 tablet (10 mg total) by mouth every 12 (twelve) hours. 60 tablet 3  . dicyclomine (BENTYL) 20  MG tablet     . ezetimibe (ZETIA) 10 MG tablet Take 10 mg by mouth daily.    Marland Kitchen gabapentin (NEURONTIN) 300 MG capsule Take 1 capsule (300 mg total) by mouth 3 (three) times daily. 270 capsule 1  . Interferon Beta-1a (REBIF REBIDOSE) 44 MCG/0.5ML SOAJ Inject 44 mcg into the skin 3 (three) times a week. 36 Syringe 3  . interferon beta-1a (REBIF) 44 MCG/0.5ML SOSY injection INJECT 0.5 ML (44 MCG TOTAL) UNDER THE SKIN THREE TIMES A WEEK 36 Syringe 1  . LORazepam (ATIVAN) 0.5 MG tablet  Take 1 tablet (0.5 mg total) by mouth 2 (two) times daily. 180 tablet 0  . meloxicam (MOBIC) 15 MG tablet Take 7.5 mg by mouth 2 (two) times daily.    . modafinil (PROVIGIL) 200 MG tablet Take 1 tablet (200 mg total) by mouth daily. 30 tablet 3  . Omega-3 Fatty Acids (FISH OIL) 1200 MG CAPS Take 1 capsule by mouth 3 (three) times daily.     Marland Kitchen PARoxetine (PAXIL-CR) 25 MG 24 hr tablet Take 25 mg by mouth every morning.  6  . Probiotic Product (PROBIOTIC DAILY) CAPS Take 1 capsule by mouth daily.    . rifaximin (XIFAXAN) 550 MG TABS tablet Take 2 tablets by mouth daily.     . vitamin C (ASCORBIC ACID) 500 MG tablet Take by mouth.    Marland Kitchen VITAMIN E PO Take 1 tablet by mouth daily.     No current facility-administered medications on file prior to visit.    ALLERGIES: Allergies  Allergen Reactions  . Celebrex [Celecoxib] Itching and Swelling    Throat swelling  . Red Yeast Rice [Cholestin] Itching and Swelling    Throat swelling  . Zocor [Simvastatin] Itching and Swelling    Throat swelling  . Achromycin [Tetracycline] Hives  . Codeine Sulfate Nausea And Vomiting  . Welchol [Colesevelam Hcl] Nausea And Vomiting    FAMILY HISTORY: Family History  Problem Relation Age of Onset  . Alzheimer's disease Mother   . Endometriosis Daughter   . Endometriosis Daughter     SOCIAL HISTORY: Social History   Social History  . Marital Status: Married    Spouse Name: Jonny Ruiz  . Number of Children: 3  . Years of Education: BA   Occupational History  . Teacher   .     Social History Main Topics  . Smoking status: Never Smoker   . Smokeless tobacco: Never Used  . Alcohol Use: Yes     Comment: occasionally 1 glass of wine  . Drug Use: No  . Sexual Activity: Not on file   Other Topics Concern  . Not on file   Social History Narrative   Pt lives at home with her spouse.   Caffeine Use: occasionally     REVIEW OF SYSTEMS: Constitutional: No fevers, chills, or sweats, no generalized  fatigue, change in appetite Eyes: No visual changes, double vision, eye pain Ear, nose and throat: No hearing loss, ear pain, nasal congestion, sore throat Cardiovascular: No chest pain, palpitations Respiratory:  No shortness of breath at rest or with exertion, wheezes GastrointestinaI: No nausea, vomiting, diarrhea, abdominal pain, fecal incontinence Genitourinary:  No dysuria, urinary retention or frequency Musculoskeletal:  No neck pain, back pain Integumentary: No rash, pruritus, skin lesions Neurological: as above Psychiatric: No depression, insomnia, anxiety Endocrine: No palpitations, fatigue, diaphoresis, mood swings, change in appetite, change in weight, increased thirst Hematologic/Lymphatic:  No anemia, purpura, petechiae. Allergic/Immunologic: no itchy/runny eyes, nasal congestion, recent  allergic reactions, rashes  PHYSICAL EXAM: Filed Vitals:   11/02/15 1503  BP: 112/60  Pulse: 69   General: No acute distress.  Patient appears we-groomed.  normal body habitus. Head:  Normocephalic/atraumatic Eyes:  Fundoscopic exam unremarkable without vessel changes, exudates, hemorrhages or papilledema. Neck: supple, no paraspinal tenderness, full range of motion Heart:  Regular rate and rhythm Lungs:  Clear to auscultation bilaterally Back: No paraspinal tenderness Neurological Exam: alert and oriented to person, place, and time. Attention span and concentration intact, recent and remote memory intact, fund of knowledge intact.  Speech fluent and not dysarthric, language intact.  CN II-XII intact. Fundoscopic exam unremarkable without vessel changes, exudates, hemorrhages or papilledema.  Bulk and tone normal, 5/5 left hip flexion and knee extension. Otherwise, muscle strength 5/5 throughout.  Reduced pinprick sensation in the left leg up to just above the knee.  She has hyperesthesia of the left foot and hand.  Vibration sensation intact.  Deep tendon reflexes 3+ in lower extremities,  2+ upper extremities, toes downgoing.  Finger to nose with intention tremor but no dysmetria.  Heel to shin testing intact.  Slight left limp.  Timed 25 foot walk 7.13 seconds.  Romberg negative.  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  PLAN: 1.  Continue Rebif for now, until re-evaluation at [facility] 2.  Ampyra, Provigil, gabapentin, baclofen, Biotin 3.  Increase D3 from 1000 IU to 2000 IU daily 4.  Follow up in 3 months.  22 minutes spent face to face with patient, over 50% spent discussing management and diagnosis.  Shon Millet, DO  CC:  Lupita Raider, MD

## 2015-11-03 ENCOUNTER — Telehealth: Payer: Self-pay | Admitting: Neurology

## 2015-11-03 NOTE — Telephone Encounter (Signed)
Pt is calling back to let us know that she is taking biotin 100mg   3xdaily pt phone number is [phone]

## 2015-11-03 NOTE — Progress Notes (Signed)
Chart forwarded.  

## 2015-11-03 NOTE — Telephone Encounter (Signed)
Chart updated to reflect biotin 100 mg TID.

## 2015-11-20 ENCOUNTER — Telehealth: Payer: Self-pay

## 2015-11-20 DIAGNOSIS — G35 Multiple sclerosis: Secondary | ICD-10-CM

## 2015-11-20 NOTE — Telephone Encounter (Signed)
Received disability form to complete. Provider would like pt to have Functional capacity evaluation. Spoke to Outpatient rehab done at [facility] street location. Referral sent. Form faxed to them as well. Pt aware. Has a phone appointment w/ disability service today about form, will ask if she needs exam or if she can guesstimate.

## 2015-12-01 ENCOUNTER — Telehealth: Payer: Self-pay | Admitting: Neurology

## 2015-12-01 NOTE — Telephone Encounter (Signed)
Left message on machine for pt to return call to the office.  

## 2015-12-01 NOTE — Telephone Encounter (Signed)
Pt needs to talk to someone about a function test. She states that her insurance approved the test but wants to talk to you about it please call [phone]

## 2015-12-02 NOTE — Telephone Encounter (Signed)
Patient called back. Insurance approved her disability w/o form being filled out. Patient is going to cancel function testing.

## 2015-12-03 ENCOUNTER — Ambulatory Visit: Payer: BLUE CROSS/BLUE SHIELD

## 2015-12-11 ENCOUNTER — Telehealth: Payer: Self-pay | Admitting: Neurology

## 2015-12-11 NOTE — Telephone Encounter (Signed)
Called patient back and left message for her to call me back.  

## 2015-12-11 NOTE — Telephone Encounter (Signed)
PT called in regards to her medication and wanted to know if she has to keep taking it/Dawn CB# [phone]

## 2015-12-30 ENCOUNTER — Other Ambulatory Visit: Payer: Self-pay

## 2015-12-30 MED ORDER — LORAZEPAM 0.5 MG PO TABS: 0.5000 mg | ORAL_TABLET | Freq: Two times a day (BID) | ORAL | Status: DC

## 2016-02-12 ENCOUNTER — Telehealth: Payer: Self-pay | Admitting: Neurology

## 2016-02-12 ENCOUNTER — Other Ambulatory Visit: Payer: Self-pay | Admitting: *Deleted

## 2016-02-12 MED ORDER — BACLOFEN 10 MG PO TABS: 10.0000 mg | ORAL_TABLET | Freq: Every day | ORAL | Status: DC

## 2016-02-12 NOTE — Telephone Encounter (Signed)
Rx sent 

## 2016-02-12 NOTE — Telephone Encounter (Signed)
VM-PT left message and asked if Dr Everlena Cooper can call in a [facility] and while [facility] the [facility] can you [facility] in a 10 day supply at [facility] in [place]/Dawn CB# [phone]

## 2016-02-16 ENCOUNTER — Ambulatory Visit (INDEPENDENT_AMBULATORY_CARE_PROVIDER_SITE_OTHER): Payer: BLUE CROSS/BLUE SHIELD | Admitting: Neurology

## 2016-02-16 ENCOUNTER — Telehealth: Payer: Self-pay | Admitting: Neurology

## 2016-02-16 ENCOUNTER — Other Ambulatory Visit (HOSPITAL_COMMUNITY): Payer: Self-pay | Admitting: Family Medicine

## 2016-02-16 ENCOUNTER — Telehealth: Payer: Self-pay

## 2016-02-16 ENCOUNTER — Encounter: Payer: Self-pay | Admitting: Neurology

## 2016-02-16 VITALS — BP 114/68 | HR 81 | Ht 65.0 in | Wt 113.0 lb

## 2016-02-16 DIAGNOSIS — R413 Other amnesia: Secondary | ICD-10-CM | POA: Diagnosis not present

## 2016-02-16 DIAGNOSIS — G35 Multiple sclerosis: Secondary | ICD-10-CM | POA: Diagnosis not present

## 2016-02-16 DIAGNOSIS — R002 Palpitations: Secondary | ICD-10-CM

## 2016-02-16 NOTE — Telephone Encounter (Signed)
I don't know.  I would defer to her PCP

## 2016-02-16 NOTE — Patient Instructions (Signed)
We will repeat memory testing in 6 months. Continue baclofen and Ampyra

## 2016-02-16 NOTE — Telephone Encounter (Signed)
VM-PT left a message in regards to wanting  Dr Everlena Cooper  To let her know what he recommends for attention problem/Dawn CB# [phone]

## 2016-02-16 NOTE — Telephone Encounter (Signed)
Medical records mailed to [employer] [address] [place] [address]. Medical release placed in scan box

## 2016-02-16 NOTE — Telephone Encounter (Signed)
Please see message below. Pt would like your suggestions for her attention issues.

## 2016-02-16 NOTE — Progress Notes (Signed)
NEUROLOGY FOLLOW UP OFFICE NOTE  [name] [id number]  HISTORY OF PRESENT ILLNESS: [name] is a 65 year old right-handed woman with IBS and anxiety who follows up for relapsing-remitting multiple sclerosis.  History obtained by patient and hospital admission.  Images of brain MRI and labs reviewed.  UPDATE: She has since retired.  She is following up with the MS Clinic at [facility].  Rebif was discontinued.  She is taking Biotin.  She reports some memory issues.  She feels briefly confused at times.  She will walk into the kitchen to get coffee, like she does every morning, and will briefly forget why she was there.  Her husband reminds her about paying the bills.  She hasn't had any disorientation while driving on familiar routes.  She feels preoccupied.  She stopped gabapentin a couple of weeks ago to see if that was causing it.  She has not noticed any change.  She also stopped Provigil due to increased headache.  HISTORY: In 1977, she developed numbness in the left hand, arm and leg.  She was diagnosed with MS after undergoing CT of head and lumbar puncture.  She was treated with IV ACTH for 10 days.  She has had very mild flare-ups since then, such as facial numbness.  She has required steroids approximately 3 times.  They usually will present with increased gait instability, left sided numbness or leg weakness.  They typically last greater than 1 week.  Her last flare-up was in 2013, in which she needed to receive IV steroids in the hospital.  She had residual weakness for 3 months afterwards.  Her chronic symptoms include fatigue, mild gait instability with occasional falls, left leg numbness, pain, episodic vertigo, or urinary/bowel incontinence.  Occasional spasms in feet or hands.  She also reports occasional bilateral kinetic and postural tremor.  She has history of migraine headaches.  In April 2015, she developed a different left temporal headache.  Sed Rate was 2.  It  resolved spontaneously.  Current disease modifying agent:  Rebif (for about 8-10 years) Prior disease modifying agents:  none  Other current medication:  gabapentin 600mg  twice daily , baclofen 10mg  as needed, Provigil 200mg .  She also takes vitamin D 1000-2000 IU daily Prior medications:  Amantadine for fatigue (GI upset)  MRI of brain with and without contrast from 11/20/14 showed approximately 5-6 small sub-centimeter right hemispheric hyperintensities without abnormal enhancement.  Cervical spinal showed no cord lesions.  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  In September 2016, she had an MS flare-up with left sided numbness and tingling, as well as weakness of the left foot.  She also reported "MS hug".  She was admitted to the hospital from 10/1 to 10/3 for 3 days of IV Solu-Medrol 1gm and underwent home PT.  She also reported temporal headache and double vision.  Sed Rate was 7.  MRI of the brain with and without contrast showed stable less than 10 supratentorial white matter lesions without evidence of acute demyelination.    She saw Dr. Gaynelle Adu at [facility] for consultation.  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  PAST MEDICAL HISTORY: Past Medical History  Diagnosis Date  . Multiple sclerosis (HCC)   . Anxiety   . IBS (irritable bowel syndrome)     MEDICATIONS: Current Outpatient Prescriptions on File Prior to Visit  Medication Sig Dispense Refill  . baclofen (LIORESAL) 10 MG tablet Take 1 tablet (10 mg total) by mouth daily.  at 1 pm 10 tablet 0  . BIOTIN PO Take 100 mg by mouth 3 (three) times daily.    . Cholecalciferol (VITAMIN D PO) Take 1 capsule by mouth daily.     . Cyanocobalamin (B-12 PO) Take 1 tablet by mouth daily.    Marland Kitchen dalfampridine (AMPYRA) 10 MG TB12 Take 1 tablet (10 mg total) by mouth every 12 (twelve) hours. 60 tablet 3  . dicyclomine (BENTYL) 20 MG tablet     . ezetimibe (ZETIA) 10 MG tablet Take 10 mg by mouth daily.    Marland Kitchen LORazepam  (ATIVAN) 0.5 MG tablet Take 1 tablet (0.5 mg total) by mouth 2 (two) times daily. 180 tablet 0  . meloxicam (MOBIC) 15 MG tablet Take 7.5 mg by mouth 2 (two) times daily.    . Multiple Vitamins-Minerals (BIOTECT PLUS) CAPS Take by mouth.    . Omega-3 Fatty Acids (FISH OIL) 1200 MG CAPS Take 1 capsule by mouth 3 (three) times daily.     Marland Kitchen PARoxetine (PAXIL-CR) 25 MG 24 hr tablet Take 25 mg by mouth every morning.  6  . Probiotic Product (PROBIOTIC DAILY) CAPS Take 1 capsule by mouth daily.    . rifaximin (XIFAXAN) 550 MG TABS tablet Take 2 tablets by mouth daily.     . vitamin C (ASCORBIC ACID) 500 MG tablet Take by mouth.    Marland Kitchen VITAMIN E PO Take 1 tablet by mouth daily.    . Interferon Beta-1a (REBIF REBIDOSE) 44 MCG/0.5ML SOAJ Inject 44 mcg into the skin 3 (three) times a week. (Patient not taking: Reported on 02/16/2016) 36 Syringe 3  . interferon beta-1a (REBIF) 44 MCG/0.5ML SOSY injection INJECT 0.5 ML (44 MCG TOTAL) UNDER THE SKIN THREE TIMES A WEEK (Patient not taking: Reported on 02/16/2016) 36 Syringe 1  . modafinil (PROVIGIL) 200 MG tablet Take 1 tablet (200 mg total) by mouth daily. (Patient not taking: Reported on 02/16/2016) 30 tablet 3   No current facility-administered medications on file prior to visit.    ALLERGIES: Allergies  Allergen Reactions  . Celebrex [Celecoxib] Itching and Swelling    Throat swelling  . Red Yeast Rice [Cholestin] Itching and Swelling    Throat swelling  . Zocor [Simvastatin] Itching and Swelling    Throat swelling  . Achromycin [Tetracycline] Hives  . Codeine Sulfate Nausea And Vomiting  . Welchol [Colesevelam Hcl] Nausea And Vomiting    FAMILY HISTORY: Family History  Problem Relation Age of Onset  . Alzheimer's disease Mother   . Endometriosis Daughter   . Endometriosis Daughter     SOCIAL HISTORY: Social History   Social History  . Marital Status: Married    Spouse Name: Jonny Ruiz  . Number of Children: 3  . Years of Education: BA    Occupational History  . Teacher   .     Social History Main Topics  . Smoking status: Never Smoker   . Smokeless tobacco: Never Used  . Alcohol Use: Yes     Comment: occasionally 1 glass of wine  . Drug Use: No  . Sexual Activity: Not on file   Other Topics Concern  . Not on file   Social History Narrative   Pt lives at home with her spouse.   Caffeine Use: occasionally     REVIEW OF SYSTEMS: Constitutional: No fevers, chills, or sweats, no generalized fatigue, change in appetite Eyes: No visual changes, double vision, eye pain Ear, nose and throat: No hearing loss, ear pain, nasal congestion, sore  throat Cardiovascular: No chest pain, palpitations Respiratory:  No shortness of breath at rest or with exertion, wheezes GastrointestinaI: No nausea, vomiting, diarrhea, abdominal pain, fecal incontinence Genitourinary:  No dysuria, urinary retention or frequency Musculoskeletal:  No neck pain, back pain Integumentary: No rash, pruritus, skin lesions Neurological: as above Psychiatric: No depression, insomnia, anxiety Endocrine: No palpitations, fatigue, diaphoresis, mood swings, change in appetite, change in weight, increased thirst Hematologic/Lymphatic:  No anemia, purpura, petechiae. Allergic/Immunologic: no itchy/runny eyes, nasal congestion, recent allergic reactions, rashes  PHYSICAL EXAM: Filed Vitals:   02/16/16 0958  BP: 114/68  Pulse: 81   General: No acute distress.  Patient appears well-groomed.  normal body habitus. Head:  Normocephalic/atraumatic Eyes:  Fundi examined but not visualized Neck: supple, no paraspinal tenderness, full range of motion Heart:  Regular rate and rhythm Lungs:  Clear to auscultation bilaterally Back: No paraspinal tenderness Neurological Exam: alert and oriented to person, place, and time. Attention span and concentration intact, recent and remote memory intact, fund of knowledge intact.  Speech fluent and not dysarthric,  language intact.   Montreal Cognitive Assessment  02/16/2016  Visuospatial/ Executive (0/5) 4  Naming (0/3) 3  Attention: Read list of digits (0/2) 2  Attention: Read list of letters (0/1) 0  Attention: Serial 7 subtraction starting at 100 (0/3) 2  Language: Repeat phrase (0/2) 2  Language : Fluency (0/1) 1  Abstraction (0/2) 2  Delayed Recall (0/5) 2  Orientation (0/6) 6  Total 24  Adjusted Score (based on education) 24   CN II-XII intact. Bulk and tone normal, muscle strength 5/5 throughout.  Sensation to light touch, temperature and vibration intact.  Deep tendon reflexes 2+ throughout, toes downgoing.  Finger to nose and heel to shin testing intact.  Slight left limp.  Timed 25 foot walk 7.94 seconds.  Romberg negative.  IMPRESSION: Secondary progressive MS Memory deficits.  Cognitive impairment not appreciated at this time.  Problems with attention and concentration may be a factor, possibly due to adjustment since retiring.  PLAN: 1.  She is being followed by the MS Clinic at [facility].  Currently, she is not on a disease modifying agent as it is not likely effective for secondary progressive MS. 2.  Ampyra 3.  Baclofen 4.  Biotin 5.  Monitor memory and recheck MOCA in 6 months.  27 minutes spent face to face with patient, over 50% spent discussing management and diagnosis.  Shon Millet, DO  CC:  Lupita Raider

## 2016-02-16 NOTE — Progress Notes (Signed)
Chart forwarded.  

## 2016-02-17 DIAGNOSIS — R002 Palpitations: Secondary | ICD-10-CM | POA: Insufficient documentation

## 2016-02-18 ENCOUNTER — Ambulatory Visit (INDEPENDENT_AMBULATORY_CARE_PROVIDER_SITE_OTHER): Payer: BLUE CROSS/BLUE SHIELD

## 2016-02-18 DIAGNOSIS — R002 Palpitations: Secondary | ICD-10-CM

## 2016-02-25 ENCOUNTER — Other Ambulatory Visit: Payer: Self-pay

## 2016-02-25 MED ORDER — LORAZEPAM 0.5 MG PO TABS: 0.5000 mg | ORAL_TABLET | Freq: Two times a day (BID) | ORAL | Status: DC

## 2016-03-01 ENCOUNTER — Other Ambulatory Visit: Payer: Self-pay

## 2016-03-01 ENCOUNTER — Ambulatory Visit (HOSPITAL_COMMUNITY): Payer: BLUE CROSS/BLUE SHIELD | Attending: Cardiology

## 2016-03-01 DIAGNOSIS — E785 Hyperlipidemia, unspecified: Secondary | ICD-10-CM | POA: Insufficient documentation

## 2016-03-01 DIAGNOSIS — R002 Palpitations: Secondary | ICD-10-CM | POA: Insufficient documentation

## 2016-03-28 ENCOUNTER — Other Ambulatory Visit: Payer: Self-pay | Admitting: Neurosurgery

## 2016-03-28 DIAGNOSIS — M47896 Other spondylosis, lumbar region: Secondary | ICD-10-CM

## 2016-04-10 ENCOUNTER — Ambulatory Visit
Admission: RE | Admit: 2016-04-10 | Discharge: 2016-04-10 | Disposition: A | Discharge status: Home / Self Care | Payer: BLUE CROSS/BLUE SHIELD | Source: Ambulatory Visit | Attending: Neurosurgery | Admitting: Neurosurgery

## 2016-04-10 DIAGNOSIS — M47896 Other spondylosis, lumbar region: Secondary | ICD-10-CM

## 2016-04-13 ENCOUNTER — Telehealth: Payer: Self-pay | Admitting: Internal Medicine

## 2016-04-13 ENCOUNTER — Encounter: Payer: Self-pay | Admitting: Internal Medicine

## 2016-04-13 ENCOUNTER — Ambulatory Visit (INDEPENDENT_AMBULATORY_CARE_PROVIDER_SITE_OTHER): Payer: BLUE CROSS/BLUE SHIELD | Admitting: Internal Medicine

## 2016-04-13 ENCOUNTER — Ambulatory Visit: Payer: BLUE CROSS/BLUE SHIELD | Admitting: Cardiology

## 2016-04-13 VITALS — BP 124/84 | HR 67 | Ht 65.0 in | Wt 115.0 lb

## 2016-04-13 DIAGNOSIS — R002 Palpitations: Secondary | ICD-10-CM | POA: Insufficient documentation

## 2016-04-13 DIAGNOSIS — R079 Chest pain, unspecified: Secondary | ICD-10-CM | POA: Diagnosis not present

## 2016-04-13 DIAGNOSIS — R9431 Abnormal electrocardiogram [ECG] [EKG]: Secondary | ICD-10-CM

## 2016-04-13 DIAGNOSIS — Z8249 Family history of ischemic heart disease and other diseases of the circulatory system: Secondary | ICD-10-CM | POA: Diagnosis not present

## 2016-04-13 DIAGNOSIS — G35 Multiple sclerosis: Secondary | ICD-10-CM

## 2016-04-13 MED ORDER — PROPRANOLOL HCL ER 60 MG PO CP24: 60.0000 mg | ORAL_CAPSULE | Freq: Every day | ORAL | Status: DC

## 2016-04-13 NOTE — Patient Instructions (Addendum)
Your physician has requested that you have a lexiscan myoview. For further information please visit [email or web address] Please follow instruction sheet, as given.  Your physician has recommended you make the following change in your medication: START propranolol  once daily for palpitations  Your physician recommends that you schedule a follow-up appointment with Dr. Rennis Golden after your stress test.

## 2016-04-13 NOTE — Telephone Encounter (Signed)
Received records from [facility] for appointment on 04/13/16 with Dr Hilty.  Records given to N Hines (medical records) for Dr Hilty's schedule on 04/13/16. lp °
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
## 2016-04-14 ENCOUNTER — Ambulatory Visit (HOSPITAL_COMMUNITY)
Admission: RE | Admit: 2016-04-14 | Discharge: 2016-04-14 | Disposition: A | Discharge status: Home / Self Care | Payer: BLUE CROSS/BLUE SHIELD | Source: Ambulatory Visit | Attending: Cardiovascular Disease | Admitting: Cardiovascular Disease

## 2016-04-14 ENCOUNTER — Encounter: Payer: Self-pay | Admitting: Internal Medicine

## 2016-04-14 DIAGNOSIS — I491 Atrial premature depolarization: Secondary | ICD-10-CM | POA: Insufficient documentation

## 2016-04-14 DIAGNOSIS — R42 Dizziness and giddiness: Secondary | ICD-10-CM | POA: Insufficient documentation

## 2016-04-14 DIAGNOSIS — R9431 Abnormal electrocardiogram [ECG] [EKG]: Secondary | ICD-10-CM | POA: Diagnosis present

## 2016-04-14 DIAGNOSIS — R079 Chest pain, unspecified: Secondary | ICD-10-CM | POA: Insufficient documentation

## 2016-04-14 DIAGNOSIS — R0602 Shortness of breath: Secondary | ICD-10-CM | POA: Diagnosis not present

## 2016-04-14 DIAGNOSIS — I493 Ventricular premature depolarization: Secondary | ICD-10-CM | POA: Insufficient documentation

## 2016-04-14 DIAGNOSIS — Z8249 Family history of ischemic heart disease and other diseases of the circulatory system: Secondary | ICD-10-CM | POA: Diagnosis not present

## 2016-04-14 DIAGNOSIS — R002 Palpitations: Secondary | ICD-10-CM | POA: Diagnosis not present

## 2016-04-14 LAB — MYOCARDIAL PERFUSION IMAGING
CHL CUP NUCLEAR SRS: 0
CHL CUP NUCLEAR SSS: 0
LV sys vol: 32 mL
LVDIAVOL: 73 mL (ref 46–106)
NUC STRESS TID: 1.19
Peak HR: 82 {beats}/min
Rest HR: 52 {beats}/min
SDS: 0

## 2016-04-14 MED ORDER — REGADENOSON 0.4 MG/5ML IV SOLN
0.4000 mg | Freq: Once | INTRAVENOUS | Status: AC
  Administered 2016-04-14: 0.4 mg via INTRAVENOUS

## 2016-04-14 MED ORDER — TECHNETIUM TC 99M TETROFOSMIN IV KIT
10.5000 | PACK | Freq: Once | INTRAVENOUS | Status: AC | PRN
  Administered 2016-04-14: 10.5 via INTRAVENOUS
  Filled 2016-04-14: qty 11

## 2016-04-14 MED ORDER — TECHNETIUM TC 99M TETROFOSMIN IV KIT
31.0000 | PACK | Freq: Once | INTRAVENOUS | Status: AC | PRN
  Administered 2016-04-14: 31 via INTRAVENOUS
  Filled 2016-04-14: qty 31

## 2016-04-14 MED ORDER — AMINOPHYLLINE 25 MG/ML IV SOLN
125.0000 mg | Freq: Once | INTRAVENOUS | Status: AC
  Administered 2016-04-14: 125 mg via INTRAVENOUS

## 2016-04-15 ENCOUNTER — Telehealth: Payer: Self-pay | Admitting: Internal Medicine

## 2016-04-15 ENCOUNTER — Encounter: Payer: Self-pay | Admitting: *Deleted

## 2016-04-15 NOTE — Telephone Encounter (Signed)
Returned call to patient.She stated she saw results of myoview on mychart,but she wanted to ask Dr.Hilty if he thought she was having SVT.Stated she just started taking propranolol 60 mg daily.Stated she cannot tell if it helping her palpitations.Advised Dr.Hilty out of office. I will send message to him.

## 2016-04-15 NOTE — Telephone Encounter (Signed)
Pt calling regarding results of stress test-saw results on Mychart-but wants to know if Dr. Rennis Golden thinks she still has SVT?  And is she to continue with the med he gave her? pls advise [phone]

## 2016-04-18 ENCOUNTER — Emergency Department (HOSPITAL_COMMUNITY): Payer: BLUE CROSS/BLUE SHIELD

## 2016-04-18 ENCOUNTER — Emergency Department (HOSPITAL_COMMUNITY)
Admission: EM | Admit: 2016-04-18 | Discharge: 2016-04-18 | Disposition: A | Discharge status: Home / Self Care | Payer: BLUE CROSS/BLUE SHIELD | Attending: Emergency Medicine | Admitting: Emergency Medicine

## 2016-04-18 ENCOUNTER — Encounter (HOSPITAL_COMMUNITY): Payer: Self-pay

## 2016-04-18 DIAGNOSIS — S0990XA Unspecified injury of head, initial encounter: Secondary | ICD-10-CM | POA: Diagnosis not present

## 2016-04-18 DIAGNOSIS — M25552 Pain in left hip: Secondary | ICD-10-CM

## 2016-04-18 DIAGNOSIS — Z79899 Other long term (current) drug therapy: Secondary | ICD-10-CM | POA: Insufficient documentation

## 2016-04-18 DIAGNOSIS — Y9389 Activity, other specified: Secondary | ICD-10-CM | POA: Diagnosis not present

## 2016-04-18 DIAGNOSIS — W19XXXA Unspecified fall, initial encounter: Secondary | ICD-10-CM

## 2016-04-18 DIAGNOSIS — W0110XA Fall on same level from slipping, tripping and stumbling with subsequent striking against unspecified object, initial encounter: Secondary | ICD-10-CM | POA: Insufficient documentation

## 2016-04-18 DIAGNOSIS — Y92009 Unspecified place in unspecified non-institutional (private) residence as the place of occurrence of the external cause: Secondary | ICD-10-CM | POA: Insufficient documentation

## 2016-04-18 DIAGNOSIS — Y999 Unspecified external cause status: Secondary | ICD-10-CM | POA: Diagnosis not present

## 2016-04-18 MED ORDER — HYDROCODONE-ACETAMINOPHEN 5-325 MG PO TABS: 2.0000 | ORAL_TABLET | ORAL | Status: DC | PRN

## 2016-04-18 MED ORDER — FENTANYL CITRATE (PF) 100 MCG/2ML IJ SOLN
50.0000 ug | Freq: Once | INTRAMUSCULAR | Status: AC
  Administered 2016-04-18: 50 ug via INTRAMUSCULAR
  Filled 2016-04-18: qty 2

## 2016-04-18 NOTE — Telephone Encounter (Signed)
LM for patient with results & MD advice. OK per DPR.
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
## 2016-04-18 NOTE — Discharge Instructions (Signed)
Concussion, Adult A concussion, or closed-head injury, is a brain injury caused by a direct blow to the head or by a quick and sudden movement (jolt) of the head or neck. Concussions are usually not life-threatening. Even so, the effects of a concussion can be serious. If you have had a concussion before, you are more likely to experience concussion-like symptoms after a direct blow to the head.  CAUSES  Direct blow to the head, such as from running into another player during a soccer game, being hit in a fight, or hitting your head on a hard surface.  A jolt of the head or neck that causes the brain to move back and forth inside the skull, such as in a car crash. SIGNS AND SYMPTOMS The signs of a concussion can be hard to notice. Early on, they may be missed by you, family members, and health care providers. You may look fine but act or feel differently. Symptoms are usually temporary, but they may last for days, weeks, or even longer. Some symptoms may appear right away while others may not show up for hours or days. Every head injury is different. Symptoms include:  Mild to moderate headaches that will not go away.  A feeling of pressure inside your head.  Having more trouble than usual:  Learning or remembering things you have heard.  Answering questions.  Paying attention or concentrating.  Organizing daily tasks.  Making decisions and solving problems.  Slowness in thinking, acting or reacting, speaking, or reading.  Getting lost or being easily confused.  Feeling tired all the time or lacking energy (fatigued).  Feeling drowsy.  Sleep disturbances.  Sleeping more than usual.  Sleeping less than usual.  Trouble falling asleep.  Trouble sleeping (insomnia).  Loss of balance or feeling lightheaded or dizzy.  Nausea or vomiting.  Numbness or tingling.  Increased sensitivity to:  Sounds.  Lights.  Distractions.  Vision problems or eyes that tire  easily.  Diminished sense of taste or smell.  Ringing in the ears.  Mood changes such as feeling sad or anxious.  Becoming easily irritated or angry for little or no reason.  Lack of motivation.  Seeing or hearing things other people do not see or hear (hallucinations). DIAGNOSIS Your health care provider can usually diagnose a concussion based on a description of your injury and symptoms. He or she will ask whether you passed out (lost consciousness) and whether you are having trouble remembering events that happened right before and during your injury. Your evaluation might include:  A brain scan to look for signs of injury to the brain. Even if the test shows no injury, you may still have a concussion.  Blood tests to be sure other problems are not present. TREATMENT  Concussions are usually treated in an emergency department, in urgent care, or at a clinic. You may need to stay in the hospital overnight for further treatment.  Tell your health care provider if you are taking any medicines, including prescription medicines, over-the-counter medicines, and natural remedies. Some medicines, such as blood thinners (anticoagulants) and aspirin, may increase the chance of complications. Also tell your health care provider whether you have had alcohol or are taking illegal drugs. This information may affect treatment.  Your health care provider will send you home with important instructions to follow.  How fast you will recover from a concussion depends on many factors. These factors include how severe your concussion is, what part of your brain was injured,  your age, and how healthy you were before the concussion. °· Most people with mild injuries recover fully. Recovery can take time. In general, recovery is slower in older persons. Also, persons who have had a concussion in the past or have other medical problems may find that it takes longer to recover from their current injury. °HOME  CARE INSTRUCTIONS °General Instructions °· Carefully follow the directions your health care provider gave you. °· Only take over-the-counter or prescription medicines for pain, discomfort, or fever as directed by your health care provider. °· Take only those medicines that your health care provider has approved. °· Do not drink alcohol until your health care provider says you are well enough to do so. Alcohol and certain other drugs may slow your recovery and can put you at risk of further injury. °· If it is harder than usual to remember things, write them down. °· If you are easily distracted, try to do one thing at a time. For example, do not try to watch TV while fixing dinner. °· Talk with family members or close friends when making important decisions. °· Keep all follow-up appointments. Repeated evaluation of your symptoms is recommended for your recovery. °· Watch your symptoms and tell others to do the same. Complications sometimes occur after a concussion. Older adults with a brain injury may have a higher risk of serious complications, such as a blood clot on the brain. °· Tell your teachers, school nurse, school counselor, coach, athletic trainer, or work manager about your injury, symptoms, and restrictions. Tell them about what you can or cannot do. They should watch for: °¨ Increased problems with attention or concentration. °¨ Increased difficulty remembering or learning new information. °¨ Increased time needed to complete tasks or assignments. °¨ Increased irritability or decreased ability to cope with stress. °¨ Increased symptoms. °· Rest. Rest helps the brain to heal. Make sure you: °¨ Get plenty of sleep at night. Avoid staying up late at night. °¨ Keep the same bedtime hours on weekends and weekdays. °¨ Rest during the day. Take daytime naps or rest breaks when you feel tired. °· Limit activities that require a lot of thought or concentration. These include: °¨ Doing homework or job-related  work. °¨ Watching TV. °¨ Working on the computer. °· Avoid any situation where there is potential for another head injury (football, hockey, soccer, basketball, martial arts, downhill snow sports and horseback riding). Your condition will get worse every time you experience a concussion. You should avoid these activities until you are evaluated by the appropriate follow-up health care providers. °Returning To Your Regular Activities °You will need to return to your normal activities slowly, not all at once. You must give your body and brain enough time for recovery. °· Do not return to sports or other athletic activities until your health care provider tells you it is safe to do so. °· Ask your health care provider when you can drive, ride a bicycle, or operate heavy machinery. Your ability to react may be slower after a brain injury. Never do these activities if you are dizzy. °· Ask your health care provider about when you can return to work or school. °Preventing Another Concussion °It is very important to avoid another brain injury, especially before you have recovered. In rare cases, another injury can lead to permanent brain damage, brain swelling, or death. The risk of this is greatest during the first 7-10 days after a head injury. Avoid injuries by: °· Wearing a   seat belt when riding in a car.  Drinking alcohol only in moderation.  Wearing a helmet when biking, skiing, skateboarding, skating, or doing similar activities.  Avoiding activities that could lead to a second concussion, such as contact or recreational sports, until your health care provider says it is okay.  Taking safety measures in your home.  Remove clutter and tripping hazards from floors and stairways.  Use grab bars in bathrooms and handrails by stairs.  Place non-slip mats on floors and in bathtubs.  Improve lighting in dim areas. SEEK MEDICAL CARE IF:  You have increased problems paying attention or  concentrating.  You have increased difficulty remembering or learning new information.  You need more time to complete tasks or assignments than before.  You have increased irritability or decreased ability to cope with stress.  You have more symptoms than before. Seek medical care if you have any of the following symptoms for more than 2 weeks after your injury:  Lasting (chronic) headaches.  Dizziness or balance problems.  Nausea.  Vision problems.  Increased sensitivity to noise or light.  Depression or mood swings.  Anxiety or irritability.  Memory problems.  Difficulty concentrating or paying attention.  Sleep problems.  Feeling tired all the time. SEEK IMMEDIATE MEDICAL CARE IF:  You have severe or worsening headaches. These may be a sign of a blood clot in the brain.  You have weakness (even if only in one hand, leg, or part of the face).  You have numbness.  You have decreased coordination.  You vomit repeatedly.  You have increased sleepiness.  One pupil is larger than the other.  You have convulsions.  You have slurred speech.  You have increased confusion. This may be a sign of a blood clot in the brain.  You have increased restlessness, agitation, or irritability.  You are unable to recognize people or places.  You have neck pain.  It is difficult to wake you up.  You have unusual behavior changes.  You lose consciousness. MAKE SURE YOU:  Understand these instructions.  Will watch your condition.  Will get help right away if you are not doing well or get worse.   This information is not intended to replace advice given to you by your health care provider. Make sure you discuss any questions you have with your health care provider.   Document Released: 12/17/2003 Document Revised: 10/17/2014 Document Reviewed: 04/18/2013 Elsevier Interactive Patient Education 2016 [employer]  Musculoskeletal Pain Musculoskeletal pain is  muscle and boney aches and pains. These pains can occur in any part of the body. Your caregiver may treat you without knowing the cause of the pain. They may treat you if blood or urine tests, X-rays, and other tests were normal.  CAUSES There is often not a definite cause or reason for these pains. These pains may be caused by a type of germ (virus). The discomfort may also come from overuse. Overuse includes working out too hard when your body is not fit. Boney aches also come from weather changes. Bone is sensitive to atmospheric pressure changes. HOME CARE INSTRUCTIONS   Ask when your test results will be ready. Make sure you get your test results.  Only take over-the-counter or prescription medicines for pain, discomfort, or fever as directed by your caregiver. If you were given medications for your condition, do not drive, operate machinery or power tools, or sign legal documents for 24 hours. Do not drink alcohol. Do not take sleeping pills  or other medications that may interfere with treatment.  Continue all activities unless the activities cause more pain. When the pain lessens, slowly resume normal activities. Gradually increase the intensity and duration of the activities or exercise.  During periods of severe pain, bed rest may be helpful. Lay or sit in any position that is comfortable.  Putting ice on the injured area.  Put ice in a bag.  Place a towel between your skin and the bag.  Leave the ice on for 15 to 20 minutes, 3 to 4 times a day.  Follow up with your caregiver for continued problems and no reason can be found for the pain. If the pain becomes worse or does not go away, it may be necessary to repeat tests or do additional testing. Your caregiver may need to look further for a possible cause. SEEK IMMEDIATE MEDICAL CARE IF:  You have pain that is getting worse and is not relieved by medications.  You develop chest pain that is associated with shortness or breath,  sweating, feeling sick to your stomach (nauseous), or throw up (vomit).  Your pain becomes localized to the abdomen.  You develop any new symptoms that seem different or that concern you. MAKE SURE YOU:   Understand these instructions.  Will watch your condition.  Will get help right away if you are not doing well or get worse.   This information is not intended to replace advice given to you by your health care provider. Make sure you discuss any questions you have with your health care provider.  Apply ice to affected area. Take pain medication as needed. Follow-up with your primary care doctor for reevaluation. Return to the ED if you experience loss of consciousness, blurry vision, vomiting, loss of control of your bowels or bladder, numbness or tingling in both of your lower extremities, increased weakness.

## 2016-04-18 NOTE — ED Notes (Signed)
Pt in radiology 

## 2016-04-18 NOTE — Telephone Encounter (Signed)
Keep taking propranolol to prevent SVT. The stress test is reassuring that there are no artery blockages at this time.  Dr. Rexene Edison

## 2016-04-18 NOTE — ED Notes (Signed)
Complains of posterior neck pain, c-collar applied on arrival

## 2016-04-18 NOTE — ED Notes (Signed)
Patient walk to the bathroom with no problem was assisted back to sitting position

## 2016-04-18 NOTE — ED Notes (Signed)
Patient tripped over dog while in her home and fell straight backwards hitting back of head, no loc. Complains of nausea and blurred vision, patient alert and oriented but states she feels bad. Also complains of left hip pain, pain with any lifting of leg

## 2016-04-21 ENCOUNTER — Other Ambulatory Visit: Payer: Self-pay

## 2016-04-26 ENCOUNTER — Other Ambulatory Visit: Payer: Self-pay

## 2016-04-26 DIAGNOSIS — R002 Palpitations: Secondary | ICD-10-CM

## 2016-04-26 MED ORDER — PROPRANOLOL HCL ER 60 MG PO CP24: 60.0000 mg | ORAL_CAPSULE | Freq: Every day | ORAL | Status: DC

## 2016-04-26 NOTE — Telephone Encounter (Signed)
Rx(s) sent to pharmacy electronically.  

## 2016-05-09 ENCOUNTER — Encounter: Payer: Self-pay | Admitting: Internal Medicine

## 2016-05-09 ENCOUNTER — Ambulatory Visit (INDEPENDENT_AMBULATORY_CARE_PROVIDER_SITE_OTHER): Payer: BLUE CROSS/BLUE SHIELD | Admitting: Internal Medicine

## 2016-05-09 VITALS — BP 119/69 | HR 55 | Ht 67.0 in | Wt 114.0 lb

## 2016-05-09 DIAGNOSIS — R079 Chest pain, unspecified: Secondary | ICD-10-CM

## 2016-05-09 DIAGNOSIS — E785 Hyperlipidemia, unspecified: Secondary | ICD-10-CM

## 2016-05-09 DIAGNOSIS — R002 Palpitations: Secondary | ICD-10-CM

## 2016-05-09 DIAGNOSIS — R9431 Abnormal electrocardiogram [ECG] [EKG]: Secondary | ICD-10-CM

## 2016-05-09 DIAGNOSIS — Z8249 Family history of ischemic heart disease and other diseases of the circulatory system: Secondary | ICD-10-CM | POA: Diagnosis not present

## 2016-05-09 NOTE — Patient Instructions (Signed)
Medication Instructions:  Your physician recommends that you continue on your current medications as directed. Please refer to the Current Medication list given to you today.  Labwork: None Ordered  Testing/Procedures: None Ordered  Follow-Up: Your physician wants you to follow-up in: 1 YEAR with DR HILTY. You will receive a reminder letter in the mail two months in advance. If you don't receive a letter, please call our office to schedule the follow-up appointment.   Any Other Special Instructions Will Be Listed Below (If Applicable).     If you need a refill on your cardiac medications before your next appointment, please call your pharmacy.
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
## 2016-05-18 ENCOUNTER — Ambulatory Visit: Payer: BLUE CROSS/BLUE SHIELD | Admitting: Internal Medicine

## 2016-07-14 ENCOUNTER — Ambulatory Visit (INDEPENDENT_AMBULATORY_CARE_PROVIDER_SITE_OTHER): Payer: BLUE CROSS/BLUE SHIELD | Admitting: Physician Assistant

## 2016-07-14 ENCOUNTER — Encounter: Payer: Self-pay | Admitting: Physician Assistant

## 2016-07-14 ENCOUNTER — Telehealth: Payer: Self-pay | Admitting: Internal Medicine

## 2016-07-14 VITALS — BP 122/68 | HR 67 | Ht 66.0 in | Wt 115.2 lb

## 2016-07-14 DIAGNOSIS — R002 Palpitations: Secondary | ICD-10-CM | POA: Diagnosis not present

## 2016-07-14 DIAGNOSIS — I471 Supraventricular tachycardia: Secondary | ICD-10-CM

## 2016-07-14 MED ORDER — PROPRANOLOL HCL ER 80 MG PO CP24: 80.0000 mg | ORAL_CAPSULE | Freq: Every day | ORAL | 9 refills | Status: DC
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
## 2016-07-14 NOTE — Patient Instructions (Addendum)
[email or web address].alivecor.com/ Use device, app or watch to check Heart rate when having palpitations. Document heart rates with palpitations Keep symptom diary to track frequency, duration, and timing of episodes.  Medication Instructions:  INCREASE- Inderal 80 mg daily  Labwork: None Ordered  Testing/Procedures: None Ordered  Follow-Up: Call for appointment if need to be seen sooner.  Any Other Special Instructions Will Be Listed Below (If Applicable).     If you need a refill on your cardiac medications before your next appointment, please call your pharmacy.

## 2016-07-14 NOTE — Telephone Encounter (Signed)
Pt c/o increase in palps/SVT, for which she's been eval'd in the past and takes atenolol. Notes this has been going on for about 2 weeks, and would like to be seen.  She has no other symptoms, no c/o SOB, fatigue, chest pain, etc. Patient states she is OK to see PA - I scheduled her for visit tomorrow AM w Bjorn Loser. Patient aware of appt info, and to call if new concerns.

## 2016-07-14 NOTE — Telephone Encounter (Signed)
Pt been having episodes of SVT's for the last 2 weeks.The patient said she had a really bad episode last night. Pt was wondering if medicine might need to be changed or if she need to be seen.

## 2016-07-15 ENCOUNTER — Ambulatory Visit: Payer: BLUE CROSS/BLUE SHIELD | Admitting: Physician Assistant

## 2016-08-05 ENCOUNTER — Other Ambulatory Visit: Payer: Self-pay | Admitting: Family Medicine

## 2016-08-05 DIAGNOSIS — I6529 Occlusion and stenosis of unspecified carotid artery: Secondary | ICD-10-CM

## 2016-08-11 ENCOUNTER — Ambulatory Visit
Admission: RE | Admit: 2016-08-11 | Discharge: 2016-08-11 | Disposition: A | Discharge status: Home / Self Care | Payer: BLUE CROSS/BLUE SHIELD | Source: Ambulatory Visit | Attending: Family Medicine | Admitting: Family Medicine

## 2016-08-11 DIAGNOSIS — I6529 Occlusion and stenosis of unspecified carotid artery: Secondary | ICD-10-CM

## 2016-08-19 ENCOUNTER — Ambulatory Visit: Payer: BLUE CROSS/BLUE SHIELD | Admitting: Neurology

## 2016-11-15 ENCOUNTER — Telehealth: Payer: Self-pay | Admitting: Internal Medicine

## 2016-11-15 MED ORDER — PROPRANOLOL HCL ER 80 MG PO CP24
80.0000 mg | ORAL_CAPSULE | Freq: Every day | ORAL | 9 refills | Status: DC
Start: 2016-11-15 — End: 2017-02-20

## 2016-11-15 NOTE — Telephone Encounter (Signed)
Refill sent to the pharmacy electronically.  

## 2016-11-15 NOTE — Telephone Encounter (Signed)
New message        *STAT* If patient is at the pharmacy, call can be transferred to refill team.   1. Which medications need to be refilled? (please list name of each medication and dose if known) propranolol 80mg   2. Which pharmacy/location (including street and city if local pharmacy) is medication to be sent to? [facility] [address] in [place]. Do they need a 30 day or 90 day supply?  Want 2 week supply.  Presc from [facility] pharmacy has not come and pt is almost out

## 2016-11-21 ENCOUNTER — Other Ambulatory Visit: Payer: Self-pay | Admitting: Advanced Practice Midwife

## 2016-11-21 ENCOUNTER — Other Ambulatory Visit: Payer: Self-pay | Admitting: Family Medicine

## 2016-11-21 DIAGNOSIS — Z1231 Encounter for screening mammogram for malignant neoplasm of breast: Secondary | ICD-10-CM

## 2016-11-24 ENCOUNTER — Ambulatory Visit
Admission: RE | Admit: 2016-11-24 | Discharge: 2016-11-24 | Disposition: A | Discharge status: Home / Self Care | Payer: BLUE CROSS/BLUE SHIELD | Source: Ambulatory Visit | Attending: Family Medicine | Admitting: Family Medicine

## 2016-11-24 DIAGNOSIS — Z1231 Encounter for screening mammogram for malignant neoplasm of breast: Secondary | ICD-10-CM

## 2017-02-20 ENCOUNTER — Other Ambulatory Visit: Payer: Self-pay | Admitting: Internal Medicine

## 2017-02-20 MED ORDER — PROPRANOLOL HCL ER 80 MG PO CP24: 80.0000 mg | ORAL_CAPSULE | Freq: Every day | ORAL | 0 refills | Status: DC

## 2017-02-20 NOTE — Telephone Encounter (Signed)
Rx(s) sent to pharmacy electronically.  

## 2017-02-20 NOTE — Telephone Encounter (Signed)
New message       *STAT* If patient is at the pharmacy, call can be transferred to refill team.   1. Which medications need to be refilled? (please list name of each medication and dose if known)  Propranolol 80mg  2. Which pharmacy/location (including street and city if local pharmacy) is medication to be sent to? [facility] [address] in [place]. Do they need a 30 day or 90 day supply?  90 day if ins will pay----pt has 1 pill left

## 2017-04-22 ENCOUNTER — Emergency Department
Admission: EM | Admit: 2017-04-22 | Discharge: 2017-04-22 | Disposition: A | Discharge status: Home / Self Care | Payer: BC Managed Care – PPO | Attending: Emergency Medicine | Admitting: Emergency Medicine

## 2017-04-22 ENCOUNTER — Emergency Department: Payer: BC Managed Care – PPO

## 2017-04-22 DIAGNOSIS — Z791 Long term (current) use of non-steroidal anti-inflammatories (NSAID): Secondary | ICD-10-CM | POA: Insufficient documentation

## 2017-04-22 DIAGNOSIS — Z79899 Other long term (current) drug therapy: Secondary | ICD-10-CM | POA: Diagnosis not present

## 2017-04-22 DIAGNOSIS — R1032 Left lower quadrant pain: Secondary | ICD-10-CM | POA: Diagnosis not present

## 2017-04-22 DIAGNOSIS — N200 Calculus of kidney: Secondary | ICD-10-CM

## 2017-04-22 DIAGNOSIS — R109 Unspecified abdominal pain: Secondary | ICD-10-CM

## 2017-04-22 LAB — COMPREHENSIVE METABOLIC PANEL
ALT: 24 U/L (ref 14–54)
AST: 33 U/L (ref 15–41)
Albumin: 3.9 g/dL (ref 3.5–5.0)
Alkaline Phosphatase: 49 U/L (ref 38–126)
Anion gap: 9 (ref 5–15)
BUN: 31 mg/dL — ABNORMAL HIGH (ref 6–20)
CHLORIDE: 108 mmol/L (ref 101–111)
CO2: 25 mmol/L (ref 22–32)
CREATININE: 0.67 mg/dL (ref 0.44–1.00)
Calcium: 9.2 mg/dL (ref 8.9–10.3)
Glucose, Bld: 147 mg/dL — ABNORMAL HIGH (ref 65–99)
Potassium: 4.1 mmol/L (ref 3.5–5.1)
Sodium: 142 mmol/L (ref 135–145)
Total Bilirubin: 1.1 mg/dL (ref 0.3–1.2)
Total Protein: 6.6 g/dL (ref 6.5–8.1)

## 2017-04-22 LAB — URINALYSIS, COMPLETE (UACMP) WITH MICROSCOPIC
BILIRUBIN URINE: NEGATIVE
Bacteria, UA: NONE SEEN
Glucose, UA: 50 mg/dL — AB
KETONES UR: NEGATIVE mg/dL
LEUKOCYTES UA: NEGATIVE
NITRITE: NEGATIVE
PH: 7 (ref 5.0–8.0)
Protein, ur: NEGATIVE mg/dL
Specific Gravity, Urine: 1.016 (ref 1.005–1.030)

## 2017-04-22 LAB — CBC
HCT: 37.9 % (ref 35.0–47.0)
Hemoglobin: 12.9 g/dL (ref 12.0–16.0)
MCH: 30.8 pg (ref 26.0–34.0)
MCHC: 34 g/dL (ref 32.0–36.0)
MCV: 90.8 fL (ref 80.0–100.0)
PLATELETS: 211 10*3/uL (ref 150–440)
RBC: 4.17 MIL/uL (ref 3.80–5.20)
RDW: 12.8 % (ref 11.5–14.5)
WBC: 5.8 10*3/uL (ref 3.6–11.0)

## 2017-04-22 LAB — LIPASE, BLOOD: LIPASE: 76 U/L — AB (ref 11–51)

## 2017-04-22 MED ORDER — OXYCODONE-ACETAMINOPHEN 5-325 MG PO TABS: 1.0000 | ORAL_TABLET | Freq: Four times a day (QID) | ORAL | 0 refills | Status: DC | PRN

## 2017-04-22 MED ORDER — TAMSULOSIN HCL 0.4 MG PO CAPS: 0.4000 mg | ORAL_CAPSULE | Freq: Every day | ORAL | 0 refills | Status: DC

## 2017-04-22 MED ORDER — KETOROLAC TROMETHAMINE 30 MG/ML IJ SOLN
30.0000 mg | Freq: Once | INTRAMUSCULAR | Status: AC
  Administered 2017-04-22: 30 mg via INTRAVENOUS
  Filled 2017-04-22: qty 1

## 2017-04-22 MED ORDER — ONDANSETRON HCL 4 MG/2ML IJ SOLN
4.0000 mg | Freq: Once | INTRAMUSCULAR | Status: AC
  Administered 2017-04-22: 4 mg via INTRAVENOUS
  Filled 2017-04-22: qty 2

## 2017-04-22 MED ORDER — FENTANYL CITRATE (PF) 100 MCG/2ML IJ SOLN
100.0000 ug | Freq: Once | INTRAMUSCULAR | Status: AC
  Administered 2017-04-22: 100 ug via INTRAVENOUS
  Filled 2017-04-22: qty 2

## 2017-04-22 NOTE — ED Triage Notes (Signed)
Pt came to ED via EMS c/o left sided flank pain starting this am. History of back pain and MS. 4mg  zofran given by EMS. Afebrile.
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
## 2017-04-27 ENCOUNTER — Ambulatory Visit (INDEPENDENT_AMBULATORY_CARE_PROVIDER_SITE_OTHER): Payer: BC Managed Care – PPO | Admitting: Urology

## 2017-04-27 ENCOUNTER — Ambulatory Visit
Admission: RE | Admit: 2017-04-27 | Discharge: 2017-04-27 | Disposition: A | Discharge status: Home / Self Care | Payer: BC Managed Care – PPO | Source: Ambulatory Visit | Attending: Urology | Admitting: Urology

## 2017-04-27 ENCOUNTER — Encounter: Payer: Self-pay | Admitting: Urology

## 2017-04-27 VITALS — BP 117/70 | HR 58 | Ht 65.0 in | Wt 113.5 lb

## 2017-04-27 DIAGNOSIS — N132 Hydronephrosis with renal and ureteral calculous obstruction: Secondary | ICD-10-CM

## 2017-04-27 DIAGNOSIS — N201 Calculus of ureter: Secondary | ICD-10-CM

## 2017-04-27 DIAGNOSIS — R109 Unspecified abdominal pain: Secondary | ICD-10-CM | POA: Insufficient documentation

## 2017-04-27 DIAGNOSIS — R3129 Other microscopic hematuria: Secondary | ICD-10-CM

## 2017-04-27 LAB — URINALYSIS, COMPLETE
BILIRUBIN UA: NEGATIVE
GLUCOSE, UA: NEGATIVE
KETONES UA: NEGATIVE
Leukocytes, UA: NEGATIVE
Nitrite, UA: NEGATIVE
PROTEIN UA: NEGATIVE
RBC, UA: NEGATIVE
Specific Gravity, UA: 1.02 (ref 1.005–1.030)
UUROB: 0.2 mg/dL (ref 0.2–1.0)
pH, UA: 6.5 (ref 5.0–7.5)

## 2017-04-28 ENCOUNTER — Telehealth: Payer: Self-pay | Admitting: Urology

## 2017-04-28 DIAGNOSIS — N200 Calculus of kidney: Secondary | ICD-10-CM

## 2017-04-28 NOTE — Telephone Encounter (Signed)
Please let [name] no that on her x-ray it appears that the stone has migrated down close to her bladder. She should be passing the stone fairly soon. If she would push fluids and try the tamsulosin daily, it should come out over the weekend. I like to see her again in 1 week with a KUB prior to her appointment. If she should capture the stone, she should bring it with her so we can send it off for analysis. If she should develop fevers higher than 102, intractable nausea, vomiting and/or pain, she should seek treatment in the emergency department.

## 2017-05-01 LAB — CULTURE, URINE COMPREHENSIVE

## 2017-05-02 NOTE — Telephone Encounter (Signed)
LMOM for patient to return call.

## 2017-05-02 NOTE — Telephone Encounter (Signed)
Spoke with patient and gave results. Angie to call patient back and make one week follow up appointment. KUB ordered. All instructions given to patient. Patient ok with plan.

## 2017-05-02 NOTE — Telephone Encounter (Signed)
Pt returned call and would like for someone to give her a call back. [phone]

## 2017-05-05 ENCOUNTER — Encounter: Payer: Self-pay | Admitting: Internal Medicine

## 2017-05-05 ENCOUNTER — Ambulatory Visit (INDEPENDENT_AMBULATORY_CARE_PROVIDER_SITE_OTHER): Payer: BC Managed Care – PPO | Admitting: Internal Medicine

## 2017-05-05 VITALS — BP 110/52 | HR 66 | Ht 65.0 in | Wt 113.0 lb

## 2017-05-05 DIAGNOSIS — G35 Multiple sclerosis: Secondary | ICD-10-CM | POA: Diagnosis not present

## 2017-05-05 DIAGNOSIS — R002 Palpitations: Secondary | ICD-10-CM

## 2017-05-05 NOTE — Patient Instructions (Signed)
Your physician wants you to follow-up in: ONE YEAR with Dr. Hilty. You will receive a reminder letter in the mail two months in advance. If you don't receive a letter, please call our office to schedule the follow-up appointment.  
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
## 2017-05-10 ENCOUNTER — Ambulatory Visit
Admission: RE | Admit: 2017-05-10 | Discharge: 2017-05-10 | Disposition: A | Discharge status: Home / Self Care | Payer: BC Managed Care – PPO | Source: Ambulatory Visit | Attending: Urology | Admitting: Urology

## 2017-05-10 DIAGNOSIS — N200 Calculus of kidney: Secondary | ICD-10-CM | POA: Diagnosis present
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
## 2017-05-11 ENCOUNTER — Encounter: Payer: Self-pay | Admitting: Urology

## 2017-05-11 ENCOUNTER — Ambulatory Visit (INDEPENDENT_AMBULATORY_CARE_PROVIDER_SITE_OTHER): Payer: BC Managed Care – PPO | Admitting: Urology

## 2017-05-11 VITALS — BP 110/64 | HR 63 | Ht 65.0 in | Wt 116.2 lb

## 2017-05-11 DIAGNOSIS — N200 Calculus of kidney: Secondary | ICD-10-CM

## 2017-05-11 DIAGNOSIS — N132 Hydronephrosis with renal and ureteral calculous obstruction: Secondary | ICD-10-CM

## 2017-05-11 DIAGNOSIS — N201 Calculus of ureter: Secondary | ICD-10-CM | POA: Diagnosis not present

## 2017-05-25 ENCOUNTER — Ambulatory Visit
Admission: RE | Admit: 2017-05-25 | Discharge: 2017-05-25 | Disposition: A | Discharge status: Home / Self Care | Payer: BC Managed Care – PPO | Source: Ambulatory Visit | Attending: Urology | Admitting: Urology

## 2017-05-25 DIAGNOSIS — Z87442 Personal history of urinary calculi: Secondary | ICD-10-CM | POA: Insufficient documentation

## 2017-05-25 DIAGNOSIS — N202 Calculus of kidney with calculus of ureter: Secondary | ICD-10-CM | POA: Diagnosis present

## 2017-05-25 DIAGNOSIS — N201 Calculus of ureter: Secondary | ICD-10-CM

## 2017-05-25 DIAGNOSIS — Z09 Encounter for follow-up examination after completed treatment for conditions other than malignant neoplasm: Secondary | ICD-10-CM | POA: Diagnosis not present
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
## 2017-06-01 ENCOUNTER — Ambulatory Visit (INDEPENDENT_AMBULATORY_CARE_PROVIDER_SITE_OTHER): Payer: BC Managed Care – PPO | Admitting: Urology

## 2017-06-01 ENCOUNTER — Encounter: Payer: Self-pay | Admitting: Urology

## 2017-06-01 VITALS — BP 126/79 | HR 56 | Ht 65.0 in | Wt 115.4 lb

## 2017-06-01 DIAGNOSIS — N132 Hydronephrosis with renal and ureteral calculous obstruction: Secondary | ICD-10-CM | POA: Diagnosis not present

## 2017-06-01 DIAGNOSIS — N201 Calculus of ureter: Secondary | ICD-10-CM | POA: Diagnosis not present

## 2017-08-14 ENCOUNTER — Emergency Department
Admission: EM | Admit: 2017-08-14 | Discharge: 2017-08-14 | Disposition: A | Discharge status: Home / Self Care | Payer: BC Managed Care – PPO | Attending: Emergency Medicine | Admitting: Emergency Medicine

## 2017-08-14 ENCOUNTER — Telehealth: Payer: Self-pay | Admitting: Internal Medicine

## 2017-08-14 ENCOUNTER — Encounter: Payer: Self-pay | Admitting: Emergency Medicine

## 2017-08-14 ENCOUNTER — Other Ambulatory Visit: Payer: Self-pay

## 2017-08-14 ENCOUNTER — Emergency Department: Payer: BC Managed Care – PPO

## 2017-08-14 DIAGNOSIS — Z79899 Other long term (current) drug therapy: Secondary | ICD-10-CM | POA: Insufficient documentation

## 2017-08-14 DIAGNOSIS — R079 Chest pain, unspecified: Secondary | ICD-10-CM | POA: Insufficient documentation

## 2017-08-14 DIAGNOSIS — R002 Palpitations: Secondary | ICD-10-CM | POA: Insufficient documentation

## 2017-08-14 HISTORY — DX: Gastro-esophageal reflux disease without esophagitis: K21.9

## 2017-08-14 LAB — CBC
HCT: 39.4 % (ref 35.0–47.0)
Hemoglobin: 13.2 g/dL (ref 12.0–16.0)
MCH: 30.4 pg (ref 26.0–34.0)
MCHC: 33.4 g/dL (ref 32.0–36.0)
MCV: 91.1 fL (ref 80.0–100.0)
Platelets: 181 10*3/uL (ref 150–440)
RBC: 4.33 MIL/uL (ref 3.80–5.20)
RDW: 13.5 % (ref 11.5–14.5)
WBC: 5.4 10*3/uL (ref 3.6–11.0)

## 2017-08-14 LAB — BASIC METABOLIC PANEL
ANION GAP: 7 (ref 5–15)
BUN: 20 mg/dL (ref 6–20)
CALCIUM: 9.3 mg/dL (ref 8.9–10.3)
CO2: 28 mmol/L (ref 22–32)
Chloride: 105 mmol/L (ref 101–111)
Creatinine, Ser: 0.82 mg/dL (ref 0.44–1.00)
GFR calc Af Amer: >60 mL/min (ref >60)
GLUCOSE: 116 mg/dL — AB (ref 65–99)
Potassium: 3.8 mmol/L (ref 3.5–5.1)
Sodium: 140 mmol/L (ref 135–145)

## 2017-08-14 LAB — TROPONIN I

## 2017-08-14 MED ORDER — ASPIRIN 81 MG PO CHEW
CHEWABLE_TABLET | ORAL | Status: AC
  Administered 2017-08-14: 324 mg via ORAL
  Filled 2017-08-14: qty 4

## 2017-08-14 MED ORDER — ASPIRIN 81 MG PO CHEW
324.0000 mg | CHEWABLE_TABLET | Freq: Once | ORAL | Status: AC
  Administered 2017-08-14: 324 mg via ORAL
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
## 2017-08-14 NOTE — ED Triage Notes (Signed)
Pt reports central chest pain radiating to upper back that began yesterday. Pt reports associated SOB and cough. Pt describes pain as burning and pressure. Ambulatory to triage, no apparent distress noted.

## 2017-08-14 NOTE — ED Notes (Signed)
Pt c/o substernal burning chest pain that radiates behind the left shoulder since yesterday. Denies injury.. Pt is in NAD..Marland Kitchen

## 2017-08-14 NOTE — ED Notes (Signed)
ED Provider at bedside. 

## 2017-08-14 NOTE — Discharge Instructions (Signed)

## 2017-08-14 NOTE — Telephone Encounter (Signed)
Received all from patient, patient complaining of a burning sensation in chest since yesterday.  Reports she took tums and indigestion medication to make sure this wasn't causing the pain, medication did not help.   Denies SOB, palpitations, radiation to neck, arms or back, N/V/D.   Reports episode a couple of weeks ago as well but only lasted 1-2 hours and went away.  Denies increase in stress or caffeine.   Unable to take BP at current but patient took HR while on phone and it was 54.   Reports CP at current that is "painful".     Per chart review: OV with Dr. Rennis Golden last 04/2017, hx of MS, anxiety, CP, family hx of heart disease, palpitations.   Normal echo 02/2016, low risk stress test 2017.  Advised patient to proceed to ER for evaluation.   Patient aware and verbalized understanding.  Patient states she will go to [facility] for evaluation.

## 2017-08-18 ENCOUNTER — Encounter: Payer: Self-pay | Admitting: Physician Assistant

## 2017-08-18 ENCOUNTER — Ambulatory Visit (INDEPENDENT_AMBULATORY_CARE_PROVIDER_SITE_OTHER): Payer: BC Managed Care – PPO | Admitting: Physician Assistant

## 2017-08-18 VITALS — BP 111/61 | HR 64 | Ht 63.0 in | Wt 116.4 lb

## 2017-08-18 DIAGNOSIS — R079 Chest pain, unspecified: Secondary | ICD-10-CM

## 2017-08-18 DIAGNOSIS — G35 Multiple sclerosis: Secondary | ICD-10-CM | POA: Diagnosis not present

## 2017-08-18 LAB — TROPONIN I: Troponin I: <0.01 ng/mL (ref 0.00–0.04)

## 2017-08-18 MED ORDER — OMEPRAZOLE 20 MG PO CPDR: 20.0000 mg | DELAYED_RELEASE_CAPSULE | Freq: Every day | ORAL | 11 refills | Status: DC

## 2017-08-18 NOTE — Patient Instructions (Signed)
Medication Instructions:  START Prilosec 20 mg Take 1 capsule once a day   Labwork: Your physician recommends that you return for lab work in: TODAY-STAT TROPONIN   Testing/Procedures: None   Follow-Up: Your physician recommends that you schedule a follow-up appointment in: 1 week with Azalee Course, PA-C  Any Other Special Instructions Will Be Listed Below (If Applicable).  If you need a refill on your cardiac medications before your next appointment, please call your pharmacy.
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
## 2017-08-20 ENCOUNTER — Encounter: Payer: Self-pay | Admitting: Physician Assistant

## 2017-08-24 ENCOUNTER — Ambulatory Visit (INDEPENDENT_AMBULATORY_CARE_PROVIDER_SITE_OTHER): Payer: BC Managed Care – PPO | Admitting: Physician Assistant

## 2017-08-24 ENCOUNTER — Encounter: Payer: Self-pay | Admitting: Physician Assistant

## 2017-08-24 VITALS — BP 115/70 | HR 58 | Ht 63.0 in | Wt 116.0 lb

## 2017-08-24 DIAGNOSIS — R072 Precordial pain: Secondary | ICD-10-CM

## 2017-08-24 DIAGNOSIS — Z79899 Other long term (current) drug therapy: Secondary | ICD-10-CM

## 2017-08-24 DIAGNOSIS — G35 Multiple sclerosis: Secondary | ICD-10-CM

## 2017-08-24 LAB — BASIC METABOLIC PANEL
BUN/Creatinine Ratio: 30 — ABNORMAL HIGH (ref 12–28)
BUN: 21 mg/dL (ref 8–27)
CALCIUM: 9.8 mg/dL (ref 8.7–10.3)
CO2: 27 mmol/L (ref 20–29)
Chloride: 105 mmol/L (ref 96–106)
Creatinine, Ser: 0.7 mg/dL (ref 0.57–1.00)
GFR calc Af Amer: 107 mL/min/{1.73_m2} (ref >59)
GFR, EST NON AFRICAN AMERICAN: 93 mL/min/{1.73_m2} (ref >59)
Glucose: 84 mg/dL (ref 65–99)
POTASSIUM: 5.6 mmol/L — AB (ref 3.5–5.2)
Sodium: 146 mmol/L — ABNORMAL HIGH (ref 134–144)

## 2017-08-24 NOTE — Progress Notes (Signed)
Kidney function fine, but unclear why her potassium is elevated when it was normal 10 days ago. Unlikely to be the prilosec. She is not on any medication that can raise potassium either, suspect mild degree of hemolysis although the lab did not mention any hemolysis. recommend recheck potassium in 2 weeks.
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
## 2017-08-24 NOTE — Patient Instructions (Addendum)
Your physician has requested that you have cardiac CT. Cardiac computed tomography (CT) is a painless test that uses an x-ray machine to take clear, detailed pictures of your heart. For further information please visit [email or web address] Please follow instruction sheet as given.  We will call you with your test results. If your test is abnormal, we will schedule you a follow up appointment.   Your physician wants you to follow-up in: 6 months with Dr. Rennis Golden. You will receive a reminder letter in the mail two months in advance. If you don't receive a letter, please call our office to schedule the follow-up appointment.   Please arrive at the [facility] main entrance of [facility] 30-45 minutes prior to test start time  [facility] [address] [place], [place] [address] [phone]  Proceed to the [facility] Radiology Department (First Floor).  Please follow these instructions carefully (unless otherwise directed):  On the Night Before the Test: . Drink plenty of water. . Do not consume any caffeinated/decaffeinated beverages or chocolate 12 hours prior to your test. . Do not take any antihistamines 12 hours prior to your test.  On the Day of the Test: . Drink plenty of water. Do not drink any water within one hour of the test. . Do not eat any food 4 hours prior to the test. . You may take your regular medications prior to the test. . TAKE propranolol the AM of the test   After the Test: . Drink plenty of water. . After receiving IV contrast, you may experience a mild flushed feeling. This is normal. . On occasion, you may experience a mild rash up to 24 hours after the test. This is not dangerous. If this occurs, you can take Benadryl 25 mg and increase your fluid intake. . If you experience trouble breathing, this can be serious. If it is severe call 911 IMMEDIATELY. If it is mild, please call our office. . If you take any of these medications:  Glipizide/Metformin, Avandament, Glucavance, please do not take 48 hours after completing test.

## 2017-08-26 ENCOUNTER — Encounter: Payer: Self-pay | Admitting: Physician Assistant

## 2017-08-28 ENCOUNTER — Other Ambulatory Visit: Payer: Self-pay | Admitting: *Deleted

## 2017-08-28 DIAGNOSIS — Z79899 Other long term (current) drug therapy: Secondary | ICD-10-CM

## 2017-09-16 IMAGING — MR MR LUMBAR SPINE W/O CM
4 of 5 series · 25 of 48 positions shown · non-contrast
Comparison: 03/24/2014 plain film examination. 07/21/2009 lumbar
spine MR. 02/27/2014 CT abdomen and pelvis.

CLINICAL DATA: 64-year-old female with pain and weakness lower back
down left leg and foot for the past 4-5 weeks. Surgery 1994.
Multiple sclerosis. Subsequent encounter.

EXAM:
MRI LUMBAR SPINE WITHOUT CONTRAST
TECHNIQUE: Multiplanar, multisequence MR imaging of the lumbar spine was
performed. No intravenous contrast was administered.

[Series 3: T1 · sagittal · 4.0mm · 0.84mm/px · 6 of 14 slices shown (1 of 2)]
[im 1/14]
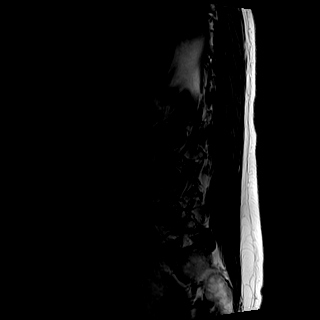
[im 3/14]
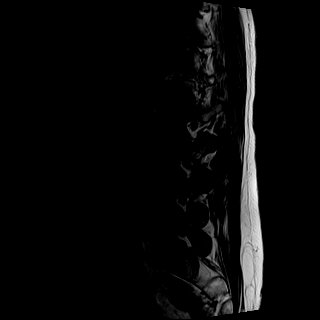
[im 6/14]
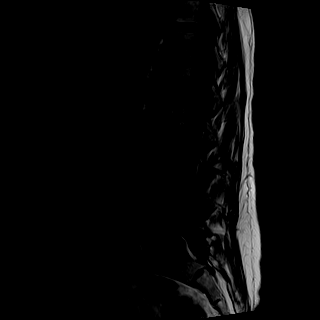
[im 8/14]
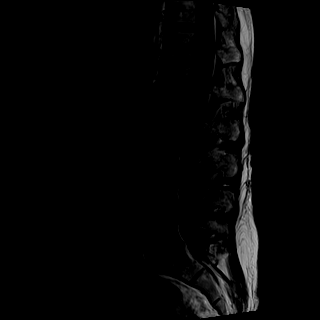
[im 11/14]
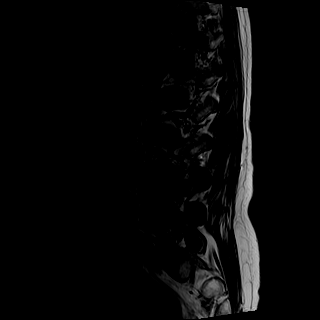
[im 14/14]
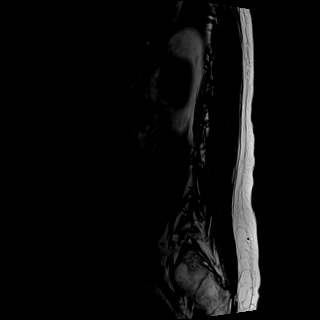

[Series 4: T2 · sagittal · 4.0mm · 0.42mm/px · 6 of 14 slices shown (1 of 2)]
[im 1/14]
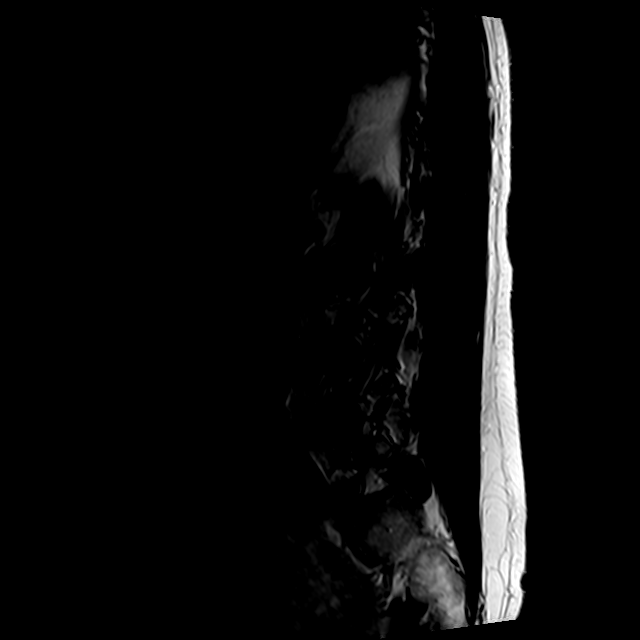
[im 3/14]
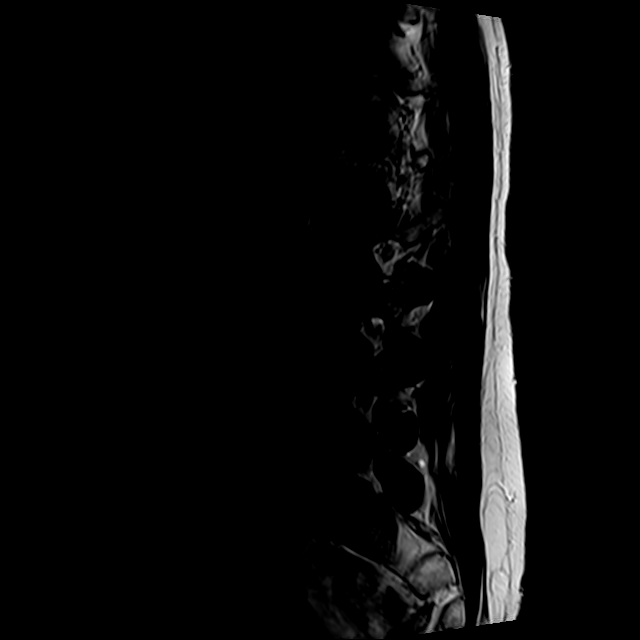
[im 6/14]
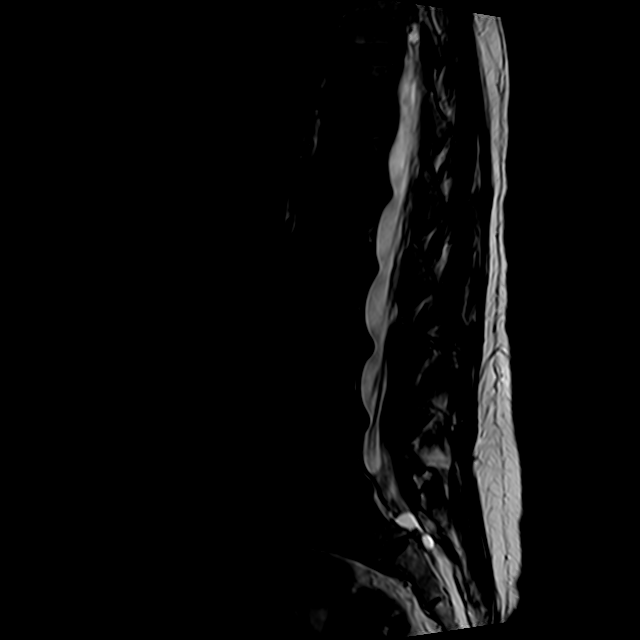
[im 8/14]
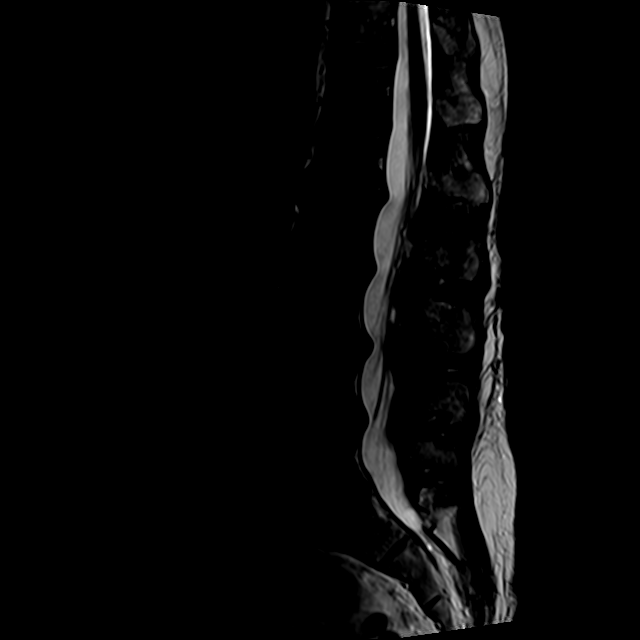
[im 11/14]
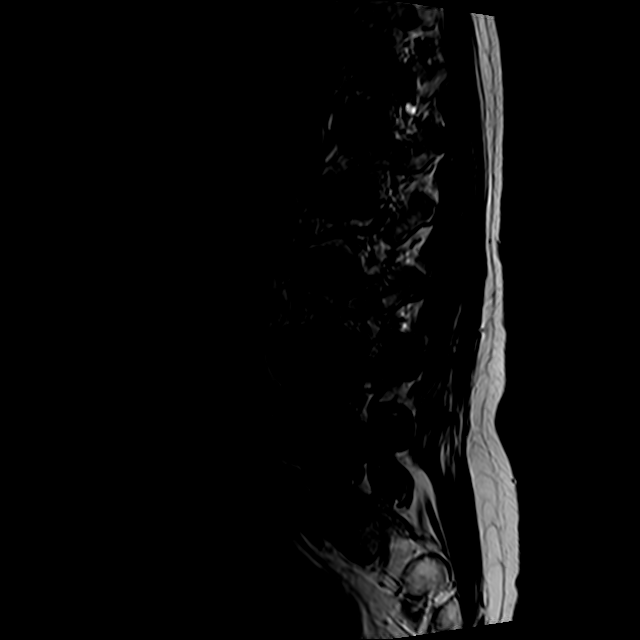
[im 14/14]
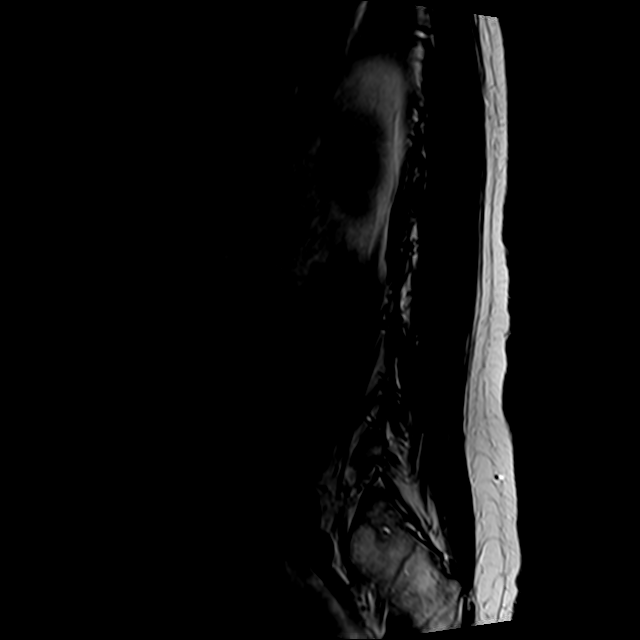

[Series 5: T2 · axial · 4.0mm · 0.70mm/px · z∈[-80,+96]mm · 9 of 34 slices shown (2 of 2)]
[im 1/34]
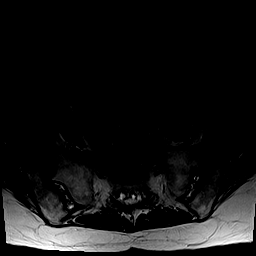
[im 5/34]
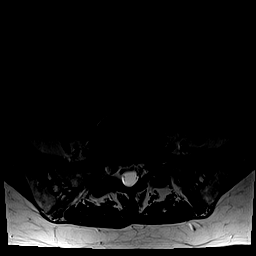
[im 10/34]
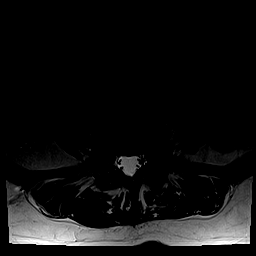
[im 15/34]
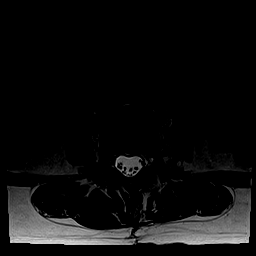
[im 17/34]
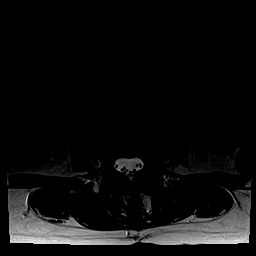
[im 19/34]
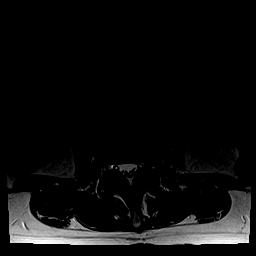
[im 24/34]
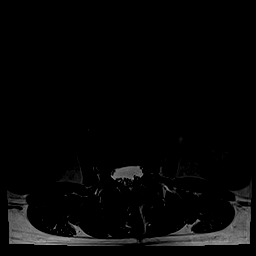
[im 29/34]
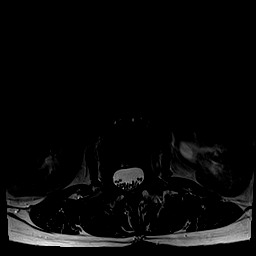
[im 34/34]
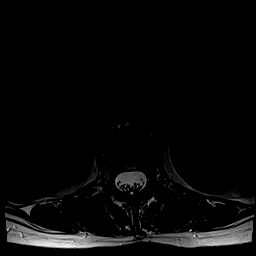

[Series 7: T1 · axial · 4.0mm · 0.35mm/px · z∈[-80,+70]mm · 4 of 34 slices shown (2 of 2)]
[im 1/34]
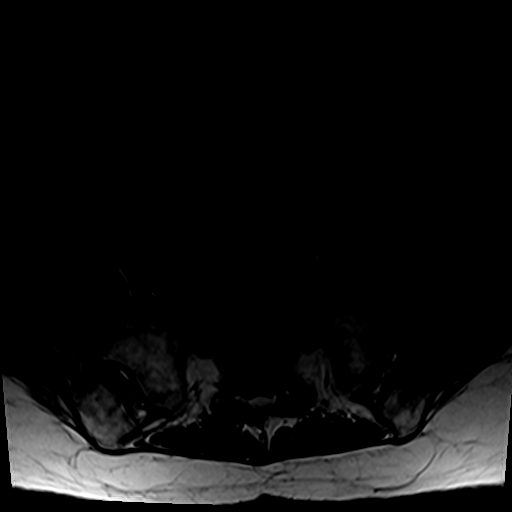
[im 5/34]
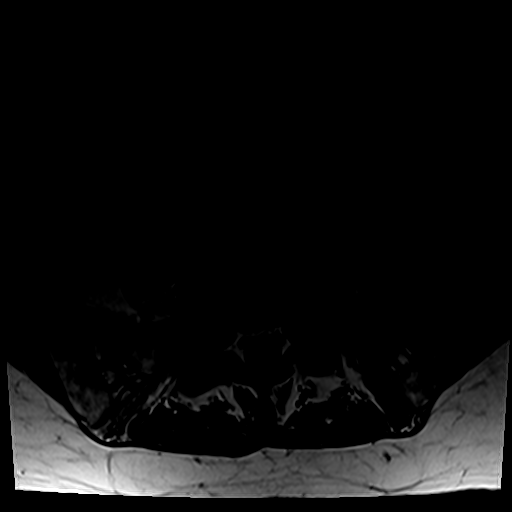
[im 17/34]
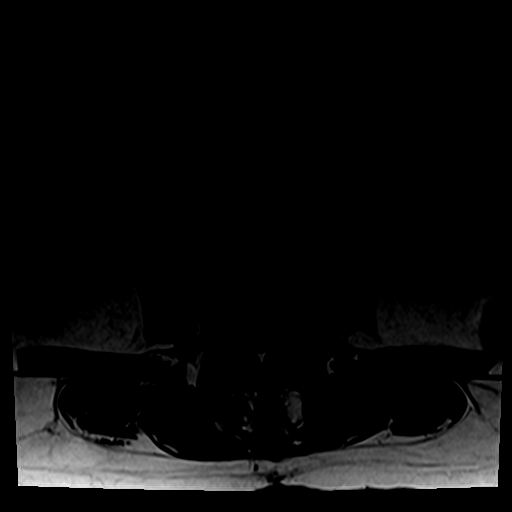
[im 29/34]
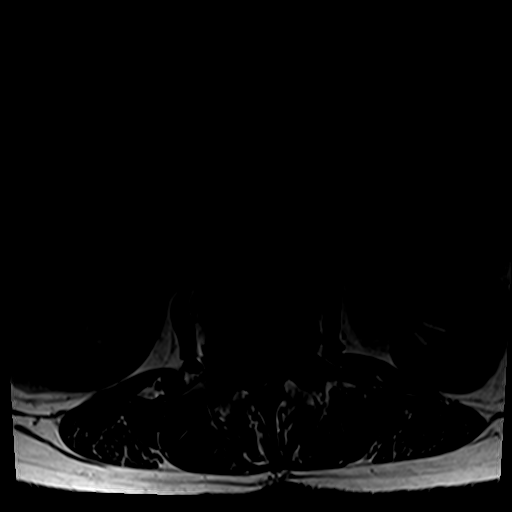

[25 of 48 positions shown; findings below may reference images not displayed]

FINDINGS: Segmentation: Last fully open disk space is labeled L5-S1. Present
examination incorporates from T11-12 disc space through the lower S3
level.

Alignment:  Mild scoliosis convex right.

Vertebrae: No worrisome abnormality. Small hemangioma a left aspect
L5. Scattered small focal fatty deposits.

Conus medullaris: Extends to the L1 level and appears normal.

Paraspinal and other soft tissues: No worrisome abnormality

Disc levels:

T11-12: Negative.

T12-L1:  Negative.

L1-2: Minimal bulge. Minimal facet degenerative changes. Minimal
rotation.

L2-3:  Mild bulge.  Minimal facet degenerative changes.

L3-4: Facet degenerative changes and ligamentum flavum hypertrophy
greater on the left. Rotation with moderate bulge slightly greater
left lateral position. Mild narrowing left neural foramen without
nerve root compression. Minimal narrowing lateral aspect of the
thecal sac greater on left without compression of the contained
nerve roots. Very mild spinal stenosis.

L4-5: Bulge. Minimal facet degenerative changes. Slight narrowing
lateral aspect of the thecal sac bilaterally with minimal crowding
of the contained L5 nerve roots which do not appear compressed.

L5-S1: Mild facet degenerative changes minimal bulge and tiny
annular fissure.

Small Tarlov cysts right aspect S1-2 level.
IMPRESSION: Slight progression of degenerative changes when compared to prior
examination resulting in:

L3-4 mild narrowing left neural foramen without nerve root
compression. Minimal narrowing lateral aspect of the thecal sac
greater on left without compression of the contained nerve roots.
Very mild spinal stenosis.

L4-5 mild narrowing lateral aspect of the thecal sac bilaterally
with minimal crowding of the contained L5 nerve roots which do not
appear compressed.

Please see above for further detail.

## 2017-09-26 ENCOUNTER — Ambulatory Visit (HOSPITAL_COMMUNITY): Payer: BC Managed Care – PPO

## 2017-09-26 ENCOUNTER — Ambulatory Visit (HOSPITAL_COMMUNITY)
Admission: RE | Admit: 2017-09-26 | Discharge: 2017-09-26 | Disposition: A | Discharge status: Home / Self Care | Payer: BC Managed Care – PPO | Source: Ambulatory Visit | Attending: Physician Assistant | Admitting: Physician Assistant

## 2017-09-26 DIAGNOSIS — R079 Chest pain, unspecified: Secondary | ICD-10-CM | POA: Diagnosis not present

## 2017-09-26 DIAGNOSIS — R072 Precordial pain: Secondary | ICD-10-CM | POA: Diagnosis not present

## 2017-09-26 MED ORDER — NITROGLYCERIN 0.4 MG SL SUBL
0.4000 mg | SUBLINGUAL_TABLET | Freq: Once | SUBLINGUAL | Status: AC
Start: 2017-09-26 — End: 2017-09-26
  Administered 2017-09-26: 0.4 mg via SUBLINGUAL

## 2017-09-26 MED ORDER — IOPAMIDOL (ISOVUE-370) INJECTION 76%
INTRAVENOUS | Status: AC
  Administered 2017-09-26: 100 mL
  Filled 2017-09-26: qty 100

## 2017-09-26 MED ORDER — NITROGLYCERIN 0.4 MG SL SUBL
SUBLINGUAL_TABLET | SUBLINGUAL | Status: AC
  Filled 2017-09-26: qty 1

## 2017-09-26 NOTE — Progress Notes (Signed)
CT scan completed. Tolerated well. D/C home walking. In no distress. 

## 2017-12-07 ENCOUNTER — Other Ambulatory Visit: Payer: Self-pay | Admitting: Internal Medicine

## 2018-02-27 ENCOUNTER — Other Ambulatory Visit: Payer: Self-pay | Admitting: Family Medicine

## 2018-02-27 DIAGNOSIS — Z1231 Encounter for screening mammogram for malignant neoplasm of breast: Secondary | ICD-10-CM

## 2018-03-21 ENCOUNTER — Ambulatory Visit
Admission: RE | Admit: 2018-03-21 | Discharge: 2018-03-21 | Disposition: A | Discharge status: Home / Self Care | Payer: Medicare Other | Source: Ambulatory Visit | Attending: Family Medicine | Admitting: Family Medicine

## 2018-03-21 ENCOUNTER — Ambulatory Visit: Payer: Medicare Other

## 2018-03-21 DIAGNOSIS — Z1231 Encounter for screening mammogram for malignant neoplasm of breast: Secondary | ICD-10-CM

## 2018-03-22 ENCOUNTER — Ambulatory Visit: Payer: BC Managed Care – PPO

## 2018-04-20 ENCOUNTER — Telehealth: Payer: Self-pay | Admitting: Internal Medicine

## 2018-04-20 NOTE — Telephone Encounter (Signed)
New message    Patient wants to switch from Dr Rennis Golden to Dr Ladona Ridgel , [address] is closer for her and she has SVT , and she wants Dr Ladona Ridgel because he sees her husband as well

## 2018-04-20 NOTE — Telephone Encounter (Signed)
That's ok with me. GT

## 2018-04-23 NOTE — Telephone Encounter (Signed)
That's fine with me as well.  Dr. H 

## 2018-05-29 ENCOUNTER — Ambulatory Visit
Admission: RE | Admit: 2018-05-29 | Discharge: 2018-05-29 | Disposition: A | Discharge status: Home / Self Care | Payer: Medicare Other | Source: Ambulatory Visit | Attending: Urology | Admitting: Urology

## 2018-05-29 DIAGNOSIS — N201 Calculus of ureter: Secondary | ICD-10-CM | POA: Insufficient documentation
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
## 2018-05-30 ENCOUNTER — Ambulatory Visit: Payer: Medicare Other | Admitting: Urology

## 2018-05-30 ENCOUNTER — Ambulatory Visit: Payer: Medicare Other | Admitting: Internal Medicine

## 2018-05-30 ENCOUNTER — Encounter: Payer: Self-pay | Admitting: Urology

## 2018-05-30 ENCOUNTER — Encounter: Payer: Self-pay | Admitting: Internal Medicine

## 2018-05-30 VITALS — BP 154/92 | HR 58 | Ht 67.0 in | Wt 122.8 lb

## 2018-05-30 VITALS — BP 117/76 | HR 60 | Ht 63.0 in | Wt 121.5 lb

## 2018-05-30 DIAGNOSIS — Z79899 Other long term (current) drug therapy: Secondary | ICD-10-CM

## 2018-05-30 DIAGNOSIS — R002 Palpitations: Secondary | ICD-10-CM | POA: Diagnosis not present

## 2018-05-30 DIAGNOSIS — Z87442 Personal history of urinary calculi: Secondary | ICD-10-CM | POA: Diagnosis not present

## 2018-05-30 LAB — BLADDER SCAN AMB NON-IMAGING: SCAN RESULT: 0

## 2018-05-30 MED ORDER — MIRABEGRON ER 25 MG PO TB24: 25.0000 mg | ORAL_TABLET | Freq: Every day | ORAL | 0 refills | Status: DC

## 2018-05-30 NOTE — Patient Instructions (Addendum)
Medication Instructions:  Your physician recommends that you continue on your current medications as directed. Please refer to the Current Medication list given to you today.  Labwork: None ordered.  Testing/Procedures: None ordered.  Follow-Up: Your physician wants you to follow-up in: as needed with Dr. Taylor.      Any Other Special Instructions Will Be Listed Below (If Applicable).  If you need a refill on your cardiac medications before your next appointment, please call your pharmacy.   
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
## 2018-05-31 ENCOUNTER — Ambulatory Visit: Payer: BC Managed Care – PPO | Admitting: Internal Medicine

## 2018-06-13 ENCOUNTER — Other Ambulatory Visit: Payer: Self-pay | Admitting: Internal Medicine

## 2018-06-13 NOTE — Telephone Encounter (Signed)
Rx request sent to pharmacy.  

## 2018-06-19 ENCOUNTER — Encounter: Payer: Self-pay | Admitting: Urology

## 2018-06-19 ENCOUNTER — Ambulatory Visit (INDEPENDENT_AMBULATORY_CARE_PROVIDER_SITE_OTHER): Payer: Medicare Other | Admitting: Urology

## 2018-06-19 VITALS — BP 94/60 | HR 75 | Ht 67.0 in | Wt 119.7 lb

## 2018-06-19 DIAGNOSIS — N201 Calculus of ureter: Secondary | ICD-10-CM

## 2018-06-19 LAB — BLADDER SCAN AMB NON-IMAGING
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
## 2018-07-04 ENCOUNTER — Other Ambulatory Visit: Payer: Self-pay | Admitting: Sports Medicine

## 2018-07-04 DIAGNOSIS — M25562 Pain in left knee: Secondary | ICD-10-CM

## 2018-07-19 ENCOUNTER — Telehealth: Payer: Self-pay | Admitting: Urology

## 2018-07-19 NOTE — Telephone Encounter (Signed)
Pt would like a RX of Myrbetriq 50 mg called in to [facility] on [address].  She would like a 90 day supply if possible.  She tried samples that Carollee Herter gave her and they worked really well, so she'd liike an Advertising account executive.  Please call pt to let her know when this is done.

## 2018-07-20 MED ORDER — MIRABEGRON ER 50 MG PO TB24: 50.0000 mg | ORAL_TABLET | Freq: Every day | ORAL | 11 refills | Status: DC

## 2018-07-20 NOTE — Telephone Encounter (Signed)
Medication sent to pharmacy  

## 2018-07-23 ENCOUNTER — Ambulatory Visit
Admission: RE | Admit: 2018-07-23 | Discharge: 2018-07-23 | Disposition: A | Discharge status: Home / Self Care | Payer: Medicare Other | Source: Ambulatory Visit | Attending: Sports Medicine | Admitting: Sports Medicine

## 2018-07-23 DIAGNOSIS — M7122 Synovial cyst of popliteal space [Baker], left knee: Secondary | ICD-10-CM | POA: Insufficient documentation

## 2018-07-23 DIAGNOSIS — M25562 Pain in left knee: Secondary | ICD-10-CM | POA: Diagnosis present

## 2018-09-10 DIAGNOSIS — R636 Underweight: Secondary | ICD-10-CM | POA: Diagnosis not present

## 2018-09-10 DIAGNOSIS — F411 Generalized anxiety disorder: Secondary | ICD-10-CM | POA: Diagnosis not present

## 2018-09-10 DIAGNOSIS — K580 Irritable bowel syndrome with diarrhea: Secondary | ICD-10-CM | POA: Diagnosis not present

## 2018-09-10 DIAGNOSIS — E782 Mixed hyperlipidemia: Secondary | ICD-10-CM | POA: Diagnosis not present

## 2018-09-10 DIAGNOSIS — G35 Multiple sclerosis: Secondary | ICD-10-CM | POA: Diagnosis not present

## 2018-09-10 DIAGNOSIS — H7391 Unspecified disorder of tympanic membrane, right ear: Secondary | ICD-10-CM | POA: Diagnosis not present

## 2018-09-18 ENCOUNTER — Ambulatory Visit: Payer: Medicare Other | Admitting: Urology

## 2018-09-18 DIAGNOSIS — Z0110 Encounter for examination of ears and hearing without abnormal findings: Secondary | ICD-10-CM | POA: Diagnosis not present

## 2018-09-18 DIAGNOSIS — H7390 Unspecified disorder of tympanic membrane, unspecified ear: Secondary | ICD-10-CM | POA: Diagnosis not present

## 2018-09-18 DIAGNOSIS — H9311 Tinnitus, right ear: Secondary | ICD-10-CM | POA: Diagnosis not present

## 2018-10-11 ENCOUNTER — Other Ambulatory Visit: Payer: Self-pay | Admitting: Neurosurgery

## 2018-10-11 DIAGNOSIS — Z6820 Body mass index (BMI) 20.0-20.9, adult: Secondary | ICD-10-CM | POA: Diagnosis not present

## 2018-10-11 DIAGNOSIS — M4712 Other spondylosis with myelopathy, cervical region: Secondary | ICD-10-CM | POA: Diagnosis not present
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
## 2018-10-22 ENCOUNTER — Ambulatory Visit
Admission: RE | Admit: 2018-10-22 | Discharge: 2018-10-22 | Disposition: A | Discharge status: Home / Self Care | Payer: Medicare Other | Source: Ambulatory Visit | Attending: Neurosurgery | Admitting: Neurosurgery

## 2018-10-22 ENCOUNTER — Other Ambulatory Visit: Payer: Self-pay | Admitting: Neurosurgery

## 2018-10-22 DIAGNOSIS — M4712 Other spondylosis with myelopathy, cervical region: Secondary | ICD-10-CM

## 2018-10-22 DIAGNOSIS — M542 Cervicalgia: Secondary | ICD-10-CM | POA: Diagnosis not present

## 2018-10-22 DIAGNOSIS — M47812 Spondylosis without myelopathy or radiculopathy, cervical region: Secondary | ICD-10-CM | POA: Diagnosis not present

## 2018-10-23 ENCOUNTER — Ambulatory Visit (INDEPENDENT_AMBULATORY_CARE_PROVIDER_SITE_OTHER): Payer: Medicare Other | Admitting: Urology

## 2018-10-23 ENCOUNTER — Encounter: Payer: Self-pay | Admitting: Urology

## 2018-10-23 VITALS — BP 94/57 | HR 56 | Ht 67.0 in | Wt 120.8 lb

## 2018-10-23 DIAGNOSIS — N132 Hydronephrosis with renal and ureteral calculous obstruction: Secondary | ICD-10-CM | POA: Diagnosis not present

## 2018-10-23 LAB — BLADDER SCAN AMB NON-IMAGING: Scan Result: 0

## 2018-11-06 DIAGNOSIS — G35 Multiple sclerosis: Secondary | ICD-10-CM | POA: Diagnosis not present

## 2018-11-06 DIAGNOSIS — M4712 Other spondylosis with myelopathy, cervical region: Secondary | ICD-10-CM | POA: Diagnosis not present

## 2018-11-19 ENCOUNTER — Other Ambulatory Visit: Payer: Self-pay | Admitting: Internal Medicine

## 2018-11-23 DIAGNOSIS — M1712 Unilateral primary osteoarthritis, left knee: Secondary | ICD-10-CM | POA: Diagnosis not present

## 2018-11-23 DIAGNOSIS — M25562 Pain in left knee: Secondary | ICD-10-CM | POA: Diagnosis not present

## 2018-11-23 DIAGNOSIS — M7122 Synovial cyst of popliteal space [Baker], left knee: Secondary | ICD-10-CM | POA: Diagnosis not present

## 2018-11-23 DIAGNOSIS — G8929 Other chronic pain: Secondary | ICD-10-CM | POA: Diagnosis not present

## 2018-12-10 DIAGNOSIS — M25562 Pain in left knee: Secondary | ICD-10-CM | POA: Diagnosis not present

## 2018-12-10 DIAGNOSIS — G8929 Other chronic pain: Secondary | ICD-10-CM | POA: Diagnosis not present

## 2018-12-10 DIAGNOSIS — M7122 Synovial cyst of popliteal space [Baker], left knee: Secondary | ICD-10-CM | POA: Diagnosis not present

## 2018-12-10 DIAGNOSIS — M1712 Unilateral primary osteoarthritis, left knee: Secondary | ICD-10-CM | POA: Diagnosis not present

## 2018-12-17 DIAGNOSIS — M1712 Unilateral primary osteoarthritis, left knee: Secondary | ICD-10-CM | POA: Diagnosis not present

## 2018-12-24 DIAGNOSIS — M1712 Unilateral primary osteoarthritis, left knee: Secondary | ICD-10-CM | POA: Diagnosis not present

## 2019-03-13 DIAGNOSIS — H53483 Generalized contraction of visual field, bilateral: Secondary | ICD-10-CM | POA: Diagnosis not present

## 2019-03-13 DIAGNOSIS — H5340 Unspecified visual field defects: Secondary | ICD-10-CM | POA: Diagnosis not present

## 2019-03-13 DIAGNOSIS — H532 Diplopia: Secondary | ICD-10-CM | POA: Diagnosis not present

## 2019-03-13 DIAGNOSIS — H3122 Choroidal dystrophy (central areolar) (generalized) (peripapillary): Secondary | ICD-10-CM | POA: Diagnosis not present

## 2019-03-13 DIAGNOSIS — G35 Multiple sclerosis: Secondary | ICD-10-CM | POA: Diagnosis not present

## 2019-03-19 ENCOUNTER — Other Ambulatory Visit: Payer: Self-pay | Admitting: Urology

## 2019-03-19 MED ORDER — MIRABEGRON ER 50 MG PO TB24: 50.0000 mg | ORAL_TABLET | Freq: Every day | ORAL | 11 refills | Status: DC
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
## 2019-03-19 NOTE — Telephone Encounter (Signed)
Rx sent to pharmacy   

## 2019-03-20 DIAGNOSIS — E782 Mixed hyperlipidemia: Secondary | ICD-10-CM | POA: Diagnosis not present

## 2019-03-20 DIAGNOSIS — R636 Underweight: Secondary | ICD-10-CM | POA: Diagnosis not present

## 2019-03-22 DIAGNOSIS — G35 Multiple sclerosis: Secondary | ICD-10-CM | POA: Diagnosis not present

## 2019-03-22 DIAGNOSIS — H5340 Unspecified visual field defects: Secondary | ICD-10-CM | POA: Diagnosis not present

## 2019-03-25 DIAGNOSIS — E782 Mixed hyperlipidemia: Secondary | ICD-10-CM | POA: Diagnosis not present

## 2019-03-25 DIAGNOSIS — R636 Underweight: Secondary | ICD-10-CM | POA: Diagnosis not present

## 2019-03-25 DIAGNOSIS — M8580 Other specified disorders of bone density and structure, unspecified site: Secondary | ICD-10-CM | POA: Diagnosis not present

## 2019-03-25 DIAGNOSIS — F322 Major depressive disorder, single episode, severe without psychotic features: Secondary | ICD-10-CM | POA: Diagnosis not present

## 2019-03-25 DIAGNOSIS — F900 Attention-deficit hyperactivity disorder, predominantly inattentive type: Secondary | ICD-10-CM | POA: Diagnosis not present

## 2019-03-25 DIAGNOSIS — Z7189 Other specified counseling: Secondary | ICD-10-CM | POA: Diagnosis not present

## 2019-03-25 DIAGNOSIS — Z0000 Encounter for general adult medical examination without abnormal findings: Secondary | ICD-10-CM | POA: Diagnosis not present

## 2019-03-25 DIAGNOSIS — K580 Irritable bowel syndrome with diarrhea: Secondary | ICD-10-CM | POA: Diagnosis not present

## 2019-03-25 DIAGNOSIS — F411 Generalized anxiety disorder: Secondary | ICD-10-CM | POA: Diagnosis not present

## 2019-03-25 DIAGNOSIS — G35 Multiple sclerosis: Secondary | ICD-10-CM | POA: Diagnosis not present

## 2019-05-21 ENCOUNTER — Other Ambulatory Visit: Payer: Self-pay | Admitting: Internal Medicine

## 2019-05-30 DIAGNOSIS — G35 Multiple sclerosis: Secondary | ICD-10-CM | POA: Diagnosis not present

## 2019-05-30 DIAGNOSIS — Z79899 Other long term (current) drug therapy: Secondary | ICD-10-CM | POA: Diagnosis not present

## 2019-05-30 DIAGNOSIS — R5383 Other fatigue: Secondary | ICD-10-CM | POA: Diagnosis not present

## 2019-05-31 DIAGNOSIS — H2513 Age-related nuclear cataract, bilateral: Secondary | ICD-10-CM | POA: Diagnosis not present

## 2019-06-14 ENCOUNTER — Other Ambulatory Visit: Payer: Self-pay | Admitting: Internal Medicine
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
## 2019-06-28 ENCOUNTER — Ambulatory Visit: Payer: Medicare Other | Admitting: Urology

## 2019-07-02 NOTE — Progress Notes (Signed)
Error

## 2019-07-03 ENCOUNTER — Encounter: Payer: Self-pay | Admitting: Urology

## 2019-07-03 ENCOUNTER — Ambulatory Visit
Admission: RE | Admit: 2019-07-03 | Discharge: 2019-07-03 | Disposition: A | Discharge status: Home / Self Care | Payer: Medicare Other | Source: Ambulatory Visit | Attending: Urology | Admitting: Urology

## 2019-07-03 ENCOUNTER — Ambulatory Visit (INDEPENDENT_AMBULATORY_CARE_PROVIDER_SITE_OTHER): Payer: Medicare Other | Admitting: Urology

## 2019-07-03 ENCOUNTER — Other Ambulatory Visit: Payer: Self-pay

## 2019-07-03 DIAGNOSIS — Z87442 Personal history of urinary calculi: Secondary | ICD-10-CM | POA: Diagnosis not present

## 2019-07-03 DIAGNOSIS — N200 Calculus of kidney: Secondary | ICD-10-CM | POA: Diagnosis not present

## 2019-07-04 ENCOUNTER — Encounter: Payer: Self-pay | Admitting: Urology

## 2019-07-04 ENCOUNTER — Ambulatory Visit (INDEPENDENT_AMBULATORY_CARE_PROVIDER_SITE_OTHER): Payer: Medicare Other | Admitting: Urology

## 2019-07-04 VITALS — BP 98/67 | HR 64 | Ht 65.0 in | Wt 115.0 lb

## 2019-07-04 DIAGNOSIS — N3941 Urge incontinence: Secondary | ICD-10-CM | POA: Diagnosis not present

## 2019-07-04 DIAGNOSIS — Z87442 Personal history of urinary calculi: Secondary | ICD-10-CM
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
## 2019-08-12 ENCOUNTER — Ambulatory Visit (INDEPENDENT_AMBULATORY_CARE_PROVIDER_SITE_OTHER): Payer: Medicare Other | Admitting: Urology

## 2019-08-12 ENCOUNTER — Other Ambulatory Visit: Payer: Self-pay

## 2019-08-12 ENCOUNTER — Encounter: Payer: Self-pay | Admitting: Urology

## 2019-08-12 VITALS — BP 103/67 | HR 60 | Ht 65.0 in | Wt 121.0 lb

## 2019-08-12 DIAGNOSIS — N3946 Mixed incontinence: Secondary | ICD-10-CM | POA: Diagnosis not present
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
## 2019-08-15 ENCOUNTER — Other Ambulatory Visit: Payer: Self-pay | Admitting: Internal Medicine

## 2019-08-26 ENCOUNTER — Other Ambulatory Visit: Payer: Self-pay | Admitting: Internal Medicine

## 2019-09-09 ENCOUNTER — Other Ambulatory Visit: Payer: Self-pay | Admitting: Internal Medicine
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
## 2019-09-11 ENCOUNTER — Ambulatory Visit: Payer: BC Managed Care – PPO | Admitting: Physician Assistant
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
## 2019-09-20 ENCOUNTER — Encounter: Payer: Self-pay | Admitting: Student

## 2019-09-20 ENCOUNTER — Other Ambulatory Visit: Payer: Self-pay

## 2019-09-20 ENCOUNTER — Ambulatory Visit (INDEPENDENT_AMBULATORY_CARE_PROVIDER_SITE_OTHER): Payer: Medicare Other | Admitting: Student

## 2019-09-20 VITALS — BP 108/60 | HR 65 | Ht 67.0 in | Wt 118.8 lb

## 2019-09-20 DIAGNOSIS — R002 Palpitations: Secondary | ICD-10-CM

## 2019-09-20 LAB — BASIC METABOLIC PANEL
BUN/Creatinine Ratio: 25 (ref 12–28)
BUN: 23 mg/dL (ref 8–27)
CO2: 24 mmol/L (ref 20–29)
Calcium: 9.6 mg/dL (ref 8.7–10.3)
Chloride: 106 mmol/L (ref 96–106)
Creatinine, Ser: 0.91 mg/dL (ref 0.57–1.00)
GFR calc Af Amer: 76 mL/min/{1.73_m2} (ref >59)
GFR calc non Af Amer: 66 mL/min/{1.73_m2} (ref >59)
Glucose: 90 mg/dL (ref 65–99)
Potassium: 4.3 mmol/L (ref 3.5–5.2)
Sodium: 144 mmol/L (ref 134–144)

## 2019-09-20 LAB — CBC
Hematocrit: 36.3 % (ref 34.0–46.6)
Hemoglobin: 12.2 g/dL (ref 11.1–15.9)
MCH: 30.5 pg (ref 26.6–33.0)
MCHC: 33.6 g/dL (ref 31.5–35.7)
MCV: 91 fL (ref 79–97)
Platelets: 183 10*3/uL (ref 150–450)
RBC: 4 x10E6/uL (ref 3.77–5.28)
RDW: 12.8 % (ref 11.7–15.4)
WBC: 5 10*3/uL (ref 3.4–10.8)

## 2019-09-20 LAB — TSH: TSH: 3.11 u[IU]/mL (ref 0.450–4.500)

## 2019-09-20 NOTE — Patient Instructions (Signed)
Medication Instructions:  none *If you need a refill on your cardiac medications before your next appointment, please call your pharmacy*  Lab Work:TODAY CBC BMET TSH If you have labs (blood work) drawn today and your tests are completely normal, you will receive your results only by: Marland Kitchen MyChart Message (if you have MyChart) OR . A paper copy in the mail If you have any lab test that is abnormal or we need to change your treatment, we will call you to review the results.  Testing/Procedures: none  Follow-Up: At [facility], you and your health needs are our priority.  As part of our continuing mission to provide you with exceptional heart care, we have created designated Provider Care Teams.  These Care Teams include your primary Cardiologist (physician) and Advanced Practice Providers (APPs -  Physician Assistants and Nurse Practitioners) who all work together to provide you with the care you need, when you need it.  Your next appointment:   3-4 MONTHS  The format for your next appointment:   In Person  Provider:   Oda Kilts, PA  Other Instructions

## 2019-10-07 ENCOUNTER — Other Ambulatory Visit: Payer: Self-pay | Admitting: Internal Medicine

## 2019-10-16 DIAGNOSIS — R2681 Unsteadiness on feet: Secondary | ICD-10-CM | POA: Diagnosis not present

## 2019-10-16 DIAGNOSIS — M545 Low back pain: Secondary | ICD-10-CM | POA: Diagnosis not present

## 2019-10-16 DIAGNOSIS — R2689 Other abnormalities of gait and mobility: Secondary | ICD-10-CM | POA: Diagnosis not present

## 2019-10-18 DIAGNOSIS — R2681 Unsteadiness on feet: Secondary | ICD-10-CM | POA: Diagnosis not present

## 2019-10-18 DIAGNOSIS — M545 Low back pain: Secondary | ICD-10-CM | POA: Diagnosis not present

## 2019-10-18 DIAGNOSIS — R2689 Other abnormalities of gait and mobility: Secondary | ICD-10-CM | POA: Diagnosis not present

## 2019-10-21 DIAGNOSIS — R2689 Other abnormalities of gait and mobility: Secondary | ICD-10-CM | POA: Diagnosis not present

## 2019-10-21 DIAGNOSIS — R2681 Unsteadiness on feet: Secondary | ICD-10-CM | POA: Diagnosis not present

## 2019-10-21 DIAGNOSIS — M545 Low back pain: Secondary | ICD-10-CM | POA: Diagnosis not present

## 2019-10-23 DIAGNOSIS — R2681 Unsteadiness on feet: Secondary | ICD-10-CM | POA: Diagnosis not present

## 2019-10-23 DIAGNOSIS — M545 Low back pain: Secondary | ICD-10-CM | POA: Diagnosis not present

## 2019-10-23 DIAGNOSIS — R2689 Other abnormalities of gait and mobility: Secondary | ICD-10-CM | POA: Diagnosis not present

## 2019-10-25 ENCOUNTER — Other Ambulatory Visit: Payer: Self-pay | Admitting: Family Medicine

## 2019-10-25 DIAGNOSIS — Z1231 Encounter for screening mammogram for malignant neoplasm of breast: Secondary | ICD-10-CM

## 2019-10-29 DIAGNOSIS — R2689 Other abnormalities of gait and mobility: Secondary | ICD-10-CM | POA: Diagnosis not present

## 2019-10-29 DIAGNOSIS — M545 Low back pain: Secondary | ICD-10-CM | POA: Diagnosis not present

## 2019-10-29 DIAGNOSIS — R2681 Unsteadiness on feet: Secondary | ICD-10-CM | POA: Diagnosis not present

## 2019-11-01 DIAGNOSIS — M545 Low back pain: Secondary | ICD-10-CM | POA: Diagnosis not present

## 2019-11-01 DIAGNOSIS — R2681 Unsteadiness on feet: Secondary | ICD-10-CM | POA: Diagnosis not present

## 2019-11-01 DIAGNOSIS — R2689 Other abnormalities of gait and mobility: Secondary | ICD-10-CM | POA: Diagnosis not present

## 2019-11-05 DIAGNOSIS — R2681 Unsteadiness on feet: Secondary | ICD-10-CM | POA: Diagnosis not present

## 2019-11-05 DIAGNOSIS — R2689 Other abnormalities of gait and mobility: Secondary | ICD-10-CM | POA: Diagnosis not present

## 2019-11-05 DIAGNOSIS — M545 Low back pain: Secondary | ICD-10-CM | POA: Diagnosis not present

## 2019-11-08 DIAGNOSIS — R2689 Other abnormalities of gait and mobility: Secondary | ICD-10-CM | POA: Diagnosis not present

## 2019-11-08 DIAGNOSIS — M545 Low back pain: Secondary | ICD-10-CM | POA: Diagnosis not present

## 2019-11-08 DIAGNOSIS — R2681 Unsteadiness on feet: Secondary | ICD-10-CM | POA: Diagnosis not present

## 2019-11-11 DIAGNOSIS — M545 Low back pain: Secondary | ICD-10-CM | POA: Diagnosis not present

## 2019-11-11 DIAGNOSIS — R2689 Other abnormalities of gait and mobility: Secondary | ICD-10-CM | POA: Diagnosis not present

## 2019-11-11 DIAGNOSIS — R2681 Unsteadiness on feet: Secondary | ICD-10-CM | POA: Diagnosis not present

## 2019-11-18 DIAGNOSIS — R2681 Unsteadiness on feet: Secondary | ICD-10-CM | POA: Diagnosis not present

## 2019-11-18 DIAGNOSIS — R2689 Other abnormalities of gait and mobility: Secondary | ICD-10-CM | POA: Diagnosis not present

## 2019-11-18 DIAGNOSIS — M545 Low back pain: Secondary | ICD-10-CM | POA: Diagnosis not present

## 2019-11-21 DIAGNOSIS — M545 Low back pain: Secondary | ICD-10-CM | POA: Diagnosis not present

## 2019-11-21 DIAGNOSIS — R2681 Unsteadiness on feet: Secondary | ICD-10-CM | POA: Diagnosis not present

## 2019-11-21 DIAGNOSIS — R2689 Other abnormalities of gait and mobility: Secondary | ICD-10-CM | POA: Diagnosis not present

## 2019-11-22 DIAGNOSIS — G35 Multiple sclerosis: Secondary | ICD-10-CM | POA: Diagnosis not present

## 2019-11-22 DIAGNOSIS — H53483 Generalized contraction of visual field, bilateral: Secondary | ICD-10-CM | POA: Diagnosis not present

## 2019-11-22 DIAGNOSIS — H53462 Homonymous bilateral field defects, left side: Secondary | ICD-10-CM | POA: Diagnosis not present

## 2019-11-25 DIAGNOSIS — R2689 Other abnormalities of gait and mobility: Secondary | ICD-10-CM | POA: Diagnosis not present

## 2019-11-25 DIAGNOSIS — R2681 Unsteadiness on feet: Secondary | ICD-10-CM | POA: Diagnosis not present

## 2019-11-25 DIAGNOSIS — M545 Low back pain: Secondary | ICD-10-CM | POA: Diagnosis not present

## 2019-11-26 DIAGNOSIS — G35 Multiple sclerosis: Secondary | ICD-10-CM | POA: Diagnosis not present

## 2019-11-27 DIAGNOSIS — R2681 Unsteadiness on feet: Secondary | ICD-10-CM | POA: Diagnosis not present

## 2019-11-27 DIAGNOSIS — R2689 Other abnormalities of gait and mobility: Secondary | ICD-10-CM | POA: Diagnosis not present

## 2019-11-27 DIAGNOSIS — M545 Low back pain: Secondary | ICD-10-CM | POA: Diagnosis not present

## 2019-12-03 DIAGNOSIS — R2681 Unsteadiness on feet: Secondary | ICD-10-CM | POA: Diagnosis not present

## 2019-12-03 DIAGNOSIS — M545 Low back pain: Secondary | ICD-10-CM | POA: Diagnosis not present

## 2019-12-03 DIAGNOSIS — R2689 Other abnormalities of gait and mobility: Secondary | ICD-10-CM | POA: Diagnosis not present

## 2019-12-05 ENCOUNTER — Other Ambulatory Visit: Payer: Self-pay

## 2019-12-05 ENCOUNTER — Ambulatory Visit
Admission: RE | Admit: 2019-12-05 | Discharge: 2019-12-05 | Disposition: A | Discharge status: Home / Self Care | Payer: Medicare Other | Source: Ambulatory Visit | Attending: Family Medicine | Admitting: Family Medicine

## 2019-12-05 DIAGNOSIS — Z1231 Encounter for screening mammogram for malignant neoplasm of breast: Secondary | ICD-10-CM | POA: Diagnosis not present

## 2019-12-06 DIAGNOSIS — M545 Low back pain: Secondary | ICD-10-CM | POA: Diagnosis not present

## 2019-12-06 DIAGNOSIS — R2689 Other abnormalities of gait and mobility: Secondary | ICD-10-CM | POA: Diagnosis not present

## 2019-12-06 DIAGNOSIS — R2681 Unsteadiness on feet: Secondary | ICD-10-CM | POA: Diagnosis not present

## 2019-12-09 DIAGNOSIS — R2689 Other abnormalities of gait and mobility: Secondary | ICD-10-CM | POA: Diagnosis not present

## 2019-12-09 DIAGNOSIS — R2681 Unsteadiness on feet: Secondary | ICD-10-CM | POA: Diagnosis not present

## 2019-12-09 DIAGNOSIS — M545 Low back pain: Secondary | ICD-10-CM | POA: Diagnosis not present

## 2019-12-12 DIAGNOSIS — R2689 Other abnormalities of gait and mobility: Secondary | ICD-10-CM | POA: Diagnosis not present

## 2019-12-12 DIAGNOSIS — R2681 Unsteadiness on feet: Secondary | ICD-10-CM | POA: Diagnosis not present

## 2019-12-12 DIAGNOSIS — M545 Low back pain: Secondary | ICD-10-CM | POA: Diagnosis not present

## 2019-12-16 DIAGNOSIS — R2681 Unsteadiness on feet: Secondary | ICD-10-CM | POA: Diagnosis not present

## 2019-12-16 DIAGNOSIS — M545 Low back pain: Secondary | ICD-10-CM | POA: Diagnosis not present

## 2019-12-16 DIAGNOSIS — R2689 Other abnormalities of gait and mobility: Secondary | ICD-10-CM | POA: Diagnosis not present

## 2020-01-20 ENCOUNTER — Encounter: Payer: Self-pay | Admitting: Student

## 2020-01-20 ENCOUNTER — Other Ambulatory Visit: Payer: Self-pay

## 2020-01-20 ENCOUNTER — Ambulatory Visit (INDEPENDENT_AMBULATORY_CARE_PROVIDER_SITE_OTHER): Payer: Medicare Other | Admitting: Student

## 2020-01-20 VITALS — BP 102/60 | HR 68 | Ht 67.0 in | Wt 121.8 lb

## 2020-01-20 DIAGNOSIS — I10 Essential (primary) hypertension: Secondary | ICD-10-CM

## 2020-01-20 DIAGNOSIS — R002 Palpitations: Secondary | ICD-10-CM

## 2020-01-20 NOTE — Patient Instructions (Addendum)
Medication Instructions:  none *If you need a refill on your cardiac medications before your next appointment, please call your pharmacy*   Lab Work: none If you have labs (blood work) drawn today and your tests are completely normal, you will receive your results only by: Marland Kitchen MyChart Message (if you have MyChart) OR . A paper copy in the mail If you have any lab test that is abnormal or we need to change your treatment, we will call you to review the results.   Testing/Procedures: none   Follow-Up: At [facility], you and your health needs are our priority.  As part of our continuing mission to provide you with exceptional heart care, we have created designated Provider Care Teams.  These Care Teams include your primary Cardiologist (physician) and Advanced Practice Providers (APPs -  Physician Assistants and Nurse Practitioners) who all work together to provide you with the care you need, when you need it.     Your next appointment:   6 MONTHS  The format for your next appointment:   Either In Person or Virtual  Provider:   Dr Ladona Ridgel   Other Instructions
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
## 2020-01-27 DIAGNOSIS — H43812 Vitreous degeneration, left eye: Secondary | ICD-10-CM | POA: Diagnosis not present

## 2020-03-03 DIAGNOSIS — H2513 Age-related nuclear cataract, bilateral: Secondary | ICD-10-CM | POA: Diagnosis not present

## 2020-04-02 ENCOUNTER — Other Ambulatory Visit: Payer: Self-pay | Admitting: Urology

## 2020-05-04 DIAGNOSIS — M5416 Radiculopathy, lumbar region: Secondary | ICD-10-CM | POA: Diagnosis not present

## 2020-05-26 ENCOUNTER — Other Ambulatory Visit: Payer: Self-pay | Admitting: Neurosurgery

## 2020-05-26 DIAGNOSIS — M5416 Radiculopathy, lumbar region: Secondary | ICD-10-CM

## 2020-06-12 ENCOUNTER — Ambulatory Visit: Payer: Medicare Other

## 2020-06-30 ENCOUNTER — Ambulatory Visit
Admission: RE | Admit: 2020-06-30 | Discharge: 2020-06-30 | Disposition: A | Discharge status: Home / Self Care | Payer: Medicare Other | Source: Ambulatory Visit | Attending: Neurosurgery | Admitting: Neurosurgery

## 2020-06-30 ENCOUNTER — Other Ambulatory Visit: Payer: Self-pay

## 2020-06-30 DIAGNOSIS — M48061 Spinal stenosis, lumbar region without neurogenic claudication: Secondary | ICD-10-CM | POA: Diagnosis not present

## 2020-06-30 DIAGNOSIS — M5136 Other intervertebral disc degeneration, lumbar region: Secondary | ICD-10-CM | POA: Diagnosis not present

## 2020-06-30 DIAGNOSIS — M4807 Spinal stenosis, lumbosacral region: Secondary | ICD-10-CM | POA: Diagnosis not present

## 2020-06-30 DIAGNOSIS — M5416 Radiculopathy, lumbar region: Secondary | ICD-10-CM | POA: Diagnosis not present

## 2020-06-30 DIAGNOSIS — M5126 Other intervertebral disc displacement, lumbar region: Secondary | ICD-10-CM | POA: Diagnosis not present

## 2020-07-06 ENCOUNTER — Other Ambulatory Visit: Payer: Self-pay | Admitting: Internal Medicine

## 2020-07-07 DIAGNOSIS — Z0000 Encounter for general adult medical examination without abnormal findings: Secondary | ICD-10-CM | POA: Diagnosis not present

## 2020-07-07 DIAGNOSIS — M8580 Other specified disorders of bone density and structure, unspecified site: Secondary | ICD-10-CM | POA: Diagnosis not present

## 2020-07-07 DIAGNOSIS — R636 Underweight: Secondary | ICD-10-CM | POA: Diagnosis not present

## 2020-07-07 DIAGNOSIS — Z23 Encounter for immunization: Secondary | ICD-10-CM | POA: Diagnosis not present

## 2020-07-07 DIAGNOSIS — E782 Mixed hyperlipidemia: Secondary | ICD-10-CM | POA: Diagnosis not present

## 2020-07-07 DIAGNOSIS — F900 Attention-deficit hyperactivity disorder, predominantly inattentive type: Secondary | ICD-10-CM | POA: Diagnosis not present

## 2020-07-07 DIAGNOSIS — K580 Irritable bowel syndrome with diarrhea: Secondary | ICD-10-CM | POA: Diagnosis not present

## 2020-07-07 DIAGNOSIS — G35 Multiple sclerosis: Secondary | ICD-10-CM | POA: Diagnosis not present

## 2020-07-07 DIAGNOSIS — F411 Generalized anxiety disorder: Secondary | ICD-10-CM | POA: Diagnosis not present

## 2020-07-07 DIAGNOSIS — M9983 Other biomechanical lesions of lumbar region: Secondary | ICD-10-CM | POA: Diagnosis not present

## 2020-07-07 DIAGNOSIS — F322 Major depressive disorder, single episode, severe without psychotic features: Secondary | ICD-10-CM | POA: Diagnosis not present

## 2020-07-08 ENCOUNTER — Other Ambulatory Visit: Payer: Self-pay | Admitting: Family Medicine

## 2020-07-08 DIAGNOSIS — M8580 Other specified disorders of bone density and structure, unspecified site: Secondary | ICD-10-CM

## 2020-07-10 DIAGNOSIS — M5416 Radiculopathy, lumbar region: Secondary | ICD-10-CM | POA: Diagnosis not present

## 2020-07-29 DIAGNOSIS — M5416 Radiculopathy, lumbar region: Secondary | ICD-10-CM | POA: Diagnosis not present

## 2020-08-19 DIAGNOSIS — M5416 Radiculopathy, lumbar region: Secondary | ICD-10-CM | POA: Diagnosis not present

## 2020-09-09 DIAGNOSIS — M5410 Radiculopathy, site unspecified: Secondary | ICD-10-CM | POA: Diagnosis not present

## 2020-09-09 DIAGNOSIS — Z888 Allergy status to other drugs, medicaments and biological substances status: Secondary | ICD-10-CM | POA: Diagnosis not present

## 2020-09-09 DIAGNOSIS — R5383 Other fatigue: Secondary | ICD-10-CM | POA: Diagnosis not present

## 2020-09-09 DIAGNOSIS — R42 Dizziness and giddiness: Secondary | ICD-10-CM | POA: Diagnosis not present

## 2020-09-09 DIAGNOSIS — Z79899 Other long term (current) drug therapy: Secondary | ICD-10-CM | POA: Diagnosis not present

## 2020-09-09 DIAGNOSIS — M545 Low back pain, unspecified: Secondary | ICD-10-CM | POA: Diagnosis not present

## 2020-09-09 DIAGNOSIS — G35 Multiple sclerosis: Secondary | ICD-10-CM | POA: Diagnosis not present

## 2020-09-09 DIAGNOSIS — Z886 Allergy status to analgesic agent status: Secondary | ICD-10-CM | POA: Diagnosis not present

## 2020-09-09 DIAGNOSIS — H532 Diplopia: Secondary | ICD-10-CM | POA: Diagnosis not present

## 2020-09-09 DIAGNOSIS — M549 Dorsalgia, unspecified: Secondary | ICD-10-CM | POA: Diagnosis not present

## 2020-09-09 DIAGNOSIS — R4189 Other symptoms and signs involving cognitive functions and awareness: Secondary | ICD-10-CM | POA: Diagnosis not present

## 2020-09-09 DIAGNOSIS — Z885 Allergy status to narcotic agent status: Secondary | ICD-10-CM | POA: Diagnosis not present

## 2020-09-09 DIAGNOSIS — Z881 Allergy status to other antibiotic agents status: Secondary | ICD-10-CM | POA: Diagnosis not present

## 2020-09-29 DIAGNOSIS — M4722 Other spondylosis with radiculopathy, cervical region: Secondary | ICD-10-CM | POA: Diagnosis not present

## 2020-09-29 DIAGNOSIS — M4802 Spinal stenosis, cervical region: Secondary | ICD-10-CM | POA: Diagnosis not present

## 2020-09-29 DIAGNOSIS — M2578 Osteophyte, vertebrae: Secondary | ICD-10-CM | POA: Diagnosis not present

## 2020-09-29 DIAGNOSIS — G35 Multiple sclerosis: Secondary | ICD-10-CM | POA: Diagnosis not present

## 2020-09-29 DIAGNOSIS — M5021 Other cervical disc displacement,  high cervical region: Secondary | ICD-10-CM | POA: Diagnosis not present

## 2020-09-30 DIAGNOSIS — H8110 Benign paroxysmal vertigo, unspecified ear: Secondary | ICD-10-CM | POA: Diagnosis not present

## 2020-09-30 DIAGNOSIS — R2681 Unsteadiness on feet: Secondary | ICD-10-CM | POA: Diagnosis not present

## 2020-09-30 DIAGNOSIS — M6281 Muscle weakness (generalized): Secondary | ICD-10-CM | POA: Diagnosis not present

## 2020-09-30 DIAGNOSIS — R2689 Other abnormalities of gait and mobility: Secondary | ICD-10-CM | POA: Diagnosis not present

## 2020-10-06 DIAGNOSIS — M5416 Radiculopathy, lumbar region: Secondary | ICD-10-CM | POA: Diagnosis not present

## 2020-10-08 DIAGNOSIS — R2689 Other abnormalities of gait and mobility: Secondary | ICD-10-CM | POA: Diagnosis not present

## 2020-10-08 DIAGNOSIS — H8110 Benign paroxysmal vertigo, unspecified ear: Secondary | ICD-10-CM | POA: Diagnosis not present

## 2020-10-08 DIAGNOSIS — R2681 Unsteadiness on feet: Secondary | ICD-10-CM | POA: Diagnosis not present

## 2020-10-08 DIAGNOSIS — M6281 Muscle weakness (generalized): Secondary | ICD-10-CM | POA: Diagnosis not present

## 2020-10-14 DIAGNOSIS — R2681 Unsteadiness on feet: Secondary | ICD-10-CM | POA: Diagnosis not present

## 2020-10-14 DIAGNOSIS — R2689 Other abnormalities of gait and mobility: Secondary | ICD-10-CM | POA: Diagnosis not present

## 2020-10-14 DIAGNOSIS — M6281 Muscle weakness (generalized): Secondary | ICD-10-CM | POA: Diagnosis not present

## 2020-10-14 DIAGNOSIS — H8110 Benign paroxysmal vertigo, unspecified ear: Secondary | ICD-10-CM | POA: Diagnosis not present

## 2020-10-19 DIAGNOSIS — M6281 Muscle weakness (generalized): Secondary | ICD-10-CM | POA: Diagnosis not present

## 2020-10-19 DIAGNOSIS — H8110 Benign paroxysmal vertigo, unspecified ear: Secondary | ICD-10-CM | POA: Diagnosis not present

## 2020-10-19 DIAGNOSIS — R2681 Unsteadiness on feet: Secondary | ICD-10-CM | POA: Diagnosis not present

## 2020-10-19 DIAGNOSIS — R2689 Other abnormalities of gait and mobility: Secondary | ICD-10-CM | POA: Diagnosis not present

## 2020-10-21 DIAGNOSIS — H8110 Benign paroxysmal vertigo, unspecified ear: Secondary | ICD-10-CM | POA: Diagnosis not present

## 2020-10-21 DIAGNOSIS — R2681 Unsteadiness on feet: Secondary | ICD-10-CM | POA: Diagnosis not present

## 2020-10-21 DIAGNOSIS — R2689 Other abnormalities of gait and mobility: Secondary | ICD-10-CM | POA: Diagnosis not present

## 2020-10-21 DIAGNOSIS — M6281 Muscle weakness (generalized): Secondary | ICD-10-CM | POA: Diagnosis not present

## 2020-10-23 DIAGNOSIS — R2681 Unsteadiness on feet: Secondary | ICD-10-CM | POA: Diagnosis not present

## 2020-10-23 DIAGNOSIS — R2689 Other abnormalities of gait and mobility: Secondary | ICD-10-CM | POA: Diagnosis not present

## 2020-10-23 DIAGNOSIS — H8110 Benign paroxysmal vertigo, unspecified ear: Secondary | ICD-10-CM | POA: Diagnosis not present

## 2020-10-23 DIAGNOSIS — M6281 Muscle weakness (generalized): Secondary | ICD-10-CM | POA: Diagnosis not present

## 2020-10-28 DIAGNOSIS — R0981 Nasal congestion: Secondary | ICD-10-CM | POA: Diagnosis not present

## 2020-10-28 DIAGNOSIS — M6281 Muscle weakness (generalized): Secondary | ICD-10-CM | POA: Diagnosis not present

## 2020-10-28 DIAGNOSIS — R2681 Unsteadiness on feet: Secondary | ICD-10-CM | POA: Diagnosis not present

## 2020-10-28 DIAGNOSIS — H8110 Benign paroxysmal vertigo, unspecified ear: Secondary | ICD-10-CM | POA: Diagnosis not present

## 2020-10-28 DIAGNOSIS — R2689 Other abnormalities of gait and mobility: Secondary | ICD-10-CM | POA: Diagnosis not present

## 2020-11-02 ENCOUNTER — Encounter: Payer: Self-pay | Admitting: Internal Medicine

## 2020-11-02 ENCOUNTER — Other Ambulatory Visit: Payer: Self-pay

## 2020-11-02 ENCOUNTER — Ambulatory Visit (INDEPENDENT_AMBULATORY_CARE_PROVIDER_SITE_OTHER): Payer: Medicare Other | Admitting: Internal Medicine

## 2020-11-02 VITALS — BP 110/76 | HR 55 | Ht 67.0 in | Wt 114.8 lb

## 2020-11-02 DIAGNOSIS — R2689 Other abnormalities of gait and mobility: Secondary | ICD-10-CM | POA: Diagnosis not present

## 2020-11-02 DIAGNOSIS — R002 Palpitations: Secondary | ICD-10-CM

## 2020-11-02 DIAGNOSIS — R2681 Unsteadiness on feet: Secondary | ICD-10-CM | POA: Diagnosis not present

## 2020-11-02 DIAGNOSIS — M6281 Muscle weakness (generalized): Secondary | ICD-10-CM | POA: Diagnosis not present

## 2020-11-02 DIAGNOSIS — H8110 Benign paroxysmal vertigo, unspecified ear: Secondary | ICD-10-CM | POA: Diagnosis not present

## 2020-11-02 MED ORDER — PROPRANOLOL HCL ER 60 MG PO CP24: 60.0000 mg | ORAL_CAPSULE | Freq: Every day | ORAL | 3 refills | Status: DC

## 2020-11-02 MED ORDER — FLECAINIDE ACETATE 50 MG PO TABS: 50.0000 mg | ORAL_TABLET | Freq: Two times a day (BID) | ORAL | 11 refills | Status: DC

## 2020-11-02 NOTE — Patient Instructions (Addendum)
Medication Instructions:  Your physician has recommended you make the following change in your medication:  1.  STOP propranolol 80 mg  2.  START propranolol ER 60 mg- one tablet by mouth daily  3.  START taking flecainide 50 mg- Take one tablet by mouth twice a day  Labwork: None ordered.  Testing/Procedures: None ordered.  Follow-Up:  You will follow up in 2 weeks for an EKG - nurse visit for new start flecainide.  You will follow up with Dr. Ladona Ridgel in 3 months.   Any Other Special Instructions Will Be Listed Below (If Applicable).  If you need a refill on your cardiac medications before your next appointment, please call your pharmacy.   Flecainide tablets What is this medicine? FLECAINIDE (FLEK a nide) is an antiarrhythmic drug. This medicine is used to prevent irregular heart rhythm. It can also slow down fast heartbeats called tachycardia. This medicine may be used for other purposes; ask your health care provider or pharmacist if you have questions. COMMON BRAND NAME(S): Tambocor What should I tell my health care provider before I take this medicine? They need to know if you have any of these conditions:  abnormal levels of potassium in the blood  heart disease including heart rhythm and heart rate problems  kidney or liver disease  recent heart attack  an unusual or allergic reaction to flecainide, local anesthetics, other medicines, foods, dyes, or preservatives  pregnant or trying to get pregnant  breast-feeding How should I use this medicine? Take this medicine by mouth with a glass of water. Follow the directions on the prescription label. You can take this medicine with or without food. Take your doses at regular intervals. Do not take your medicine more often than directed. Do not stop taking this medicine suddenly. This may cause serious, heart-related side effects. If your doctor wants you to stop the medicine, the dose may be slowly lowered over time  to avoid any side effects. Talk to your pediatrician regarding the use of this medicine in children. While this drug may be prescribed for children as young as 1 year of age for selected conditions, precautions do apply. Overdosage: If you think you have taken too much of this medicine contact a poison control center or emergency room at once. NOTE: This medicine is only for you. Do not share this medicine with others. What if I miss a dose? If you miss a dose, take it as soon as you can. If it is almost time for your next dose, take only that dose. Do not take double or extra doses. What may interact with this medicine? Do not take this medicine with any of the following medications:  amoxapine  arsenic trioxide  certain antibiotics like clarithromycin, erythromycin, gatifloxacin, gemifloxacin, levofloxacin, moxifloxacin, sparfloxacin, or troleandomycin  certain antidepressants called tricyclic antidepressants like amitriptyline, imipramine, or nortriptyline  certain medicines to control heart rhythm like disopyramide, encainide, moricizine, procainamide, propafenone, and quinidine  cisapride  delavirdine  droperidol  haloperidol  hawthorn  imatinib  levomethadyl  maprotiline  medicines for malaria like chloroquine and halofantrine  pentamidine  phenothiazines like chlorpromazine, mesoridazine, prochlorperazine, thioridazine  pimozide  quinine  ranolazine  ritonavir  sertindole This medicine may also interact with the following medications:  cimetidine  dofetilide  medicines for angina or high blood pressure  medicines to control heart rhythm like amiodarone and digoxin  ziprasidone This list may not describe all possible interactions. Give your health care provider a list of all the medicines,  herbs, non-prescription drugs, or dietary supplements you use. Also tell them if you smoke, drink alcohol, or use illegal drugs. Some items may interact with your  medicine. What should I watch for while using this medicine? Visit your doctor or health care professional for regular checks on your progress. Because your condition and the use of this medicine carries some risk, it is a good idea to carry an identification card, necklace or bracelet with details of your condition, medications and doctor or health care professional. Check your blood pressure and pulse rate regularly. Ask your health care professional what your blood pressure and pulse rate should be, and when you should contact him or her. Your doctor or health care professional also may schedule regular blood tests and electrocardiograms to check your progress. You may get drowsy or dizzy. Do not drive, use machinery, or do anything that needs mental alertness until you know how this medicine affects you. Do not stand or sit up quickly, especially if you are an older patient. This reduces the risk of dizzy or fainting spells. Alcohol can make you more dizzy, increase flushing and rapid heartbeats. Avoid alcoholic drinks. What side effects may I notice from receiving this medicine? Side effects that you should report to your doctor or health care professional as soon as possible:  chest pain, continued irregular heartbeats  difficulty breathing  swelling of the legs or feet  trembling, shaking  unusually weak or tired Side effects that usually do not require medical attention (report to your doctor or health care professional if they continue or are bothersome):  blurred vision  constipation  headache  nausea, vomiting  stomach pain This list may not describe all possible side effects. Call your doctor for medical advice about side effects. You may report side effects to FDA at 1-800-FDA-1088. Where should I keep my medicine? Keep out of the reach of children. Store at room temperature between 15 and 30 degrees C (59 and 86 degrees F). Protect from light. Keep container tightly  closed. Throw away any unused medicine after the expiration date. NOTE: This sheet is a summary. It may not cover all possible information. If you have questions about this medicine, talk to your doctor, pharmacist, or health care provider.  2021 Elsevier/Gold Standard (2018-09-17 11:41:38)
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
## 2020-11-04 ENCOUNTER — Telehealth: Payer: Self-pay | Admitting: Internal Medicine

## 2020-11-04 DIAGNOSIS — M6281 Muscle weakness (generalized): Secondary | ICD-10-CM | POA: Diagnosis not present

## 2020-11-04 DIAGNOSIS — H8110 Benign paroxysmal vertigo, unspecified ear: Secondary | ICD-10-CM | POA: Diagnosis not present

## 2020-11-04 DIAGNOSIS — R2689 Other abnormalities of gait and mobility: Secondary | ICD-10-CM | POA: Diagnosis not present

## 2020-11-04 DIAGNOSIS — R2681 Unsteadiness on feet: Secondary | ICD-10-CM | POA: Diagnosis not present

## 2020-11-04 NOTE — Telephone Encounter (Signed)
Pt c/o medication issue:  1. Name of Medication:   flecainide (TAMBOCOR) 50 MG tablet     2. How are you currently taking this medication (dosage and times per day)? As prescribed  3. Are you having a reaction (difficulty breathing--STAT)? No   4. What is your medication issue? Patient states she went to her local [facility] pharmacy for a refill and she was blocked from picking it up because she is also taking PARoxetine (PAXIL-CR) 25 MG 24 hr tablet, prescribed by her PCP. Patient states the pharmacist at [facility] informed her that it may cause a medication interaction. She would like to know if there is another medication she can take in place of the flecainide. Please advise.

## 2020-11-04 NOTE — Telephone Encounter (Signed)
Returned call to Pt.  Advised ok to start flecainide while taking Paxil.  Explained we started her on a very low dose flecainide and are having her come back in for a nurse visit to prevent any harm.  Pt thanked nurse for call.

## 2020-11-05 ENCOUNTER — Other Ambulatory Visit: Payer: BC Managed Care – PPO

## 2020-11-06 DIAGNOSIS — H8110 Benign paroxysmal vertigo, unspecified ear: Secondary | ICD-10-CM | POA: Diagnosis not present

## 2020-11-06 DIAGNOSIS — M6281 Muscle weakness (generalized): Secondary | ICD-10-CM | POA: Diagnosis not present

## 2020-11-06 DIAGNOSIS — R2681 Unsteadiness on feet: Secondary | ICD-10-CM | POA: Diagnosis not present

## 2020-11-06 DIAGNOSIS — R2689 Other abnormalities of gait and mobility: Secondary | ICD-10-CM | POA: Diagnosis not present

## 2020-11-09 DIAGNOSIS — H8110 Benign paroxysmal vertigo, unspecified ear: Secondary | ICD-10-CM | POA: Diagnosis not present

## 2020-11-09 DIAGNOSIS — M6281 Muscle weakness (generalized): Secondary | ICD-10-CM | POA: Diagnosis not present

## 2020-11-09 DIAGNOSIS — R2681 Unsteadiness on feet: Secondary | ICD-10-CM | POA: Diagnosis not present

## 2020-11-09 DIAGNOSIS — R2689 Other abnormalities of gait and mobility: Secondary | ICD-10-CM | POA: Diagnosis not present

## 2020-11-13 ENCOUNTER — Other Ambulatory Visit: Payer: Self-pay

## 2020-11-13 ENCOUNTER — Other Ambulatory Visit: Payer: Medicare Other

## 2020-11-13 ENCOUNTER — Ambulatory Visit
Admission: RE | Admit: 2020-11-13 | Discharge: 2020-11-13 | Disposition: A | Discharge status: Home / Self Care | Payer: Medicare Other | Source: Ambulatory Visit | Attending: Family Medicine | Admitting: Family Medicine

## 2020-11-13 DIAGNOSIS — M85851 Other specified disorders of bone density and structure, right thigh: Secondary | ICD-10-CM | POA: Diagnosis not present

## 2020-11-13 DIAGNOSIS — M8580 Other specified disorders of bone density and structure, unspecified site: Secondary | ICD-10-CM

## 2020-11-13 DIAGNOSIS — Z780 Asymptomatic menopausal state: Secondary | ICD-10-CM | POA: Diagnosis not present

## 2020-11-13 DIAGNOSIS — M810 Age-related osteoporosis without current pathological fracture: Secondary | ICD-10-CM | POA: Diagnosis not present

## 2020-11-16 ENCOUNTER — Other Ambulatory Visit: Payer: Self-pay

## 2020-11-16 ENCOUNTER — Ambulatory Visit (INDEPENDENT_AMBULATORY_CARE_PROVIDER_SITE_OTHER): Payer: Medicare Other

## 2020-11-16 VITALS — HR 68 | Ht 67.0 in | Wt 114.0 lb

## 2020-11-16 DIAGNOSIS — R002 Palpitations: Secondary | ICD-10-CM

## 2020-11-16 NOTE — Progress Notes (Signed)
Reason for visit:  Repeat EKG  for flecainide start 2 weeks ago. Name of MD requesting visit: Dr. Ladona Ridgel  1.) Assessment and plan per MD: Pt stated that she has not had any issues since starting Flecainide 2 weeks ago. EKG reviewed by Dr. Ladona Ridgel, no new changes ordered.

## 2020-11-18 DIAGNOSIS — H8110 Benign paroxysmal vertigo, unspecified ear: Secondary | ICD-10-CM | POA: Diagnosis not present

## 2020-11-18 DIAGNOSIS — R2681 Unsteadiness on feet: Secondary | ICD-10-CM | POA: Diagnosis not present

## 2020-11-18 DIAGNOSIS — R2689 Other abnormalities of gait and mobility: Secondary | ICD-10-CM | POA: Diagnosis not present

## 2020-11-18 DIAGNOSIS — M6281 Muscle weakness (generalized): Secondary | ICD-10-CM | POA: Diagnosis not present

## 2020-11-18 NOTE — Addendum Note (Signed)
Addended by: Theresia Majors on: 11/18/2020 10:48 AM   Modules accepted: Orders

## 2020-11-20 DIAGNOSIS — R2689 Other abnormalities of gait and mobility: Secondary | ICD-10-CM | POA: Diagnosis not present

## 2020-11-20 DIAGNOSIS — H8110 Benign paroxysmal vertigo, unspecified ear: Secondary | ICD-10-CM | POA: Diagnosis not present

## 2020-11-20 DIAGNOSIS — R2681 Unsteadiness on feet: Secondary | ICD-10-CM | POA: Diagnosis not present

## 2020-11-20 DIAGNOSIS — M6281 Muscle weakness (generalized): Secondary | ICD-10-CM | POA: Diagnosis not present

## 2020-11-23 DIAGNOSIS — M6281 Muscle weakness (generalized): Secondary | ICD-10-CM | POA: Diagnosis not present

## 2020-11-23 DIAGNOSIS — R2689 Other abnormalities of gait and mobility: Secondary | ICD-10-CM | POA: Diagnosis not present

## 2020-11-23 DIAGNOSIS — R2681 Unsteadiness on feet: Secondary | ICD-10-CM | POA: Diagnosis not present

## 2020-11-23 DIAGNOSIS — H8110 Benign paroxysmal vertigo, unspecified ear: Secondary | ICD-10-CM | POA: Diagnosis not present

## 2020-11-27 DIAGNOSIS — M5416 Radiculopathy, lumbar region: Secondary | ICD-10-CM | POA: Diagnosis not present

## 2020-12-01 ENCOUNTER — Telehealth: Payer: Self-pay | Admitting: *Deleted

## 2020-12-01 NOTE — Telephone Encounter (Signed)
   Primary Cardiologist:   Chart reviewed as part of pre-operative protocol coverage. Patient was contacted 12/01/2020 in reference to pre-operative risk assessment for pending surgery as outlined below.  [name] was last seen on 11/02/20 by Dr. Ladona Ridgel. She has a hx of PAC/PVC and hypertension. At last clinic visit she noted increased palpitations and she was started on Flecainide. She had nurse visit 11/16/20 with EKG showing no acute findings.  Since that day, [name] has done well. Reports exercise tolerance of >4 METS and that her palpitations have improved since the addition of Flecainide.  Therefore, based on ACC/AHA guidelines, the patient would be at acceptable risk for the planned procedure without further cardiovascular testing.   The patient was advised that if she develops new symptoms prior to surgery to contact our office to arrange for a follow-up visit, and she verbalized understanding.  I will route this recommendation to the requesting party via Epic fax function and remove from pre-op pool. Please call with questions.  Alver Sorrow, NP 12/01/2020, 2:14 PM

## 2020-12-01 NOTE — Telephone Encounter (Signed)
   [facility] Pre-operative Risk Assessment    HEARTCARE STAFF: - Please ensure there is not already an duplicate clearance open for this procedure. - Under Visit Info/Reason for Call, type in Other and utilize the format Clearance MM/DD/YY or Clearance TBD. Do not use dashes or single digits. - If request is for dental extraction, please clarify the # of teeth to be extracted.  Request for surgical clearance:  1. What type of surgery is being performed? MICRODISCECTOMY   2. When is this surgery scheduled? TBD   3. What type of clearance is required (medical clearance vs. Pharmacy clearance to hold med vs. Both)? MEDICAL  4. Are there any medications that need to be held prior to surgery and how long? NONE LISTED    5. Practice name and name of physician performing surgery? [facility]: DR. Duffy Rhody   6. What is the office phone number? [phone]   7.   What is the office fax number? [place]: [email or web address]  8.   Anesthesia type (None, local, MAC, general) ? GENERAL    Julaine Hua 12/01/2020, 1:53 PM  _________________________________________________________________   (provider comments below)

## 2020-12-08 DIAGNOSIS — R2681 Unsteadiness on feet: Secondary | ICD-10-CM | POA: Diagnosis not present

## 2020-12-08 DIAGNOSIS — H8110 Benign paroxysmal vertigo, unspecified ear: Secondary | ICD-10-CM | POA: Diagnosis not present

## 2020-12-08 DIAGNOSIS — R2689 Other abnormalities of gait and mobility: Secondary | ICD-10-CM | POA: Diagnosis not present

## 2020-12-08 DIAGNOSIS — M6281 Muscle weakness (generalized): Secondary | ICD-10-CM | POA: Diagnosis not present

## 2020-12-15 DIAGNOSIS — M6281 Muscle weakness (generalized): Secondary | ICD-10-CM | POA: Diagnosis not present

## 2020-12-15 DIAGNOSIS — R2681 Unsteadiness on feet: Secondary | ICD-10-CM | POA: Diagnosis not present

## 2020-12-15 DIAGNOSIS — H8110 Benign paroxysmal vertigo, unspecified ear: Secondary | ICD-10-CM | POA: Diagnosis not present

## 2020-12-15 DIAGNOSIS — R2689 Other abnormalities of gait and mobility: Secondary | ICD-10-CM | POA: Diagnosis not present

## 2020-12-17 DIAGNOSIS — M6281 Muscle weakness (generalized): Secondary | ICD-10-CM | POA: Diagnosis not present

## 2020-12-17 DIAGNOSIS — H8110 Benign paroxysmal vertigo, unspecified ear: Secondary | ICD-10-CM | POA: Diagnosis not present

## 2020-12-17 DIAGNOSIS — R2689 Other abnormalities of gait and mobility: Secondary | ICD-10-CM | POA: Diagnosis not present

## 2020-12-17 DIAGNOSIS — R2681 Unsteadiness on feet: Secondary | ICD-10-CM | POA: Diagnosis not present

## 2020-12-21 DIAGNOSIS — R2681 Unsteadiness on feet: Secondary | ICD-10-CM | POA: Diagnosis not present

## 2020-12-21 DIAGNOSIS — H8110 Benign paroxysmal vertigo, unspecified ear: Secondary | ICD-10-CM | POA: Diagnosis not present

## 2020-12-21 DIAGNOSIS — R2689 Other abnormalities of gait and mobility: Secondary | ICD-10-CM | POA: Diagnosis not present

## 2020-12-21 DIAGNOSIS — M6281 Muscle weakness (generalized): Secondary | ICD-10-CM | POA: Diagnosis not present

## 2020-12-30 ENCOUNTER — Encounter: Payer: Self-pay | Admitting: Internal Medicine

## 2020-12-30 ENCOUNTER — Other Ambulatory Visit: Payer: Self-pay

## 2020-12-30 ENCOUNTER — Ambulatory Visit (INDEPENDENT_AMBULATORY_CARE_PROVIDER_SITE_OTHER): Payer: Medicare Other | Admitting: Internal Medicine

## 2020-12-30 VITALS — BP 117/73 | HR 63 | Ht 65.0 in | Wt 113.6 lb

## 2020-12-30 DIAGNOSIS — G35 Multiple sclerosis: Secondary | ICD-10-CM | POA: Diagnosis not present

## 2020-12-30 DIAGNOSIS — E559 Vitamin D deficiency, unspecified: Secondary | ICD-10-CM | POA: Insufficient documentation

## 2020-12-30 DIAGNOSIS — R296 Repeated falls: Secondary | ICD-10-CM | POA: Diagnosis not present

## 2020-12-30 DIAGNOSIS — M810 Age-related osteoporosis without current pathological fracture: Secondary | ICD-10-CM | POA: Insufficient documentation

## 2020-12-30 LAB — COMPLETE METABOLIC PANEL WITH GFR
AG Ratio: 2 (calc) (ref 1.0–2.5)
CO2: 31 mmol/L (ref 20–32)
GFR, Est Non African American: 67 mL/min/{1.73_m2} (ref >=60)
Globulin: 2.1 g/dL (calc) (ref 1.9–3.7)
Sodium: 143 mmol/L (ref 135–146)

## 2020-12-30 NOTE — Patient Instructions (Addendum)
Osteoporosis  Osteoporosis happens when the bones become thin and less dense than normal. Osteoporosis makes bones more brittle and fragile and more likely to break (fracture). Over time, osteoporosis can cause your bones to become so weak that they fracture after a minor fall. Bones in the hip, wrist, and spine are most likely to fracture due to osteoporosis. What are the causes? The exact cause of this condition is not known. What increases the risk? You are more likely to develop this condition if you:  Have family members with this condition.  Have poor nutrition.  Use the following: ? Steroid medicines, such as prednisone. ? Anti-seizure medicines. ? Nicotine or tobacco, such as cigarettes, e-cigarettes, and chewing tobacco.  Are female.  Are age 7 or older.  Are not physically active (are sedentary).  Are of European or Asian descent.  Have a small body frame. What are the signs or symptoms? A fracture might be the first sign of osteoporosis, especially if the fracture results from a fall or injury that usually would not cause a bone to break. Other signs and symptoms include:  Pain in the neck or low back.  Stooped posture.  Loss of height. How is this diagnosed? This condition may be diagnosed based on:  Your medical history.  A physical exam.  A bone mineral density test, also called a DXA or DEXA test (dual-energy X-ray absorptiometry test). This test uses X-rays to measure the amount of minerals in your bones. How is this treated? This condition may be treated by:  Making lifestyle changes, such as: ? Including foods with more calcium and vitamin D in your diet. ? Doing weight-bearing and muscle-strengthening exercises. ? Stopping tobacco use. ? Limiting alcohol intake.  Taking medicine to slow the process of bone loss or to increase bone density.  Taking daily supplements of calcium and vitamin D.  Taking hormone replacement medicines, such as  estrogen for women and testosterone for men.  Monitoring your levels of calcium and vitamin D. The goal of treatment is to strengthen your bones and lower your risk for a fracture. Follow these instructions at home: Eating and drinking Include calcium and vitamin D in your diet. Calcium is important for bone health, and vitamin D helps your body absorb calcium. Good sources of calcium and vitamin D include:  Certain fatty fish, such as salmon and tuna.  Products that have calcium and vitamin D added to them (are fortified), such as fortified cereals.  Egg yolks.  Cheese.  Liver.   Activity Do exercises as told by your health care provider. Ask your health care provider what exercises and activities are safe for you. You should do:  Exercises that make you work against gravity (weight-bearing exercises), such as tai chi, yoga, or walking.  Exercises to strengthen muscles, such as lifting weights. Lifestyle  Do not drink alcohol if: ? Your health care provider tells you not to drink. ? You are pregnant, may be pregnant, or are planning to become pregnant.  If you drink alcohol: ? Limit how much you use to:  0-1 drink a day for women.  0-2 drinks a day for men.  Know how much alcohol is in your drink. In the [place] one drink equals one 12 oz bottle of beer (355 mL), one 5 oz glass of wine (148 mL), or one 1 oz glass of hard liquor (44 mL).  Do not use any products that contain nicotine or tobacco, such as cigarettes, e-cigarettes, and chewing  tobacco. If you need help quitting, ask your health care provider. Preventing falls  Use devices to help you move around (mobility aids) as needed, such as canes, walkers, scooters, or crutches.  Keep rooms well-lit and clutter-free.  Remove tripping hazards from walkways, including cords and throw rugs.  Install grab bars in bathrooms and safety rails on stairs.  Use rubber mats in the bathroom and other areas that are often wet or  slippery.  Wear closed-toe shoes that fit well and support your feet. Wear shoes that have rubber soles or low heels.  Review your medicines with your health care provider. Some medicines can cause dizziness or changes in blood pressure, which can increase your risk of falling. General instructions  Take over-the-counter and prescription medicines only as told by your health care provider.  Keep all follow-up visits. This is important. Contact a health care provider if:  You have never been screened for osteoporosis and you are: ? A woman who is age 68 or older. ? A man who is age 13 or older. Get help right away if:  You fall or injure yourself. Summary  Osteoporosis is thinning and loss of density in your bones. This makes bones more brittle and fragile and more likely to break (fracture),even with minor falls.  The goal of treatment is to strengthen your bones and lower your risk for a fracture.  Include calcium and vitamin D in your diet. Calcium is important for bone health, and vitamin D helps your body absorb calcium.  Talk with your health care provider about screening for osteoporosis if you are a woman who is age 62 or older, or a man who is age 23 or older.   Teriparatide injection What is this medicine? TERIPARATIDE (terr ih PAR a tyd) increases bone mass and strength. It helps to make healthy bone and to slow bone loss in people with osteoporosis. This medicine helps prevent bone fractures. This medicine may be used for other purposes; ask your health care provider or pharmacist if you have questions. COMMON BRAND NAME(S): Forteo What should I tell my health care provider before I take this medicine? They need to know if you have any of these conditions:  bone disease other than osteoporosis  high levels of calcium in the blood  history of cancer in the bone  kidney stone  Paget's disease  parathyroid disease  receiving radiation therapy  an unusual or  allergic reaction to teriparatide, other medicines, foods, dyes, or preservatives  pregnant or trying to get pregnant  breast-feeding How should I use this medicine? This medicine is for injection under the skin. You will be taught how to prepare and give this medicine. Use exactly as directed. Take your medicine at regular intervals. Do not take your medicine more often than directed. This drug comes with INSTRUCTIONS FOR USE. Ask your pharmacist for directions on how to use this drug. Read the information carefully. Talk to your pharmacist or health care provider if you have questions. It is important that you put your used needles and pens in a special sharps container. Do not put them in a trash can. If you do not have a sharps container, call your pharmacist or health care provider to get one. A special MedGuide will be given to you by the pharmacist with each prescription and refill. Be sure to read this information carefully each time. Talk to your pediatrician regarding the use of this medicine in children. Special care may be needed. Overdosage:  If you think you have taken too much of this medicine contact a poison control center or emergency room at once. NOTE: This medicine is only for you. Do not share this medicine with others. What if I miss a dose? If you miss a dose, take it as soon as you can. If it is almost time for your next dose, take only that dose. Do not take double or extra doses. What may interact with this medicine?  digoxin This list may not describe all possible interactions. Give your health care provider a list of all the medicines, herbs, non-prescription drugs, or dietary supplements you use. Also tell them if you smoke, drink alcohol, or use illegal drugs. Some items may interact with your medicine. What should I watch for while using this medicine? Visit your doctor or health care professional for regular checks on your progress. Your doctor may order blood  tests and other tests to see how you are doing. You should make sure you get enough calcium and vitamin D while you are taking this medicine, unless your doctor tells you not to. Discuss the foods you eat and the vitamins you take with your health care professional. Dennis Bast may get drowsy or dizzy. Do not drive, use machinery, or do anything that needs mental alertness until you know how this medicine affects you. Do not stand or sit up quickly, especially if you are an older patient. This reduces the risk of dizzy or fainting spells. Talk to your doctor about your risk of cancer. You may be more at risk for certain types of cancers if you take this medicine. What side effects may I notice from receiving this medicine? Side effects that you should report to your doctor or health care professional as soon as possible:  allergic reactions like skin rash, itching or hives, swelling of the face, lips, or tongue  blood in the urine; pain in the lower back or side; pain when urinating  signs and symptoms of low blood pressure like dizziness; feeling faint or lightheaded, falls; unusually weak or tired  signs and symptoms of increased calcium like nausea; vomiting; constipation; low energy; or muscle weakness Side effects that usually do not require medical attention (report these to your doctor or health care professional if they continue or are bothersome):  headache  joint pain  nausea  pain, redness, irritation or swelling at the injection site  stomach upset This list may not describe all possible side effects. Call your doctor for medical advice about side effects. You may report side effects to FDA at 1-800-FDA-1088. Where should I keep my medicine? Keep out of the reach of children. Store the pens in a refrigerator between 2 and 8 degrees C (36 and 46 degrees F). Do not freeze. Use the pen quickly after taking out of the refrigerator and recap and return to refrigerator right after using.  Protect from light. Throw away any unused medicine 28 days after the first injection from the pen. Throw away any unopened, unused medicine after the expiration date on the label.    Alendronate; Cholecalciferol Oral Tablets What is this medicine? ALENDRONATE; CHOLECALCIFEROL (a LEN droe nate; KOL e cal SIF er ol) slows calcium loss from bones. It treats osteoporosis. It may be used in other people at risk for bone loss. This medicine may be used for other purposes; ask your health care provider or pharmacist if you have questions. COMMON BRAND NAME(S): Fosamax Plus D What should I tell my health care  provider before I take this medicine? They need to know if you have any of these conditions:  bleeding disorder  cancer  dental disease  difficulty swallowing  high levels of vitamin D in the blood  infection (fever, chills, cough, sore throat, pain or trouble passing urine)  kidney disease  low levels of calcium or other minerals in the blood  low red blood cell counts  receiving steroids like dexamethasone or prednisone  stomach or intestine problems  trouble sitting or standing for 30 minutes  an unusual or allergic reaction to alendronate, cholecalciferol, other drugs, foods, dyes or preservatives  pregnant or trying to get pregnant  breast-feeding How should I use this medicine? Take this drug by mouth with a full glass of water. Take it as directed on the prescription label at the same day of each week. Take the dose right after waking up. Do not eat or drink anything before taking it. Do not take it with any other drink except water. Do not chew or crush the tablet. After taking it, do not eat breakfast, drink, or take any other drugs or vitamins for at least 30 minutes. Sit or stand up for at least 30 minutes after you take it. Do not lie down. Keep taking it unless your health care provider tells you to stop. A special MedGuide will be given to you by the pharmacist  with each prescription and refill. Be sure to read this information carefully each time. Talk to your health care provider about the use of this drug in children. Special care may be needed. Overdosage: If you think you have taken too much of this medicine contact a poison control center or emergency room at once. NOTE: This medicine is only for you. Do not share this medicine with others. What if I miss a dose? If you take your drug once a day, skip it. Take your next dose at the scheduled time the next morning. Do not take two doses on the same day. If you take your drug once a week, take the missed dose on the morning after you remember. Do not take two doses on the same day. What may interact with this medicine?  antacids  aspirin and aspirin like drugs  calcium supplements, especially calcium with vitamin D  cholestyramine or colestipol  cimetidine, ranitidine, or other medicines used to decrease stomach acid  corticosteroids  iron supplements  magnesium supplements  mineral oil  NSAIDs, medicines for pain and inflammation, like ibuprofen or naproxen  orlistat  phenobarbital  phenytoin  phosphorous supplements  primidone  steroid medicines like prednisone or cortisone  thiazide diuretics  vitamins with minerals, especially with vitamin D This list may not describe all possible interactions. Give your health care provider a list of all the medicines, herbs, non-prescription drugs, or dietary supplements you use. Also tell them if you smoke, drink alcohol, or use illegal drugs. Some items may interact with your medicine. What should I watch for while using this medicine? Visit your health care provider for regular checks on your progress. It may be some time before you see the benefit from this drug. Some people who take this drug have severe bone, joint, or muscle pain. This drug may also increase your risk for jaw problems or a broken thigh bone. Tell your health  care provider right away if you have severe pain in your jaw, bones, joints, or muscles. Tell you health care provider if you have any pain that does not go  away or that gets worse. Tell your dentist and dental surgeon that you are taking this drug. You should not have major dental surgery while on this drug. See your dentist to have a dental exam and fix any dental problems before starting this drug. Take good care of your teeth while on this drug. Make sure you see your dentist for regular follow-up appointments. You should make sure you get enough calcium and vitamin D while you are taking this drug. Discuss the foods you eat and the vitamins you take with your health care provider. You may need blood work done while you are taking this drug. What side effects may I notice from receiving this medicine? Side effects that you should report to your doctor or health care provider as soon as possible:  allergic reactions (skin rash, itching or hives; swelling of the face, lips, or tongue)  bone pain  heartburn (burning feeling in chest, often after eating or when lying down)  jaw pain, especially after dental work  joint pain  low calcium levels (fast heartbeat; muscle cramps or pain; pain, tingling, or numbness in the hands or feet; seizures)  muscle pain  painful or difficulty swallowing  redness, blistering, peeling, or loosening of the skin, including inside the mouth  stomach bleeding (bloody or black, tarry stools; spitting up blood or brown material that looks like coffee grounds) Side effects that usually do not require medical attention (report to your doctor or health care provider if they continue or are bothersome):  constipation  diarrhea  nausea  stomach pain This list may not describe all possible side effects. Call your doctor for medical advice about side effects. You may report side effects to FDA at 1-800-FDA-1088. Where should I keep my medicine? Keep out of the  reach of children and pets. Store at room temperature between 15 and 30 degrees C (59 and 86 degrees F). Protect from light and moisture. Keep this drug in the original packaging until you are ready to take it. Throw away any unused drug after the expiration date.
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
## 2020-12-31 LAB — COMPLETE METABOLIC PANEL WITH GFR
ALT: 15 U/L (ref 6–29)
AST: 16 U/L (ref 10–35)
Albumin: 4.3 g/dL (ref 3.6–5.1)
Alkaline phosphatase (APISO): 46 U/L (ref 37–153)
BUN: 23 mg/dL (ref 7–25)
Calcium: 9.9 mg/dL (ref 8.6–10.4)
Chloride: 106 mmol/L (ref 98–110)
Creat: 0.89 mg/dL (ref 0.50–0.99)
GFR, Est African American: 78 mL/min/{1.73_m2} (ref >=60)
Glucose, Bld: 93 mg/dL (ref 65–99)
Potassium: 4.9 mmol/L (ref 3.5–5.3)
Total Bilirubin: 0.8 mg/dL (ref 0.2–1.2)
Total Protein: 6.4 g/dL (ref 6.1–8.1)

## 2020-12-31 LAB — PARATHYROID HORMONE, INTACT (NO CA): PTH: 37 pg/mL (ref 16–77)

## 2020-12-31 LAB — CALCIUM, URINE, RANDOM: CALCIUM, RANDOM URINE: 14.6 mg/dL

## 2020-12-31 LAB — VITAMIN D 25 HYDROXY (VIT D DEFICIENCY, FRACTURES): Vit D, 25-Hydroxy: 50 ng/mL (ref 30–100)

## 2021-01-01 ENCOUNTER — Telehealth: Payer: Self-pay | Admitting: Pharmacist

## 2021-01-01 DIAGNOSIS — M810 Age-related osteoporosis without current pathological fracture: Secondary | ICD-10-CM

## 2021-01-01 NOTE — Telephone Encounter (Signed)
Please start Forteo BIV. Baseline labs wnl  Dose: every day  Dx: M81.0  History of falls as well as vertebral fractures  - Has not tried oral bisphosphonates (and no contraindications to trying them) but would like more aggressive initial treatment d/t recurrent falls  DEXA 11/13/2020 T-scores as below L Forearm Radius -2.6  R Femur Neck -2.4 Total Femur Mean -1.5 FRAX 8.9% / 1.4% (not accounting glucocorticoids as risk factor)  Chesley Mires, PharmD, MPH Clinical Pharmacist (Rheumatology and Pulmonology)

## 2021-01-04 NOTE — Telephone Encounter (Signed)
Initiated a Prior Authorization request to [facility] for FORTEO via Cover My Meds.   Awaiting clinical questions   (Key: [id number])

## 2021-01-05 NOTE — Telephone Encounter (Signed)
Clinical questions submitted. Awaiting response.

## 2021-01-06 NOTE — Telephone Encounter (Signed)
Received notification from [facility] regarding a prior authorization for FORTEO. Authorization has been APPROVED from 01/06/21 to 01/06/22.   Authorization # [id number]  Per plan, [facility]   Forteo Copay Card:  BIN- [id number]  PCN- [id number]  ID- [id number]   Grp- [id number]

## 2021-01-07 ENCOUNTER — Other Ambulatory Visit: Payer: Self-pay | Admitting: Internal Medicine

## 2021-01-07 DIAGNOSIS — M5116 Intervertebral disc disorders with radiculopathy, lumbar region: Secondary | ICD-10-CM | POA: Diagnosis not present

## 2021-01-07 DIAGNOSIS — M5416 Radiculopathy, lumbar region: Secondary | ICD-10-CM | POA: Diagnosis not present

## 2021-01-08 ENCOUNTER — Other Ambulatory Visit: Payer: Self-pay | Admitting: Urology

## 2021-01-08 MED ORDER — TERIPARATIDE 620 MCG/2.48ML ~~LOC~~ SOPN: 20.0000 ug | PEN_INJECTOR | Freq: Every day | SUBCUTANEOUS | 0 refills | Status: DC

## 2021-01-08 MED ORDER — INSULIN PEN NEEDLE 31G X 5 MM MISC: 5 refills | Status: AC

## 2021-01-08 NOTE — Telephone Encounter (Signed)
Rx for Forteo and pen needles sent to [facility] to be delivered to clinic. Patient provided with copay card information and advised to provide consent to ship to the clinic and collect copay. Will f/u  Chesley Mires, PharmD, MPH Clinical Pharmacist (Rheumatology and Pulmonology)

## 2021-01-11 NOTE — Telephone Encounter (Addendum)
Called [facility] with patient. No copay for Forteo - patient provided consent to ship medication to the clinic. Expected arrival date to clinic is 01/13/21 along with pen needles

## 2021-01-12 NOTE — Telephone Encounter (Signed)
Forteo shipment arrived at clinic along with 28 pen needles. Forteo placed in fridge. Left VM for patient regarding shipment arrival and requesting return call back. She would like to come to clinic to start medication so she could talk more in depth about administration, storage, etc. Will f/u tomorrow  Chesley Mires, PharmD, MPH Clinical Pharmacist (Rheumatology and Pulmonology)

## 2021-01-13 ENCOUNTER — Ambulatory Visit (HOSPITAL_COMMUNITY)
Admission: RE | Admit: 2021-01-13 | Discharge: 2021-01-13 | Disposition: A | Discharge status: Home / Self Care | Payer: Medicare Other | Source: Ambulatory Visit | Attending: Neurosurgery | Admitting: Neurosurgery

## 2021-01-13 ENCOUNTER — Other Ambulatory Visit (HOSPITAL_COMMUNITY): Payer: Self-pay | Admitting: Neurosurgery

## 2021-01-13 ENCOUNTER — Other Ambulatory Visit: Payer: Self-pay

## 2021-01-13 DIAGNOSIS — M7989 Other specified soft tissue disorders: Secondary | ICD-10-CM

## 2021-01-14 NOTE — Telephone Encounter (Signed)
Scheduled to train for Forteo use on Monday, 01/18/21 @ 9am. May be accompanied by her husband  Chesley Mires, PharmD, MPH Clinical Pharmacist (Rheumatology and Pulmonology)

## 2021-01-18 ENCOUNTER — Other Ambulatory Visit: Payer: Self-pay

## 2021-01-18 ENCOUNTER — Ambulatory Visit: Payer: Medicare Other | Admitting: Pharmacist

## 2021-01-18 DIAGNOSIS — M810 Age-related osteoporosis without current pathological fracture: Secondary | ICD-10-CM

## 2021-01-18 MED ORDER — TERIPARATIDE 620 MCG/2.48ML ~~LOC~~ SOPN: 20.0000 ug | PEN_INJECTOR | Freq: Every day | SUBCUTANEOUS | 4 refills | Status: DC

## 2021-01-18 NOTE — Progress Notes (Signed)
Pharmacy Note  Subjective:   Ms. Tena presents with her spouse Jonny Ruiz) to clinic today to receive first dose of Forteo.  Patient previously used to self-administer Rebif for multiple sclerosis but this is an autoinjector.  Patient running a fever or have signs/symptoms of infection? No  Patient currently on antibiotics for the treatment of infection? No  Patient have any upcoming invasive procedures/surgeries? No  Objective: CMP     Component Value Date/Time   NA 143 12/30/2020 0901   NA 144 09/20/2019 1052   K 4.9 12/30/2020 0901   CL 106 12/30/2020 0901   CO2 31 12/30/2020 0901   GLUCOSE 93 12/30/2020 0901   BUN 23 12/30/2020 0901   BUN 23 09/20/2019 1052   CREATININE 0.89 12/30/2020 0901   CALCIUM 9.9 12/30/2020 0901   PROT 6.4 12/30/2020 0901   ALBUMIN 3.9 04/22/2017 0951   AST 16 12/30/2020 0901   ALT 15 12/30/2020 0901   ALKPHOS 49 04/22/2017 0951   BILITOT 0.8 12/30/2020 0901   GFRNONAA 67 12/30/2020 0901   GFRAA 78 12/30/2020 0901    CBC    Component Value Date/Time   WBC 5.0 09/20/2019 1052   WBC 5.4 08/14/2017 1415   RBC 4.00 09/20/2019 1052   RBC 4.33 08/14/2017 1415   HGB 12.2 09/20/2019 1052   HCT 36.3 09/20/2019 1052   PLT 183 09/20/2019 1052   MCV 91 09/20/2019 1052   MCH 30.5 09/20/2019 1052   MCH 30.4 08/14/2017 1415   MCHC 33.6 09/20/2019 1052   MCHC 33.4 08/14/2017 1415   RDW 12.8 09/20/2019 1052   LYMPHSABS 2.0 07/11/2015 1728   MONOABS 0.4 07/11/2015 1728   EOSABS 0.1 07/11/2015 1728   BASOSABS 0.0 07/11/2015 1728    PTH    Component Value Date/Time   PTH 37 12/30/2020 0901    Assessment/Plan:  Discussed that Forteo will be used for maximum of 2 years and then transitioned to another medication for osteoporosis.   Demonstrated proper injection technique with tdemo device  Patient able to demonstrate proper injection technique using the teach back method.  Patient self injected in the left lower abdomen with:  Used teriparitide  pen that was shipped from [facility]. Patient tolerated well.   Forteo approved through insurance.  Prescription sent to [facility] - patient provided with pharmacy phone number and advised to call to schedule shipment when 1 week of medication is due. Pharmacy dispensed generic teriparatide with copay card for generic pen. Patient provided with printed instructions for use and discussed how to troubleshoot pen malfunction.  BIN- [id number]; PCN- [id number]; Group- [id number]; ID- [id number]  All questions encouraged and answered.  Instructed patient to call with any further questions or concerns.  Chesley Mires, PharmD, MPH Clinical Pharmacist (Rheumatology and Pulmonology)  01/18/2021 8:20 AM

## 2021-01-18 NOTE — Patient Instructions (Incomplete)
Your Forteo device is good for 28 days once opened. Current pen expires 02/15/21.  Remember to place the device back into the fridge after using it every day  Your prescription will be shipped from [facility]. Their phone number is :  [phone]  Please call to schedule shipment and confirm address. They will mail medication to your home.  Your copay should be affordable. If you call the pharmacy and it is not affordable, please double-check that they are billing through your copay card as secondary coverage. That copay card information is: BIN# [id number] PCN# [id number] GRP# ECALVOGEN ID# [id number]  Remember the 5 C's:  COLD - place something cold (like an ice gel pack or cold water bottle) on the injection site just before cleansing with alcohol. This may help reduce pain  CLARITIN - use Claritin (generic name is loratadine) for the first two weeks of treatment or the day of, the day before, and the day after injecting. This will help to minimize injection site reactions  CORTISONE CREAM - apply if injection site is irritated and itching  CALL ME - if injection site reaction is bigger than the size of your fist, looks infected, blisters, or if you develop hives

## 2021-02-02 DIAGNOSIS — R2689 Other abnormalities of gait and mobility: Secondary | ICD-10-CM | POA: Diagnosis not present

## 2021-02-02 DIAGNOSIS — M6281 Muscle weakness (generalized): Secondary | ICD-10-CM | POA: Diagnosis not present

## 2021-02-02 DIAGNOSIS — M5416 Radiculopathy, lumbar region: Secondary | ICD-10-CM | POA: Diagnosis not present

## 2021-02-02 DIAGNOSIS — M25551 Pain in right hip: Secondary | ICD-10-CM | POA: Diagnosis not present

## 2021-02-04 DIAGNOSIS — M25551 Pain in right hip: Secondary | ICD-10-CM | POA: Diagnosis not present

## 2021-02-04 DIAGNOSIS — M5416 Radiculopathy, lumbar region: Secondary | ICD-10-CM | POA: Diagnosis not present

## 2021-02-04 DIAGNOSIS — R2689 Other abnormalities of gait and mobility: Secondary | ICD-10-CM | POA: Diagnosis not present

## 2021-02-04 DIAGNOSIS — M6281 Muscle weakness (generalized): Secondary | ICD-10-CM | POA: Diagnosis not present

## 2021-02-08 DIAGNOSIS — M6281 Muscle weakness (generalized): Secondary | ICD-10-CM | POA: Diagnosis not present

## 2021-02-08 DIAGNOSIS — R2689 Other abnormalities of gait and mobility: Secondary | ICD-10-CM | POA: Diagnosis not present

## 2021-02-08 DIAGNOSIS — M25551 Pain in right hip: Secondary | ICD-10-CM | POA: Diagnosis not present

## 2021-02-08 DIAGNOSIS — M5416 Radiculopathy, lumbar region: Secondary | ICD-10-CM | POA: Diagnosis not present

## 2021-02-10 ENCOUNTER — Other Ambulatory Visit: Payer: Self-pay

## 2021-02-10 ENCOUNTER — Ambulatory Visit (INDEPENDENT_AMBULATORY_CARE_PROVIDER_SITE_OTHER): Payer: Medicare Other | Admitting: Internal Medicine

## 2021-02-10 ENCOUNTER — Encounter: Payer: Self-pay | Admitting: Internal Medicine

## 2021-02-10 ENCOUNTER — Telehealth: Payer: Self-pay

## 2021-02-10 DIAGNOSIS — I491 Atrial premature depolarization: Secondary | ICD-10-CM | POA: Insufficient documentation

## 2021-02-10 NOTE — Telephone Encounter (Signed)
Patient's husband called stating he has questions regarding Ralene's forteo medication and requested a return call.

## 2021-02-10 NOTE — Patient Instructions (Signed)
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
## 2021-02-11 DIAGNOSIS — M5416 Radiculopathy, lumbar region: Secondary | ICD-10-CM | POA: Diagnosis not present

## 2021-02-11 DIAGNOSIS — R2689 Other abnormalities of gait and mobility: Secondary | ICD-10-CM | POA: Diagnosis not present

## 2021-02-11 DIAGNOSIS — M25551 Pain in right hip: Secondary | ICD-10-CM | POA: Diagnosis not present

## 2021-02-11 DIAGNOSIS — M6281 Muscle weakness (generalized): Secondary | ICD-10-CM | POA: Diagnosis not present

## 2021-02-11 NOTE — Telephone Encounter (Signed)
Attempted to contact patient, LVM advising patient to call the office. 

## 2021-02-12 NOTE — Telephone Encounter (Signed)
LMOM returned call

## 2021-02-16 DIAGNOSIS — M5416 Radiculopathy, lumbar region: Secondary | ICD-10-CM | POA: Diagnosis not present

## 2021-02-16 DIAGNOSIS — M6281 Muscle weakness (generalized): Secondary | ICD-10-CM | POA: Diagnosis not present

## 2021-02-16 DIAGNOSIS — R2689 Other abnormalities of gait and mobility: Secondary | ICD-10-CM | POA: Diagnosis not present

## 2021-02-16 DIAGNOSIS — M25551 Pain in right hip: Secondary | ICD-10-CM | POA: Diagnosis not present

## 2021-02-17 ENCOUNTER — Other Ambulatory Visit: Payer: Self-pay | Admitting: Internal Medicine

## 2021-02-18 ENCOUNTER — Other Ambulatory Visit: Payer: Self-pay | Admitting: Internal Medicine

## 2021-02-18 DIAGNOSIS — M25551 Pain in right hip: Secondary | ICD-10-CM | POA: Diagnosis not present

## 2021-02-18 DIAGNOSIS — M6281 Muscle weakness (generalized): Secondary | ICD-10-CM | POA: Diagnosis not present

## 2021-02-18 DIAGNOSIS — M5416 Radiculopathy, lumbar region: Secondary | ICD-10-CM | POA: Diagnosis not present

## 2021-02-18 DIAGNOSIS — R2689 Other abnormalities of gait and mobility: Secondary | ICD-10-CM | POA: Diagnosis not present

## 2021-02-18 MED ORDER — PROPRANOLOL HCL ER 60 MG PO CP24: 60.0000 mg | ORAL_CAPSULE | Freq: Every day | ORAL | 3 refills | Status: DC

## 2021-02-22 DIAGNOSIS — R2689 Other abnormalities of gait and mobility: Secondary | ICD-10-CM | POA: Diagnosis not present

## 2021-02-22 DIAGNOSIS — M5416 Radiculopathy, lumbar region: Secondary | ICD-10-CM | POA: Diagnosis not present

## 2021-02-22 DIAGNOSIS — M6281 Muscle weakness (generalized): Secondary | ICD-10-CM | POA: Diagnosis not present

## 2021-02-22 DIAGNOSIS — M25551 Pain in right hip: Secondary | ICD-10-CM | POA: Diagnosis not present

## 2021-02-24 ENCOUNTER — Other Ambulatory Visit: Payer: Self-pay | Admitting: Neurosurgery

## 2021-02-24 DIAGNOSIS — M25551 Pain in right hip: Secondary | ICD-10-CM | POA: Diagnosis not present

## 2021-02-24 DIAGNOSIS — M5416 Radiculopathy, lumbar region: Secondary | ICD-10-CM | POA: Diagnosis not present

## 2021-02-24 DIAGNOSIS — M6281 Muscle weakness (generalized): Secondary | ICD-10-CM | POA: Diagnosis not present

## 2021-02-24 DIAGNOSIS — R2689 Other abnormalities of gait and mobility: Secondary | ICD-10-CM | POA: Diagnosis not present

## 2021-02-25 ENCOUNTER — Other Ambulatory Visit: Payer: Self-pay | Admitting: Family Medicine

## 2021-02-25 ENCOUNTER — Telehealth: Payer: Self-pay

## 2021-02-25 DIAGNOSIS — Z1231 Encounter for screening mammogram for malignant neoplasm of breast: Secondary | ICD-10-CM

## 2021-02-25 DIAGNOSIS — G35 Multiple sclerosis: Secondary | ICD-10-CM | POA: Diagnosis not present

## 2021-02-25 NOTE — Telephone Encounter (Signed)
Patient requested a return call to let her know when Dr. Dimple Casey wants her to schedule a follow-up appointment.  Patient states she is feeling great.

## 2021-02-26 DIAGNOSIS — G35 Multiple sclerosis: Secondary | ICD-10-CM | POA: Diagnosis not present

## 2021-02-26 DIAGNOSIS — H53483 Generalized contraction of visual field, bilateral: Secondary | ICD-10-CM | POA: Diagnosis not present

## 2021-02-26 DIAGNOSIS — H53462 Homonymous bilateral field defects, left side: Secondary | ICD-10-CM | POA: Diagnosis not present

## 2021-02-26 DIAGNOSIS — H532 Diplopia: Secondary | ICD-10-CM | POA: Diagnosis not present

## 2021-02-26 NOTE — Telephone Encounter (Signed)
I am glad she is feeling well! If tolerating well with Forteo treatment I generally like to follow up to recheck for the blood calcium, vitamin D, and kidney test at least every 6 months to make sure there are no issues with the medication and dose. That would be early October for [name] I believe.

## 2021-02-26 NOTE — Telephone Encounter (Signed)
Patient is now scheduled for 07/13/2021 for a follow up appointment and labs.

## 2021-03-01 DIAGNOSIS — M25551 Pain in right hip: Secondary | ICD-10-CM | POA: Diagnosis not present

## 2021-03-01 DIAGNOSIS — M5416 Radiculopathy, lumbar region: Secondary | ICD-10-CM | POA: Diagnosis not present

## 2021-03-01 DIAGNOSIS — R2689 Other abnormalities of gait and mobility: Secondary | ICD-10-CM | POA: Diagnosis not present

## 2021-03-01 DIAGNOSIS — M6281 Muscle weakness (generalized): Secondary | ICD-10-CM | POA: Diagnosis not present

## 2021-03-04 DIAGNOSIS — M25551 Pain in right hip: Secondary | ICD-10-CM | POA: Diagnosis not present

## 2021-03-04 DIAGNOSIS — M5416 Radiculopathy, lumbar region: Secondary | ICD-10-CM | POA: Diagnosis not present

## 2021-03-04 DIAGNOSIS — R2689 Other abnormalities of gait and mobility: Secondary | ICD-10-CM | POA: Diagnosis not present

## 2021-03-04 DIAGNOSIS — M6281 Muscle weakness (generalized): Secondary | ICD-10-CM | POA: Diagnosis not present

## 2021-03-10 ENCOUNTER — Ambulatory Visit
Admission: RE | Admit: 2021-03-10 | Discharge: 2021-03-10 | Disposition: A | Discharge status: Home / Self Care | Payer: Medicare Other | Source: Ambulatory Visit | Attending: Neurosurgery | Admitting: Neurosurgery

## 2021-03-10 ENCOUNTER — Other Ambulatory Visit: Payer: Self-pay

## 2021-03-10 DIAGNOSIS — M545 Low back pain, unspecified: Secondary | ICD-10-CM | POA: Diagnosis not present

## 2021-03-10 DIAGNOSIS — M5416 Radiculopathy, lumbar region: Secondary | ICD-10-CM | POA: Diagnosis not present

## 2021-03-10 MED ORDER — GADOBUTROL 1 MMOL/ML IV SOLN
5.0000 mL | Freq: Once | INTRAVENOUS | Status: AC | PRN
  Administered 2021-03-10: 5 mL via INTRAVENOUS

## 2021-03-11 DIAGNOSIS — M5416 Radiculopathy, lumbar region: Secondary | ICD-10-CM | POA: Diagnosis not present

## 2021-03-11 DIAGNOSIS — M6281 Muscle weakness (generalized): Secondary | ICD-10-CM | POA: Diagnosis not present

## 2021-03-11 DIAGNOSIS — M25551 Pain in right hip: Secondary | ICD-10-CM | POA: Diagnosis not present

## 2021-03-11 DIAGNOSIS — R2689 Other abnormalities of gait and mobility: Secondary | ICD-10-CM | POA: Diagnosis not present

## 2021-03-15 DIAGNOSIS — M6281 Muscle weakness (generalized): Secondary | ICD-10-CM | POA: Diagnosis not present

## 2021-03-15 DIAGNOSIS — M25551 Pain in right hip: Secondary | ICD-10-CM | POA: Diagnosis not present

## 2021-03-15 DIAGNOSIS — R2689 Other abnormalities of gait and mobility: Secondary | ICD-10-CM | POA: Diagnosis not present

## 2021-03-15 DIAGNOSIS — M5416 Radiculopathy, lumbar region: Secondary | ICD-10-CM | POA: Diagnosis not present

## 2021-03-17 DIAGNOSIS — M25551 Pain in right hip: Secondary | ICD-10-CM | POA: Diagnosis not present

## 2021-03-17 DIAGNOSIS — R2689 Other abnormalities of gait and mobility: Secondary | ICD-10-CM | POA: Diagnosis not present

## 2021-03-17 DIAGNOSIS — M5416 Radiculopathy, lumbar region: Secondary | ICD-10-CM | POA: Diagnosis not present

## 2021-03-17 DIAGNOSIS — M6281 Muscle weakness (generalized): Secondary | ICD-10-CM | POA: Diagnosis not present

## 2021-03-22 DIAGNOSIS — R2689 Other abnormalities of gait and mobility: Secondary | ICD-10-CM | POA: Diagnosis not present

## 2021-03-22 DIAGNOSIS — M5416 Radiculopathy, lumbar region: Secondary | ICD-10-CM | POA: Diagnosis not present

## 2021-03-22 DIAGNOSIS — M25551 Pain in right hip: Secondary | ICD-10-CM | POA: Diagnosis not present

## 2021-03-22 DIAGNOSIS — M6281 Muscle weakness (generalized): Secondary | ICD-10-CM | POA: Diagnosis not present

## 2021-03-24 DIAGNOSIS — M6281 Muscle weakness (generalized): Secondary | ICD-10-CM | POA: Diagnosis not present

## 2021-03-24 DIAGNOSIS — M25551 Pain in right hip: Secondary | ICD-10-CM | POA: Diagnosis not present

## 2021-03-24 DIAGNOSIS — R2689 Other abnormalities of gait and mobility: Secondary | ICD-10-CM | POA: Diagnosis not present

## 2021-03-24 DIAGNOSIS — M5416 Radiculopathy, lumbar region: Secondary | ICD-10-CM | POA: Diagnosis not present

## 2021-04-01 DIAGNOSIS — J069 Acute upper respiratory infection, unspecified: Secondary | ICD-10-CM | POA: Diagnosis not present

## 2021-04-21 DIAGNOSIS — K582 Mixed irritable bowel syndrome: Secondary | ICD-10-CM | POA: Diagnosis not present

## 2021-04-21 DIAGNOSIS — K6389 Other specified diseases of intestine: Secondary | ICD-10-CM | POA: Diagnosis not present

## 2021-04-23 ENCOUNTER — Other Ambulatory Visit: Payer: Self-pay

## 2021-04-23 ENCOUNTER — Ambulatory Visit
Admission: RE | Admit: 2021-04-23 | Discharge: 2021-04-23 | Disposition: A | Discharge status: Home / Self Care | Payer: Medicare Other | Source: Ambulatory Visit | Attending: Family Medicine | Admitting: Family Medicine

## 2021-04-23 DIAGNOSIS — Z1231 Encounter for screening mammogram for malignant neoplasm of breast: Secondary | ICD-10-CM

## 2021-06-15 ENCOUNTER — Other Ambulatory Visit: Payer: Self-pay | Admitting: Internal Medicine

## 2021-06-15 DIAGNOSIS — M810 Age-related osteoporosis without current pathological fracture: Secondary | ICD-10-CM

## 2021-06-15 NOTE — Telephone Encounter (Signed)
Next Visit: 07/13/2021  Last Visit: 12/30/2020  Last Fill: 01/18/2021, Inject 20 mcg into the skin daily  DX: Age-related osteoporosis without current pathological fracture   Current Dose per office note 12/30/2020: not mentioned  Labs: 12/30/2020  Okay to refill Forteo?

## 2021-06-17 DIAGNOSIS — L570 Actinic keratosis: Secondary | ICD-10-CM | POA: Diagnosis not present

## 2021-06-17 DIAGNOSIS — L578 Other skin changes due to chronic exposure to nonionizing radiation: Secondary | ICD-10-CM | POA: Diagnosis not present

## 2021-06-21 DIAGNOSIS — M810 Age-related osteoporosis without current pathological fracture: Secondary | ICD-10-CM | POA: Diagnosis not present

## 2021-06-23 ENCOUNTER — Other Ambulatory Visit: Payer: Self-pay | Admitting: Neurosurgery

## 2021-07-09 ENCOUNTER — Other Ambulatory Visit: Payer: Self-pay | Admitting: Urology

## 2021-07-11 DIAGNOSIS — R059 Cough, unspecified: Secondary | ICD-10-CM | POA: Diagnosis not present

## 2021-07-11 DIAGNOSIS — Z20822 Contact with and (suspected) exposure to covid-19: Secondary | ICD-10-CM | POA: Diagnosis not present

## 2021-07-11 DIAGNOSIS — B349 Viral infection, unspecified: Secondary | ICD-10-CM | POA: Diagnosis not present

## 2021-07-12 ENCOUNTER — Telehealth: Payer: Self-pay | Admitting: Urology

## 2021-07-12 NOTE — Telephone Encounter (Signed)
Pt scheduled for Friday with shannon, will discuss refill then.

## 2021-07-12 NOTE — Telephone Encounter (Signed)
Pt LMOM wanting a refill of her Myrbetriq.

## 2021-07-13 ENCOUNTER — Other Ambulatory Visit: Payer: Self-pay

## 2021-07-13 ENCOUNTER — Ambulatory Visit (INDEPENDENT_AMBULATORY_CARE_PROVIDER_SITE_OTHER): Payer: Medicare Other | Admitting: Internal Medicine

## 2021-07-13 ENCOUNTER — Encounter: Payer: Self-pay | Admitting: Internal Medicine

## 2021-07-13 VITALS — BP 116/74 | HR 61 | Ht 65.0 in | Wt 119.4 lb

## 2021-07-13 DIAGNOSIS — G35 Multiple sclerosis: Secondary | ICD-10-CM | POA: Diagnosis not present

## 2021-07-13 DIAGNOSIS — G6289 Other specified polyneuropathies: Secondary | ICD-10-CM

## 2021-07-13 DIAGNOSIS — M810 Age-related osteoporosis without current pathological fracture: Secondary | ICD-10-CM
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
## 2021-07-13 NOTE — Patient Instructions (Signed)
Plan to continue the Forteo once daily. You started this medication 01/18/21, so repeat bone density testing around 1 year from that date would be most in keeping with guidelines for determining response to treatment.

## 2021-07-14 LAB — COMPLETE METABOLIC PANEL WITH GFR
AG Ratio: 1.7 (calc) (ref 1.0–2.5)
ALT: 8 U/L (ref 6–29)
AST: 14 U/L (ref 10–35)
Albumin: 3.8 g/dL (ref 3.6–5.1)
Alkaline phosphatase (APISO): 77 U/L (ref 37–153)
BUN: 24 mg/dL (ref 7–25)
CO2: 32 mmol/L (ref 20–32)
Calcium: 9.8 mg/dL (ref 8.6–10.4)
Chloride: 105 mmol/L (ref 98–110)
Creat: 0.68 mg/dL (ref 0.50–1.05)
Globulin: 2.3 g/dL (calc) (ref 1.9–3.7)
Glucose, Bld: 76 mg/dL (ref 65–99)
Potassium: 4.5 mmol/L (ref 3.5–5.3)
Sodium: 144 mmol/L (ref 135–146)
Total Bilirubin: 0.6 mg/dL (ref 0.2–1.2)
Total Protein: 6.1 g/dL (ref 6.1–8.1)
eGFR: 95 mL/min/{1.73_m2} (ref >=60)

## 2021-07-14 NOTE — Progress Notes (Signed)
Blood test for calcium and kidney function is completely normal, so can continue the Forteo with no change at this time.

## 2021-07-15 DIAGNOSIS — F900 Attention-deficit hyperactivity disorder, predominantly inattentive type: Secondary | ICD-10-CM | POA: Diagnosis not present

## 2021-07-15 DIAGNOSIS — E782 Mixed hyperlipidemia: Secondary | ICD-10-CM | POA: Diagnosis not present

## 2021-07-15 DIAGNOSIS — K580 Irritable bowel syndrome with diarrhea: Secondary | ICD-10-CM | POA: Diagnosis not present

## 2021-07-15 DIAGNOSIS — Z23 Encounter for immunization: Secondary | ICD-10-CM | POA: Diagnosis not present

## 2021-07-15 DIAGNOSIS — G35 Multiple sclerosis: Secondary | ICD-10-CM | POA: Diagnosis not present

## 2021-07-15 DIAGNOSIS — R636 Underweight: Secondary | ICD-10-CM | POA: Diagnosis not present

## 2021-07-15 DIAGNOSIS — Z0000 Encounter for general adult medical examination without abnormal findings: Secondary | ICD-10-CM | POA: Diagnosis not present

## 2021-07-15 DIAGNOSIS — F322 Major depressive disorder, single episode, severe without psychotic features: Secondary | ICD-10-CM | POA: Diagnosis not present

## 2021-07-15 DIAGNOSIS — M810 Age-related osteoporosis without current pathological fracture: Secondary | ICD-10-CM | POA: Diagnosis not present

## 2021-07-15 DIAGNOSIS — F411 Generalized anxiety disorder: Secondary | ICD-10-CM | POA: Diagnosis not present
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
## 2021-07-16 ENCOUNTER — Ambulatory Visit (INDEPENDENT_AMBULATORY_CARE_PROVIDER_SITE_OTHER): Payer: Medicare Other | Admitting: Urology

## 2021-07-16 ENCOUNTER — Other Ambulatory Visit: Payer: Self-pay

## 2021-07-16 ENCOUNTER — Encounter: Payer: Self-pay | Admitting: Urology

## 2021-07-16 VITALS — BP 98/65 | HR 61 | Ht 65.0 in | Wt 119.0 lb

## 2021-07-16 DIAGNOSIS — N3941 Urge incontinence: Secondary | ICD-10-CM | POA: Diagnosis not present

## 2021-07-16 LAB — BLADDER SCAN AMB NON-IMAGING: Scan Result: 0

## 2021-07-16 MED ORDER — GEMTESA 75 MG PO TABS: 75.0000 mg | ORAL_TABLET | Freq: Every day | ORAL | 0 refills | Status: DC

## 2021-07-19 DIAGNOSIS — L570 Actinic keratosis: Secondary | ICD-10-CM | POA: Diagnosis not present
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
## 2021-08-06 ENCOUNTER — Ambulatory Visit: Payer: BC Managed Care – PPO | Admitting: Urology

## 2021-08-06 DIAGNOSIS — N3941 Urge incontinence: Secondary | ICD-10-CM

## 2021-08-30 ENCOUNTER — Telehealth: Payer: Self-pay

## 2021-08-30 NOTE — Telephone Encounter (Signed)
Patient called stating she is having pain in her quadriceps muscle of right leg.  Patient states she was reading about the possible side effects of the Forteo medication and is not sure if the pain she is experiencing is from the medication or from her previous back surgery.  Patient requested a return call.

## 2021-08-31 NOTE — Telephone Encounter (Signed)
FYI- I spoke with Ms. riemenschneider symptoms sound atypical for being related to the forteo medication leg pain sounding more muscular and back pain predates starting this medication. I recommended she can continue and if symptoms are not improving at all for another 2 weeks reach out and would schedule an appointment to take a closer look before recommending any treatment or interrupting the medication.

## 2021-09-08 ENCOUNTER — Other Ambulatory Visit: Payer: Self-pay | Admitting: Internal Medicine

## 2021-09-09 DIAGNOSIS — Z79899 Other long term (current) drug therapy: Secondary | ICD-10-CM | POA: Diagnosis not present

## 2021-09-09 DIAGNOSIS — G35 Multiple sclerosis: Secondary | ICD-10-CM | POA: Diagnosis not present

## 2021-09-09 DIAGNOSIS — M5136 Other intervertebral disc degeneration, lumbar region: Secondary | ICD-10-CM | POA: Diagnosis not present

## 2021-09-27 ENCOUNTER — Telehealth: Payer: Self-pay | Admitting: Internal Medicine

## 2021-09-27 DIAGNOSIS — M4316 Spondylolisthesis, lumbar region: Secondary | ICD-10-CM | POA: Diagnosis not present

## 2021-09-27 NOTE — Telephone Encounter (Signed)
April from Dr. Joanette Gula office called the office stating they had seen [name] today and were requesting her Dexa scan to be faxed to their office. Fax #[phone]

## 2021-09-28 NOTE — Telephone Encounter (Signed)
I called April with Dr. Joanette Gula' office, Dr. Clelia Croft ordered DEXA at the [facility], April will call Dr. Clelia Croft for results.

## 2021-10-20 ENCOUNTER — Telehealth: Payer: Self-pay | Admitting: Pharmacist

## 2021-10-20 ENCOUNTER — Other Ambulatory Visit (HOSPITAL_COMMUNITY): Payer: Self-pay

## 2021-10-20 NOTE — Telephone Encounter (Signed)
Submitted a Prior Authorization renewal request to [facility] for FORTEO (brand name) via CoverMyMeds. Will update once we receive a response.  Key: [id number] Case ID: [id number]  Chesley Mires, PharmD, MPH, BCPS Clinical Pharmacist (Rheumatology and Pulmonology)

## 2021-10-25 NOTE — Telephone Encounter (Signed)
Received notification from [facility] regarding a prior authorization for FORTEO. Authorization has been APPROVED through 10/09/22.  Insurance will only cover 30 day supply at once  Patient can continue to fill through [facility].  Chesley Mires, PharmD, MPH, BCPS Clinical Pharmacist (Rheumatology and Pulmonology)

## 2021-11-01 ENCOUNTER — Telehealth: Payer: Self-pay

## 2021-11-01 NOTE — Telephone Encounter (Addendum)
Enrolled patient into new Forteo copay card (active through 10/09/22) BIN: [id number] PCN: [id number] Group: [id number] ID: [id number]  Provided copay card information to [facility] via phone today. Per rep, patient will have to call pharmacy and request rx be re-billed through copay card. Rx was billed through insurance on 10/22/21 and copay card was activated on 11/01/21. Did review with patient's husband that pharmacy may be unable to process for this rx they receive tomorrow but will be able to use for further refills. He verbalized understanding.   Chesley Mires, PharmD, MPH, BCPS Clinical Pharmacist (Rheumatology and Pulmonology)

## 2021-11-01 NOTE — Telephone Encounter (Signed)
John (patient's husband) called stating they called [facility] to request a refill of Autum's Forteo medication and was told since her insurance changed the copay card they have on file is no longer active.  They were told to contact our office to request a new copay card.  The cost without the card is $64.00

## 2021-11-02 NOTE — Telephone Encounter (Signed)
Received a fax regarding Prior Authorization from [facility] for FORTEO. Authorization has been DENIED because: The PA is approved for/through 10/09/2022. However, at least one other issue must be resolved before your drug is fully approved: You asked for the drug above for a 84 day supply. Page 5 of your Prescription Drug Guide (PDG) says that some drugs are limited to a 30-day supply. Due to this drugs cost, your plan only allows up to a 30-day supply of this drug per fill.   Patient can continue to fill Forteo but only for 28 day supply (1 pen at a time).  Chesley Mires, PharmD, MPH, BCPS Clinical Pharmacist (Rheumatology and Pulmonology)

## 2021-11-10 DIAGNOSIS — T8859XA Other complications of anesthesia, initial encounter: Secondary | ICD-10-CM

## 2021-11-10 HISTORY — DX: Other complications of anesthesia, initial encounter: T88.59XA

## 2021-11-24 ENCOUNTER — Other Ambulatory Visit (HOSPITAL_COMMUNITY): Payer: Self-pay

## 2021-11-24 ENCOUNTER — Telehealth: Payer: Self-pay | Admitting: Pharmacist

## 2021-11-24 HISTORY — PX: BACK SURGERY: SHX140

## 2021-11-24 NOTE — Telephone Encounter (Signed)
Received call from patient's husband that copay card is not working.   [facility]oke with [facility] who state patient now has Medicare coverage. Per eligibility check, patient has Medicare Part D through [facility]. Spoke with husband who states that the [place] was discontinued and transferred to [facility] automatically for patient. I advised that because she has insurance coverage through Medicare, she will not qualify for copay card any longer. He states copay is around $65 per month and they can afford this. Advised him to reach back out to clinic if copay significantly increases. He verbalized understanding  Knox Saliva, PharmD, MPH, BCPS Clinical Pharmacist (Rheumatology and Pulmonology)

## 2021-11-29 ENCOUNTER — Other Ambulatory Visit (HOSPITAL_COMMUNITY): Payer: Self-pay

## 2021-11-29 ENCOUNTER — Telehealth: Payer: Self-pay | Admitting: Pharmacist

## 2021-11-29 DIAGNOSIS — M810 Age-related osteoporosis without current pathological fracture: Secondary | ICD-10-CM

## 2021-11-29 MED ORDER — FORTEO 600 MCG/2.4ML ~~LOC~~ SOPN: 20.0000 ug | PEN_INJECTOR | Freq: Every day | SUBCUTANEOUS | 1 refills | Status: DC

## 2021-11-29 NOTE — Telephone Encounter (Signed)
Patient's husband called stating he received a letter dated 11/24/21 that states that Forteo brand is preferred. Per test claim, copay is $100 for 28 day supply of brand name. Rx sent to [facility]. Patient's husband provided with pharmacy phone number.  Called [facility] to cancel teriparatide rx at their pharmacy. Spoke with [facility], who discontinued rx.  Chesley Mires, PharmD, MPH, BCPS Clinical Pharmacist (Rheumatology and Pulmonology)

## 2022-01-11 ENCOUNTER — Ambulatory Visit: Payer: Medicare Other | Admitting: Internal Medicine

## 2022-01-17 ENCOUNTER — Telehealth: Payer: Self-pay | Admitting: Internal Medicine

## 2022-01-17 NOTE — Telephone Encounter (Signed)
error 

## 2022-01-18 ENCOUNTER — Encounter: Payer: Self-pay | Admitting: Student

## 2022-01-18 ENCOUNTER — Ambulatory Visit (INDEPENDENT_AMBULATORY_CARE_PROVIDER_SITE_OTHER): Payer: Medicare PPO | Admitting: Student

## 2022-01-18 VITALS — BP 112/60 | HR 79 | Ht 65.0 in | Wt 106.4 lb

## 2022-01-18 DIAGNOSIS — R002 Palpitations: Secondary | ICD-10-CM

## 2022-01-18 DIAGNOSIS — I491 Atrial premature depolarization: Secondary | ICD-10-CM
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
## 2022-01-18 NOTE — Patient Instructions (Signed)
Medication Instructions:  ?Your physician recommends that you continue on your current medications as directed. Please refer to the Current Medication list given to you today. ? ?*If you need a refill on your cardiac medications before your next appointment, please call your pharmacy* ? ? ?Lab Work: ?None ?If you have labs (blood work) drawn today and your tests are completely normal, you will receive your results only by: ?MyChart Message (if you have MyChart) OR ?A paper copy in the mail ?If you have any lab test that is abnormal or we need to change your treatment, we will call you to review the results. ? ? ?Follow-Up: ?At [facility], you and your health needs are our priority.  As part of our continuing mission to provide you with exceptional heart care, we have created designated Provider Care Teams.  These Care Teams include your primary Cardiologist (physician) and Advanced Practice Providers (APPs -  Physician Assistants and Nurse Practitioners) who all work together to provide you with the care you need, when you need it. ? ? ?Your next appointment:   ?1 year(s) ? ?The format for your next appointment:   ?In Person ? ?Provider:   ?Cristopher Peru, MD  ? ?Important Information About Sugar ? ? ? ? ?  ?

## 2022-01-20 ENCOUNTER — Other Ambulatory Visit: Payer: Self-pay | Admitting: Family Medicine

## 2022-01-20 DIAGNOSIS — F05 Delirium due to known physiological condition: Secondary | ICD-10-CM

## 2022-01-21 ENCOUNTER — Ambulatory Visit
Admission: RE | Admit: 2022-01-21 | Discharge: 2022-01-21 | Disposition: A | Discharge status: Home / Self Care | Payer: Medicare PPO | Source: Ambulatory Visit | Attending: Family Medicine | Admitting: Family Medicine

## 2022-01-21 DIAGNOSIS — F05 Delirium due to known physiological condition: Secondary | ICD-10-CM

## 2022-01-21 MED ORDER — GADOBENATE DIMEGLUMINE 529 MG/ML IV SOLN
9.0000 mL | Freq: Once | INTRAVENOUS | Status: AC | PRN
  Administered 2022-01-21: 9 mL via INTRAVENOUS

## 2022-01-25 ENCOUNTER — Ambulatory Visit: Payer: Medicare Other | Admitting: Internal Medicine

## 2022-02-07 ENCOUNTER — Other Ambulatory Visit: Payer: Self-pay

## 2022-02-07 ENCOUNTER — Encounter (HOSPITAL_COMMUNITY): Payer: Self-pay

## 2022-02-07 ENCOUNTER — Emergency Department (HOSPITAL_COMMUNITY)
Admission: EM | Admit: 2022-02-07 | Discharge: 2022-02-07 | Disposition: A | Discharge status: Home / Self Care | Payer: Medicare PPO | Attending: Emergency Medicine | Admitting: Emergency Medicine

## 2022-02-07 ENCOUNTER — Emergency Department (HOSPITAL_COMMUNITY): Payer: Medicare PPO

## 2022-02-07 DIAGNOSIS — R410 Disorientation, unspecified: Secondary | ICD-10-CM | POA: Diagnosis not present

## 2022-02-07 DIAGNOSIS — R443 Hallucinations, unspecified: Secondary | ICD-10-CM | POA: Diagnosis present

## 2022-02-07 LAB — URINALYSIS, ROUTINE W REFLEX MICROSCOPIC
Bilirubin Urine: NEGATIVE
Glucose, UA: NEGATIVE mg/dL
Hgb urine dipstick: NEGATIVE
Ketones, ur: 5 mg/dL — AB
Leukocytes,Ua: NEGATIVE
Nitrite: NEGATIVE
Protein, ur: NEGATIVE mg/dL
Specific Gravity, Urine: 1.019 (ref 1.005–1.030)
pH: 5 (ref 5.0–8.0)

## 2022-02-07 LAB — CBC
HCT: 38.7 % (ref 36.0–46.0)
Hemoglobin: 12.8 g/dL (ref 12.0–15.0)
MCH: 31 pg (ref 26.0–34.0)
MCHC: 33.1 g/dL (ref 30.0–36.0)
MCV: 93.7 fL (ref 80.0–100.0)
Platelets: 279 10*3/uL (ref 150–400)
RBC: 4.13 MIL/uL (ref 3.87–5.11)
RDW: 13.2 % (ref 11.5–15.5)
WBC: 7.6 10*3/uL (ref 4.0–10.5)
nRBC: 0 % (ref 0.0–0.2)

## 2022-02-07 LAB — COMPREHENSIVE METABOLIC PANEL
ALT: 13 U/L (ref 0–44)
AST: 17 U/L (ref 15–41)
Albumin: 3.5 g/dL (ref 3.5–5.0)
Alkaline Phosphatase: 77 U/L (ref 38–126)
Anion gap: 8 (ref 5–15)
BUN: 25 mg/dL — ABNORMAL HIGH (ref 8–23)
CO2: 26 mmol/L (ref 22–32)
Calcium: 10.3 mg/dL (ref 8.9–10.3)
Chloride: 105 mmol/L (ref 98–111)
Creatinine, Ser: 0.7 mg/dL (ref 0.44–1.00)
GFR, Estimated: >60 mL/min (ref >60)
Glucose, Bld: 90 mg/dL (ref 70–99)
Potassium: 4.1 mmol/L (ref 3.5–5.1)
Sodium: 139 mmol/L (ref 135–145)
Total Bilirubin: 0.7 mg/dL (ref 0.3–1.2)
Total Protein: 6.7 g/dL (ref 6.5–8.1)

## 2022-02-07 LAB — MAGNESIUM: Magnesium: 2 mg/dL (ref 1.7–2.4)

## 2022-02-07 MED ORDER — SODIUM CHLORIDE 0.9 % IV BOLUS
1000.0000 mL | Freq: Once | INTRAVENOUS | Status: AC
Start: 2022-02-07 — End: 2022-02-07
  Administered 2022-02-07: 1000 mL via INTRAVENOUS

## 2022-02-07 NOTE — Discharge Instructions (Signed)
Please eat and drink as well as you can for the next 48 hours.  Please follow-up with your family doctor in the office. ?

## 2022-02-07 NOTE — ED Notes (Signed)
Ambulatory to bathroom with assistance. Gait unsteady. Patient anxious and confused.  ?

## 2022-02-07 NOTE — ED Provider Notes (Signed)
?[facility] EMERGENCY DEPARTMENT ?Provider Note ? ? ?CSN: [id number] ?Arrival date & time: 02/07/22  1258 ? ?  ? ?History ? ?Chief Complaint  ?Patient presents with  ? Altered Mental Status  ? sundowning  ? ? ?[name] is a 71 y.o. female. ? ?71 yo F with a chief complaints of hallucinations.  The husband states that she has been seeing multiple women at the house that are not there.  She also has been confused at times and has been having difficulty sleeping.  She does not eat much at baseline but has been trying to eat.  Has not been abstaining from eating but has lost some weight recently.  She denies one-sided numbness weakness denies difficulty speech or swallowing.  She tried Seroquel for insomnia for 3 days but it was working so stopped it.  She denies any other new medications.  This all started after having a lumbar spine fusion in February.  She has seen her family doctor's office multiple times for this without obvious improvement.  She had been seen a couple weeks ago and had laboratory evaluation including a thiamine and B12 level and TSH and an MRI of the brain.  She has a follow-up with the neurologist in the office on Thursday. ? ? ?Altered Mental Status ? ?  ? ?Home Medications ?Prior to Admission medications   ?Medication Sig Start Date End Date Taking? Authorizing Provider  ?acetaminophen (TYLENOL) 500 MG tablet Take by mouth.    [provider]  ?BIOTIN PO Take 100 mg by mouth 3 (three) times daily.    [provider]  ?Cholecalciferol (VITAMIN D PO) Take 1 capsule by mouth daily.     [provider]  ?Coenzyme Q10 (COQ10 PO) Take by mouth daily.    [provider]  ?ezetimibe (ZETIA) 10 MG tablet Take 10 mg by mouth daily.    [provider]  ?flecainide (TAMBOCOR) 50 MG tablet TAKE 1 TABLET BY MOUTH TWICE A DAY 09/08/21   Marinus Maw, MD  ?FORTEO 600 MCG/2.4ML SOPN Inject 20 mcg into the skin daily. 11/29/21   Fuller Plan, MD  ?Insulin Pen Needle 31G X 5 MM MISC Use to inject Forteo once daily. ?Patient not taking: Reported on 01/18/2022 01/08/21   Fuller Plan, MD  ?ipratropium (ATROVENT) 0.06 % nasal spray Place 2 sprays into both nostrils 4 (four) times daily. ?Patient not taking: Reported on 01/18/2022 07/11/21   [provider]  ?LORazepam (ATIVAN) 0.5 MG tablet Take 0.5 mg by mouth daily as needed for anxiety. ?Patient not taking: Reported on 01/18/2022    [provider]  ?meloxicam (MOBIC) 15 MG tablet Take 7.5 mg by mouth 2 (two) times daily.    [provider]  ?methylphenidate 18 MG PO CR tablet Take 18 mg by mouth as needed. 11/26/19   [provider]  ?Omega-3 Fatty Acids (FISH OIL) 1200 MG CAPS Take 3 capsules by mouth 2 (two) times daily.     [provider]  ?PARoxetine (PAXIL-CR) 25 MG 24 hr tablet TAKE 1 TABLET BY MOUTH EVERY DAY IN THE MORNING 05/13/19   [provider]  ?propranolol ER (INDERAL LA) 60 MG 24 hr capsule Take 1 capsule (60 mg total) by mouth daily. 02/18/21   Marinus Maw, MD  ?Vibegron (GEMTESA) 75 MG TABS Take 75 mg by mouth daily. ?Patient not taking: Reported on 01/18/2022 07/16/21   Michiel Cowboy A, PA-C  ?   ? ?  Allergies    ?Statins, Celebrex [celecoxib], Red yeast rice [cholestin], Codeine, Monascus purpureus went yeast, Tramadol, Zocor [simvastatin], Achromycin [tetracycline], Codeine sulfate, Colesevelam, and Welchol [colesevelam hcl]   ? ?Review of Systems   ?Review of Systems ? ?Physical Exam ?Updated Vital Signs ?BP (!) 159/76   Pulse 71   Temp 98.3 ?F (36.8 ?C) (Oral)   Resp 17   Ht 5\' 5"  (1.651 m)   Wt 47.6 kg   LMP  (LMP Unknown)   SpO2 100%   BMI 17.47 kg/m?  ?Physical Exam ?Vitals and nursing note reviewed.  ?Constitutional:   ?   General: She is not in acute distress. ?   Appearance: She is well-developed. She is not diaphoretic.  ?HENT:  ?   Head: Normocephalic and atraumatic.  ?Eyes:  ?   Pupils: Pupils  are equal, round, and reactive to light.  ?Cardiovascular:  ?   Rate and Rhythm: Normal rate and regular rhythm.  ?   Heart sounds: No murmur heard. ?  No friction rub. No gallop.  ?Pulmonary:  ?   Effort: Pulmonary effort is normal.  ?   Breath sounds: No wheezing or rales.  ?Abdominal:  ?   General: There is no distension.  ?   Palpations: Abdomen is soft.  ?   Tenderness: There is no abdominal tenderness.  ?Musculoskeletal:     ?   General: No tenderness.  ?   Cervical back: Normal range of motion and neck supple.  ?Skin: ?   General: Skin is warm and dry.  ?Neurological:  ?   Mental Status: She is alert and oriented to person, place, and time.  ?Psychiatric:     ?   Behavior: Behavior normal.  ? ? ?ED Results / Procedures / Treatments   ?Labs ?(all labs ordered are listed, but only abnormal results are displayed) ?Labs Reviewed  ?COMPREHENSIVE METABOLIC PANEL - Abnormal; Notable for the following components:  ?    Result Value  ? BUN 25 (*)   ? All other components within normal limits  ?URINALYSIS, ROUTINE W REFLEX MICROSCOPIC - Abnormal; Notable for the following components:  ? Ketones, ur 5 (*)   ? All other components within normal limits  ?CBC  ?MAGNESIUM  ? ? ?EKG ?None ? ?Radiology ?DG Chest Port 1 View ? ?Result Date: 02/07/2022 ?CLINICAL DATA:  Altered mental status. EXAM: PORTABLE CHEST 1 VIEW COMPARISON:  August 15, 2017. FINDINGS: The heart size and mediastinal contours are within normal limits. Both lungs are clear. The visualized skeletal structures are unremarkable. IMPRESSION: No active disease. Electronically Signed   By: August 17, 2017 M.D.   On: 02/07/2022 17:45   ? ?Procedures ?Procedures  ? ? ?Medications Ordered in ED ?Medications  ?sodium chloride 0.9 % bolus 1,000 mL (1,000 mLs Intravenous New Bag/Given 02/07/22 1825)  ? ? ?ED Course/ Medical Decision Making/ A&P ?  ?                        ?Medical Decision Making ?Amount and/or Complexity of Data Reviewed ?Labs: ordered. ?Radiology:  ordered. ? ? ?71 yo F with a chief complaints of hallucinations.  The husband states that she has been seeing multiple women at the house that are not there.  She also has been confused at times and has been having difficulty sleeping.  She does not eat much at baseline but has been trying to eat.  Has not been abstaining from eating but has  lost some weight recently.  She denies one-sided numbness weakness denies difficulty speech or swallowing.  She tried Seroquel for insomnia for 3 days but it was working so stopped it.  She denies any other new medications.  This all started after having a lumbar spine fusion in February.  She has seen her family doctor's office multiple times for this without obvious improvement.  She had been seen a couple weeks ago and had laboratory evaluation including a thiamine and B12 level and TSH and an MRI of the brain.  She has a follow-up with the neurologist in the office on Thursday. ? ?I reviewed the patient's medical record.  I reviewed the lab work from the doctor's office on April 5.  She had an MRI that was negative, no signs of MS flare.  She had a TSH that was normal.  A thiamine level that was normal. ? ?I think would be very low yield but will repeat lab work here to assess for some abnormality.  Chest x-ray and UA to assess for infection.  Bolus of IV fluids for possible dehydration. ? ?Work-up here is fairly consistent with previous.  No obvious electrolyte abnormality no anemia chest x-ray independently interpreted by me without focal infiltrate.  UA without infection. ? ?Discussed results with patient and family.  We will have them follow-up with their PCP.  We will have them follow-up with her neurologist. ? ?8:03 PM:  I have discussed the diagnosis/risks/treatment options with the patient and family.  Evaluation and diagnostic testing in the emergency department does not suggest an emergent condition requiring admission or immediate intervention beyond what has been  performed at this time.  They will follow up with  PCP. We also discussed returning to the ED immediately if new or worsening sx occur. We discussed the sx which are most concerning (e.g., sudden worsening pain, fever, inab

## 2022-02-07 NOTE — ED Triage Notes (Signed)
Pt/husband stated, I had back surgery in Feburary on my back, and ever since [place] been promisingly  worse. Im not able to slee, Im confused. The anesthesiologist thinks it might be from the anesthetics. The said I have sundowning, Im confused. He fused 3 vertebra. I have an appt on Thursday with a neurologist but I cant wait that long.. .  ?

## 2022-02-07 NOTE — ED Provider Triage Note (Signed)
Emergency Medicine Provider Triage Evaluation Note ? ?[name] , a 71 y.o. female  was evaluated in triage.  Pt complains of inability to concentrate, "sundowners".  Patient reports that back in February she had back surgery and was anesthetized for this event.  The patient and her husband state that since this event she has had increased confusion, altered mental status.  Patient has been diagnosed with sundowners.  Patient also has a diagnosis of MS.  Patient is set to see neurologist at [facility] on Thursday however the patient states that she cannot wait that long.  Patient is alert and oriented x2 on examination. ? ?Review of Systems  ?Positive:  ?Negative:  ? ?Physical Exam  ?BP 120/88 (BP Location: Right Arm)   Pulse 70   Temp 98.3 ?F (36.8 ?C) (Oral)   Resp 17   LMP  (LMP Unknown)   SpO2 96%  ?Gen:   Awake, no distress   ?Resp:  Normal effort  ?MSK:   Moves extremities without difficulty  ?Other:   ? ?Medical Decision Making  ?Medically screening exam initiated at 2:22 PM.  Appropriate orders placed.  [name] was informed that the remainder of the evaluation will be completed by another provider, this initial triage assessment does not replace that evaluation, and the importance of remaining in the ED until their evaluation is complete. ? ? ?  ?Al Decant, PA-C ?02/07/22 1423 ? ?

## 2022-02-08 ENCOUNTER — Ambulatory Visit: Admit: 2022-02-08 | Payer: Medicare PPO | Admitting: Ophthalmology

## 2022-02-08 SURGERY — PHACOEMULSIFICATION, CATARACT, WITH IOL INSERTION
Anesthesia: Topical | Laterality: Left
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
## 2022-02-22 ENCOUNTER — Ambulatory Visit: Admit: 2022-02-22 | Payer: Medicare PPO | Admitting: Ophthalmology

## 2022-02-22 SURGERY — PHACOEMULSIFICATION, CATARACT, WITH IOL INSERTION
Anesthesia: Topical | Laterality: Right

## 2022-02-23 ENCOUNTER — Telehealth: Payer: Self-pay | Admitting: Student

## 2022-02-23 NOTE — Telephone Encounter (Signed)
Attempted to contact patient to offer to schedule a Palliative Consult, no answer - left message with reason for call along with my name and call back number, requesting a return call to schedule visit. ?

## 2022-02-28 ENCOUNTER — Encounter: Payer: Self-pay | Admitting: Internal Medicine

## 2022-02-28 ENCOUNTER — Ambulatory Visit: Payer: Medicare PPO | Admitting: Internal Medicine

## 2022-02-28 VITALS — BP 135/81 | HR 60 | Ht 66.0 in | Wt 103.0 lb

## 2022-02-28 DIAGNOSIS — E559 Vitamin D deficiency, unspecified: Secondary | ICD-10-CM

## 2022-02-28 DIAGNOSIS — M810 Age-related osteoporosis without current pathological fracture: Secondary | ICD-10-CM | POA: Diagnosis not present

## 2022-02-28 DIAGNOSIS — G35 Multiple sclerosis: Secondary | ICD-10-CM | POA: Diagnosis not present

## 2022-02-28 DIAGNOSIS — G6289 Other specified polyneuropathies: Secondary | ICD-10-CM

## 2022-02-28 DIAGNOSIS — R296 Repeated falls: Secondary | ICD-10-CM

## 2022-03-20 ENCOUNTER — Other Ambulatory Visit: Payer: Self-pay | Admitting: Internal Medicine

## 2022-03-22 ENCOUNTER — Telehealth: Payer: Self-pay | Admitting: Student

## 2022-03-22 NOTE — Telephone Encounter (Signed)
Spoke with patient's husband Annette Stable and daughter Melina Schools regarding the Palliative referral/services and all questions were answered and verbal consent was rec'd from husband to begin Palliative services.  I have scheduled a MyChart Palliative Consult for 03/29/22 @ 10 AM.

## 2022-03-25 ENCOUNTER — Other Ambulatory Visit: Payer: Self-pay | Admitting: Family Medicine

## 2022-03-25 DIAGNOSIS — Z1231 Encounter for screening mammogram for malignant neoplasm of breast: Secondary | ICD-10-CM

## 2022-03-29 ENCOUNTER — Telehealth: Payer: Medicare PPO | Admitting: Student

## 2022-03-29 DIAGNOSIS — Z515 Encounter for palliative care: Secondary | ICD-10-CM

## 2022-03-29 DIAGNOSIS — R410 Disorientation, unspecified: Secondary | ICD-10-CM

## 2022-03-29 DIAGNOSIS — R52 Pain, unspecified: Secondary | ICD-10-CM

## 2022-03-29 NOTE — Progress Notes (Signed)
Therapist, nutritional Palliative Care Consult Note Telephone: [phone]  Fax: [phone]   Date of encounter: 03/29/22 10:18 AM PATIENT NAME: [name] [address] [place] [address]   [phone] (home)  DOB: November 22, 1950 MRN: [id number] PRIMARY CARE PROVIDER:    Lupita Raider, MD,  [address] [place] [address] [phone]  REFERRING PROVIDER:   Lupita Raider, MD [address] [place],  [place] [address] [phone]  RESPONSIBLE PARTY:    Contact Information     Name Relation Home Work Mobile   [name] Spouse   [phone]   [name] Daughter [phone]     [name] [phone]          Due to the COVID-19 crisis, this visit was done via telemedicine from my office and it was initiated and consent by this patient and or family.  I connected with  [name] OR PROXY on 03/29/22 by a via telephone and verified that I am speaking with the correct person using two identifiers. Patient was unable to connect via Mychart video, therefore visit was completed via telephone.    I discussed the limitations of evaluation and management by telemedicine. The patient expressed understanding and agreed to proceed.                                    ASSESSMENT AND PLAN / RECOMMENDATIONS:   Advance Care Planning/Goals of Care: Goals include to maximize quality of life and symptom management. Patient/health care surrogate gave his/her permission to discuss.Our advance care planning conversation included a discussion about:    The value and importance of advance care planning  Experiences with loved ones who have been seriously ill or have died  Exploration of personal, cultural or spiritual beliefs that might influence medical decisions  Exploration of goals of care in the event of a sudden injury or illness  Daughter [name] is HCPOA  Introduced MOST form CODE STATUS: Full Code; this  will be an ongoing discussion.   Education provided on Palliative Medicine. Palliative medicine will continue to provide supportive care, symptom management as needed. Patient would like to remain in the home for as long as possible with ongoing family support. Family to discuss MOST form; will complete on next visit.   Symptom Management/Plan:  Pain-patient with pain to right leg and hip since surgery, chronic back pain. Recommend adding gabapentin 100 mg QHS, continue acetaminophen 500 mg 1-2 tablets TID, alternating with ibuprofen PRN; she is currently taking one tablet. Continue meloxicam as directed. Recommend ice for up to 20 minutes TID PRN.   Delirium-post op. Patient had been exhibiting hallucinations, increased confusion, increased difficulty caring for her in the home. This is improving per family. She was recently started on olanzapine 2.5 mg BID; takes occasionally. Recommend taking olanzapine 2.5 mg daily. Patient to follow up with neurology as scheduled.  Follow up Palliative Care Visit: Palliative care will continue to follow for complex medical decision making, advance care planning, and clarification of goals. Return in 4-6 weeks or prn.  I spent 40 minutes providing this consultation. More than 50% of the time in this consultation was spent in counseling and care coordination.    HOSPICE ELIGIBILITY/DIAGNOSIS: TBD  Chief Complaint: Palliative Medicine initial   HISTORY OF PRESENT ILLNESS:  [name] is a 71 y.o. year old female  with MS, major depressive  disorder, anxiety, GERD, hypertension, hyperlipidemia, DDD, IBS, cervical spondylosis, osteoporosis.  Patient resides at home with husband.  She endorses good family support.  She states that her weakness has improved; completed therapy.  She is able to perform ADLs although she states husband has been does majority of household chores. Patient is no longer driving.  She states her appetite has started to improve. She is  sleeping well most nights. She does endorse pain in right leg and hip, chronic back pain.  She denies shortness of breath, nausea; occasional constipation.  Daughter endorses patient having hallucinations, postop delirium.  Daughter [name] states that this has improved. Patient was started on olanzapine 2.5 mg twice daily as needed. She is currently taking occasionally and will let husband know when she needs medication. A 10 point review of systems is negative, except for the pertinent positives and negatives detailed per the HPI.   History obtained from review of EMR, discussion with primary team, and interview with family, facility staff/caregiver and/or [name].  I reviewed available labs, medications, imaging, studies and related documents from the EMR.  Records reviewed and summarized above.   Physical Exam: PE deferred d/t this being a telemedicine visit.  CURRENT PROBLEM LIST:  Patient Active Problem List   Diagnosis Date Noted   Premature atrial contractions 02/10/2021   Osteoporosis 12/30/2020   Vitamin D deficiency 12/30/2020   Frequent falls 12/30/2020   Abnormal EKG 04/13/2016   Chest pain, unspecified 04/13/2016   Family history of heart disease 04/13/2016   Heart palpitations 04/13/2016   Palpitations 02/17/2016   Diplopia 07/12/2015   Multiple sclerosis exacerbation (HCC) 07/11/2015   Small intestinal bacterial overgrowth 07/11/2015   Peripheral neuropathy (HCC) 07/11/2015   HLD (hyperlipidemia) 07/11/2015   Relapsing remitting multiple sclerosis (HCC) 04/28/2015   Multiple sclerosis (HCC) 02/21/2013   PAST MEDICAL HISTORY:  Active Ambulatory Problems    Diagnosis Date Noted   Multiple sclerosis (HCC) 02/21/2013   Relapsing remitting multiple sclerosis (HCC) 04/28/2015   Multiple sclerosis exacerbation (HCC) 07/11/2015   Small intestinal bacterial overgrowth 07/11/2015   Peripheral neuropathy (HCC) 07/11/2015   HLD (hyperlipidemia) 07/11/2015   Diplopia  07/12/2015   Palpitations 02/17/2016   Abnormal EKG 04/13/2016   Chest pain, unspecified 04/13/2016   Family history of heart disease 04/13/2016   Heart palpitations 04/13/2016   Osteoporosis 12/30/2020   Vitamin D deficiency 12/30/2020   Frequent falls 12/30/2020   Premature atrial contractions 02/10/2021   Resolved Ambulatory Problems    Diagnosis Date Noted   No Resolved Ambulatory Problems   Past Medical History:  Diagnosis Date   Anxiety    Cranial nerve palsy    DDD (degenerative disc disease), thoracolumbar    GERD (gastroesophageal reflux disease)    IBS (irritable bowel syndrome)    SOCIAL HX:  Social History   Tobacco Use   Smoking status: Never    Passive exposure: Never   Smokeless tobacco: Never  Substance Use Topics   Alcohol use: Not Currently    Comment: occasionally 1 glass of wine   FAMILY HX:  Family History  Problem Relation Age of Onset   Alzheimer's disease Mother    Hyperlipidemia Mother    Bladder Cancer Mother        bladder polyps frozen off a few times was told it was cancerous   Sjogren's syndrome Mother    CAD Father        CABG x5 @ 38; cause of death = clostridium   Irritable  bowel syndrome Father    Endometriosis Daughter    Endometriosis Daughter    Heart attack Maternal Grandfather    Heart attack Paternal Grandfather    Emphysema Brother    Kidney cancer Neg Hx    Prostate cancer Neg Hx    Breast cancer Neg Hx       ALLERGIES:  Allergies  Allergen Reactions   Statins Anaphylaxis   Celebrex [Celecoxib] Itching and Swelling    Throat swelling   Red Yeast Rice [Cholestin] Itching and Swelling    Throat swelling   Codeine Nausea And Vomiting   Monascus Purpureus Went Yeast Other (See Comments)   Tramadol     LACK OF EFFECT   Zocor [Simvastatin] Itching and Swelling    Throat swelling   Achromycin [Tetracycline] Hives   Codeine Sulfate Nausea And Vomiting   Colesevelam Nausea And Vomiting   Welchol [Colesevelam Hcl]  Nausea And Vomiting     PERTINENT MEDICATIONS:  Outpatient Encounter Medications as of 03/29/2022  Medication Sig   acetaminophen (TYLENOL) 500 MG tablet Take by mouth as needed.   BIOTIN PO Take 100 mg by mouth 3 (three) times daily.   Cholecalciferol (VITAMIN D PO) Take 1 capsule by mouth daily.    Coenzyme Q10 (COQ10 PO) Take by mouth daily.   ezetimibe (ZETIA) 10 MG tablet Take 10 mg by mouth daily.   flecainide (TAMBOCOR) 50 MG tablet TAKE 1 TABLET BY MOUTH TWICE A DAY   FORTEO 600 MCG/2.4ML SOPN Inject 20 mcg into the skin daily.   IBUPROFEN PO Take by mouth daily as needed.   Insulin Pen Needle 31G X 5 MM MISC Use to inject Forteo once daily.   ipratropium (ATROVENT) 0.06 % nasal spray Place 2 sprays into both nostrils 4 (four) times daily. (Patient not taking: Reported on 01/18/2022)   LORazepam (ATIVAN) 0.5 MG tablet Take 0.5 mg by mouth daily as needed for anxiety.   meloxicam (MOBIC) 15 MG tablet Take 7.5 mg by mouth 2 (two) times daily.   methylphenidate 18 MG PO CR tablet Take 18 mg by mouth as needed. (Patient not taking: Reported on 02/28/2022)   Omega-3 Fatty Acids (FISH OIL) 1200 MG CAPS Take 3 capsules by mouth 2 (two) times daily.    PARoxetine (PAXIL-CR) 25 MG 24 hr tablet TAKE 1 TABLET BY MOUTH EVERY DAY IN THE MORNING   propranolol ER (INDERAL LA) 60 MG 24 hr capsule Take 1 capsule (60 mg total) by mouth daily.   Vibegron (GEMTESA) 75 MG TABS Take 75 mg by mouth daily. (Patient not taking: Reported on 01/18/2022)   No facility-administered encounter medications on file as of 03/29/2022.   Thank you for the opportunity to participate in the care of Ms. Marcantel.  The palliative care team will continue to follow. Please call our office at [phone] if we can be of additional assistance.   Luella Cook, NP   COVID-19 PATIENT SCREENING TOOL Asked and negative response unless otherwise noted:  Have you had symptoms of covid, tested positive or been in contact with  someone with symptoms/positive test in the past 5-10 days? No

## 2022-04-05 ENCOUNTER — Other Ambulatory Visit: Payer: Self-pay | Admitting: Internal Medicine

## 2022-04-25 ENCOUNTER — Ambulatory Visit
Admission: RE | Admit: 2022-04-25 | Discharge: 2022-04-25 | Disposition: A | Discharge status: Home / Self Care | Payer: Medicare PPO | Source: Ambulatory Visit | Attending: Family Medicine | Admitting: Family Medicine

## 2022-04-25 DIAGNOSIS — Z1231 Encounter for screening mammogram for malignant neoplasm of breast: Secondary | ICD-10-CM

## 2022-05-05 ENCOUNTER — Other Ambulatory Visit: Payer: No Typology Code available for payment source | Admitting: Student

## 2022-05-11 DIAGNOSIS — M47816 Spondylosis without myelopathy or radiculopathy, lumbar region: Secondary | ICD-10-CM | POA: Diagnosis not present

## 2022-05-11 DIAGNOSIS — M1611 Unilateral primary osteoarthritis, right hip: Secondary | ICD-10-CM | POA: Diagnosis not present

## 2022-05-12 DIAGNOSIS — H2512 Age-related nuclear cataract, left eye: Secondary | ICD-10-CM | POA: Diagnosis not present

## 2022-05-16 ENCOUNTER — Encounter: Payer: Self-pay | Admitting: Ophthalmology

## 2022-05-16 DIAGNOSIS — M4326 Fusion of spine, lumbar region: Secondary | ICD-10-CM | POA: Diagnosis not present

## 2022-05-16 DIAGNOSIS — R2681 Unsteadiness on feet: Secondary | ICD-10-CM | POA: Diagnosis not present

## 2022-05-17 DIAGNOSIS — D485 Neoplasm of uncertain behavior of skin: Secondary | ICD-10-CM | POA: Diagnosis not present

## 2022-05-17 DIAGNOSIS — C44519 Basal cell carcinoma of skin of other part of trunk: Secondary | ICD-10-CM | POA: Diagnosis not present

## 2022-05-17 DIAGNOSIS — Z872 Personal history of diseases of the skin and subcutaneous tissue: Secondary | ICD-10-CM | POA: Diagnosis not present

## 2022-05-17 DIAGNOSIS — L738 Other specified follicular disorders: Secondary | ICD-10-CM | POA: Diagnosis not present

## 2022-05-17 DIAGNOSIS — L578 Other skin changes due to chronic exposure to nonionizing radiation: Secondary | ICD-10-CM | POA: Diagnosis not present

## 2022-05-18 NOTE — Discharge Instructions (Signed)

## 2022-05-19 ENCOUNTER — Other Ambulatory Visit: Payer: Self-pay | Admitting: Internal Medicine

## 2022-05-19 ENCOUNTER — Other Ambulatory Visit: Payer: Medicare PPO | Admitting: Student

## 2022-05-19 DIAGNOSIS — M4326 Fusion of spine, lumbar region: Secondary | ICD-10-CM | POA: Diagnosis not present

## 2022-05-19 DIAGNOSIS — R2681 Unsteadiness on feet: Secondary | ICD-10-CM | POA: Diagnosis not present

## 2022-05-19 DIAGNOSIS — R52 Pain, unspecified: Secondary | ICD-10-CM

## 2022-05-19 DIAGNOSIS — Z515 Encounter for palliative care: Secondary | ICD-10-CM

## 2022-05-19 DIAGNOSIS — M810 Age-related osteoporosis without current pathological fracture: Secondary | ICD-10-CM

## 2022-05-19 DIAGNOSIS — R443 Hallucinations, unspecified: Secondary | ICD-10-CM

## 2022-05-19 NOTE — Telephone Encounter (Signed)
Next Visit: 08/24/2022  Last Visit: 02/28/2022  Last Fill: 11/29/2021  DX: Age-related osteoporosis without current pathological fracture  Current Dose per office note 11/29/2021: Forteo Inject 20 mcg into skin daily.  Labs: 02/07/2022  CBC WNL  CMP WNL except BUN 25 HIGH  Okay to refill Forteo?

## 2022-05-19 NOTE — Progress Notes (Signed)
[facility] Consult Note Telephone: [phone]  Fax: [phone]    Date of encounter: 05/19/22 1:37 PM PATIENT NAME: [name] [id number]   [phone] (home)  DOB: 12/28/1950 MRN: [id number] PRIMARY CARE PROVIDER:    Mayra Neer, MD,  [place] [place] [address] [place] [address] [phone]  REFERRING PROVIDER:   Mayra Neer, MD [address] [place],  [place] [address] [phone]  RESPONSIBLE PARTY:    Contact Information     Name Relation Home Work Mobile   [name] Spouse   [place] Daughter [phone]     [name] [id number]          I met face to face with patient and family in the home. Palliative Care was asked to follow this patient by consultation request of  Amy Neer, MD to address advance care planning and complex medical decision making. This is a follow up visit.                                   ASSESSMENT AND PLAN / RECOMMENDATIONS:   Advance Care Planning/Goals of Care: Goals include to maximize quality of life and symptom management. Patient/health care surrogate gave his/her permission to discuss. Our advance care planning conversation included a discussion about:    The value and importance of advance care planning  Experiences with loved ones who have been seriously ill or have died  Exploration of personal, cultural or spiritual beliefs that might influence medical decisions  Exploration of goals of care in the event of a sudden injury or illness  Husband is 41, daughter Museum/gallery conservator secondary CODE STATUS: Full Code   Education provided on Palliative Medicine. Will continue to provide supportive care, symptom management as needed. Patient and family recently completed Advanced directives. She would want CPR, feeding tube as needed.   Symptom Management/Plan:  Pain-c/o back, right hip pain.  Currently taking acetaminophen alternating with ibuprofen PRN. Continue meloxicam, lyrica daily.Recommend ice pack for 20 minutes TID PRN. Patient has restarted physical therapy with [name]. Patient would like to prevent having to have surgery again.   Hallucinations-occasional hallucinations. Continue olanzapine 2.5 mg BID PRN.  Follow up Palliative Care Visit: Palliative care will continue to follow for complex medical decision making, advance care planning, and clarification of goals. Return in 8-12 weeks or prn. Check in via telephone in 3 months.  I spent 45 minutes providing this consultation. More than 50% of the time in this consultation was spent in counseling and care coordination.   PPS: 60%  HOSPICE ELIGIBILITY/DIAGNOSIS: TBD  Chief Complaint: Palliative Medicine follow up visit.   HISTORY OF PRESENT ILLNESS:  [name] is a 71 y.o. year old female  with MS, major depressive disorder, anxiety, GERD, hypertension, hyperlipidemia, DDD, IBS, cervical spondylosis, osteoporosis.  Patient reports doing well. Good family support. She endorses c/o right hip/back pain. She completed HH therapy but reports increased weakness recently. She has started PT with [name] physical therapy. Her appetite has improved; actually gaining weight. She is eating foods she enjoys.  She occassionally hallucinates; taking olanzapine 1-2 times daily. No recent falls or injury. Endorses stable mood.   Patient received in her living room. She is ambulatory without assistive device. She has pleasant affect. She does have some forgetfulness noted.   History obtained from review of  EMR, discussion with primary team, and interview with family, facility staff/caregiver and/or [name].  I reviewed available labs, medications, imaging, studies and related documents from the EMR.  Records reviewed and summarized above.   ROS  10 point review of systems is negative, except for the pertinent  positives and negatives detailed per the HPI.  Physical Exam: Constitutional: NAD General: frail appearing, thin EYES: anicteric sclera, lids intact, no discharge  ENMT: intact hearing, oral mucous membranes moist, dentition intact CV: S1S2, RRR, no LE edema Pulmonary: LCTA, no increased work of breathing, no cough, room air Abdomen: normo-active BS + 4 quadrants, soft and non tender, no ascites GU: deferred MSK: moves all extremities, ambulatory Skin: warm and dry, no rashes or wounds on visible skin Neuro: A & O x 3, forgetful   Psych: non-anxious affect, pleasant Hem/lymph/immuno: no widespread bruising   Thank you for the opportunity to participate in the care of [name].  The palliative care team will continue to follow. Please call our office at [phone] if we can be of additional assistance.   Amy Slocumb, NP   COVID-19 PATIENT SCREENING TOOL Asked and negative response unless otherwise noted:   Have you had symptoms of covid, tested positive or been in contact with someone with symptoms/positive test in the past 5-10 days? No
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
## 2022-05-23 DIAGNOSIS — F29 Unspecified psychosis not due to a substance or known physiological condition: Secondary | ICD-10-CM | POA: Diagnosis not present

## 2022-05-23 DIAGNOSIS — F05 Delirium due to known physiological condition: Secondary | ICD-10-CM | POA: Diagnosis not present

## 2022-05-23 DIAGNOSIS — F411 Generalized anxiety disorder: Secondary | ICD-10-CM | POA: Diagnosis not present

## 2022-05-23 DIAGNOSIS — R32 Unspecified urinary incontinence: Secondary | ICD-10-CM | POA: Diagnosis not present

## 2022-05-23 DIAGNOSIS — G35 Multiple sclerosis: Secondary | ICD-10-CM | POA: Diagnosis not present

## 2022-05-23 DIAGNOSIS — H269 Unspecified cataract: Secondary | ICD-10-CM | POA: Diagnosis not present

## 2022-05-23 DIAGNOSIS — M48061 Spinal stenosis, lumbar region without neurogenic claudication: Secondary | ICD-10-CM | POA: Diagnosis not present

## 2022-05-24 ENCOUNTER — Encounter
Admission: RE | Disposition: A | Discharge status: Home / Self Care | Payer: Self-pay | Source: Home / Self Care | Attending: Ophthalmology

## 2022-05-24 ENCOUNTER — Encounter: Payer: Self-pay | Admitting: Ophthalmology

## 2022-05-24 ENCOUNTER — Ambulatory Visit (AMBULATORY_SURGERY_CENTER): Payer: Medicare PPO | Admitting: Anesthesiology

## 2022-05-24 ENCOUNTER — Ambulatory Visit
Admission: RE | Admit: 2022-05-24 | Discharge: 2022-05-24 | Disposition: A | Discharge status: Home / Self Care | Payer: Medicare PPO | Attending: Ophthalmology | Admitting: Ophthalmology

## 2022-05-24 ENCOUNTER — Ambulatory Visit: Payer: Medicare PPO | Admitting: Anesthesiology

## 2022-05-24 ENCOUNTER — Other Ambulatory Visit: Payer: Self-pay

## 2022-05-24 DIAGNOSIS — M1990 Unspecified osteoarthritis, unspecified site: Secondary | ICD-10-CM | POA: Diagnosis not present

## 2022-05-24 DIAGNOSIS — H4922 Sixth [abducent] nerve palsy, left eye: Secondary | ICD-10-CM | POA: Insufficient documentation

## 2022-05-24 DIAGNOSIS — G35 Multiple sclerosis: Secondary | ICD-10-CM | POA: Diagnosis not present

## 2022-05-24 DIAGNOSIS — F0390 Unspecified dementia without behavioral disturbance: Secondary | ICD-10-CM | POA: Insufficient documentation

## 2022-05-24 DIAGNOSIS — M47812 Spondylosis without myelopathy or radiculopathy, cervical region: Secondary | ICD-10-CM | POA: Diagnosis not present

## 2022-05-24 DIAGNOSIS — H2512 Age-related nuclear cataract, left eye: Secondary | ICD-10-CM | POA: Diagnosis not present

## 2022-05-24 DIAGNOSIS — M25551 Pain in right hip: Secondary | ICD-10-CM | POA: Diagnosis not present

## 2022-05-24 DIAGNOSIS — M549 Dorsalgia, unspecified: Secondary | ICD-10-CM | POA: Insufficient documentation

## 2022-05-24 DIAGNOSIS — F419 Anxiety disorder, unspecified: Secondary | ICD-10-CM | POA: Insufficient documentation

## 2022-05-24 HISTORY — DX: Unspecified dementia, unspecified severity, without behavioral disturbance, psychotic disturbance, mood disturbance, and anxiety: F03.90

## 2022-05-24 HISTORY — PX: CATARACT EXTRACTION W/PHACO: SHX586

## 2022-05-24 HISTORY — DX: Palpitations: R00.2

## 2022-05-24 SURGERY — PHACOEMULSIFICATION, CATARACT, WITH IOL INSERTION
Anesthesia: Monitor Anesthesia Care | Site: Eye | Laterality: Left

## 2022-05-24 MED ORDER — SIGHTPATH DOSE#1 BSS IO SOLN
INTRAOCULAR | Status: DC | PRN
  Administered 2022-05-24: 1 mL via INTRAMUSCULAR

## 2022-05-24 MED ORDER — SIGHTPATH DOSE#1 BSS IO SOLN
INTRAOCULAR | Status: DC | PRN
  Administered 2022-05-24: 15 mL

## 2022-05-24 MED ORDER — BRIMONIDINE TARTRATE-TIMOLOL 0.2-0.5 % OP SOLN
OPHTHALMIC | Status: DC | PRN
  Administered 2022-05-24: 1 [drp] via OPHTHALMIC

## 2022-05-24 MED ORDER — SIGHTPATH DOSE#1 NA CHONDROIT SULF-NA HYALURON 40-17 MG/ML IO SOLN
INTRAOCULAR | Status: DC | PRN
  Administered 2022-05-24 (×2): 1 mL via INTRAOCULAR

## 2022-05-24 MED ORDER — SIGHTPATH DOSE#1 BSS IO SOLN
INTRAOCULAR | Status: DC | PRN
  Administered 2022-05-24: 64 mL via OPHTHALMIC

## 2022-05-24 MED ORDER — ARMC OPHTHALMIC DILATING DROPS
1.0000 | OPHTHALMIC | Status: DC | PRN
  Administered 2022-05-24 (×3): 1 via OPHTHALMIC

## 2022-05-24 MED ORDER — MIDAZOLAM HCL 2 MG/2ML IJ SOLN
INTRAMUSCULAR | Status: DC | PRN
  Administered 2022-05-24: 1 mg via INTRAVENOUS

## 2022-05-24 MED ORDER — TETRACAINE HCL 0.5 % OP SOLN
1.0000 [drp] | OPHTHALMIC | Status: DC | PRN
  Administered 2022-05-24 (×3): 1 [drp] via OPHTHALMIC

## 2022-05-24 MED ORDER — MOXIFLOXACIN HCL 0.5 % OP SOLN
OPHTHALMIC | Status: DC | PRN
  Administered 2022-05-24: 0.2 mL via OPHTHALMIC

## 2022-05-24 MED ORDER — FENTANYL CITRATE (PF) 100 MCG/2ML IJ SOLN
INTRAMUSCULAR | Status: DC | PRN
  Administered 2022-05-24: 50 ug via INTRAVENOUS

## 2022-05-24 SURGICAL SUPPLY — 10 items
CATARACT SUITE SIGHTPATH (MISCELLANEOUS) ×2 IMPLANT
FEE CATARACT SUITE SIGHTPATH (MISCELLANEOUS) ×1 IMPLANT
GLOVE SURG ENC TEXT LTX SZ8 (GLOVE) ×2 IMPLANT
GLOVE SURG TRIUMPH 8.0 PF LTX (GLOVE) ×2 IMPLANT
LENS IOL TECNIS EYHANCE 11.5 (Intraocular Lens) ×1 IMPLANT
NDL FILTER BLUNT 18X1 1/2 (NEEDLE) ×1 IMPLANT
NEEDLE FILTER BLUNT 18X 1/2SAF (NEEDLE) ×1
NEEDLE FILTER BLUNT 18X1 1/2 (NEEDLE) ×1 IMPLANT
SYR 3ML LL SCALE MARK (SYRINGE) ×2 IMPLANT
WATER STERILE IRR 250ML POUR (IV SOLUTION) ×2 IMPLANT

## 2022-05-24 NOTE — Op Note (Signed)
PREOPERATIVE DIAGNOSIS:  Nuclear sclerotic cataract of the left eye.   POSTOPERATIVE DIAGNOSIS:  Nuclear sclerotic cataract of the left eye.   OPERATIVE PROCEDURE:ORPROCALL@   SURGEON:  Galen Manila, MD.   ANESTHESIA:  Anesthesiologist: Foye Deer, MD CRNA: Sharyn Dross, CRNA  1.      Managed anesthesia care. 2.     0.55ml of Shugarcaine was instilled following the paracentesis   COMPLICATIONS:  None.   TECHNIQUE:   Stop and chop   DESCRIPTION OF PROCEDURE:  The patient was examined and consented in the preoperative holding area where the aforementioned topical anesthesia was applied to the left eye and then brought back to the Operating Room where the left eye was prepped and draped in the usual sterile ophthalmic fashion and a lid speculum was placed. A paracentesis was created with the side port blade and the anterior chamber was filled with viscoelastic. A near clear corneal incision was performed with the steel keratome. A continuous curvilinear capsulorrhexis was performed with a cystotome followed by the capsulorrhexis forceps. Hydrodissection and hydrodelineation were carried out with BSS on a blunt cannula. The lens was removed in a stop and chop  technique and the remaining cortical material was removed with the irrigation-aspiration handpiece. The capsular bag was inflated with viscoelastic and the Technis ZCB00 lens was placed in the capsular bag without complication. The remaining viscoelastic was removed from the eye with the irrigation-aspiration handpiece. The wounds were hydrated. The anterior chamber was flushed with BSS and the eye was inflated to physiologic pressure. 0.45ml Vigamox was placed in the anterior chamber. The wounds were found to be water tight. The eye was dressed with Combigan. The patient was given protective glasses to wear throughout the day and a shield with which to sleep tonight. The patient was also given drops with which to begin a drop  regimen today and will follow-up with me in one day. Implant Name Type Inv. Item Serial No. Manufacturer Lot No. LRB No. Used Action  LENS IOL TECNIS EYHANCE 11.5 - [id number] Intraocular Lens LENS IOL TECNIS EYHANCE 11.5 [id number] [employer]  Left 1 Implanted    Procedure(s) with comments: CATARACT EXTRACTION PHACO AND INTRAOCULAR LENS PLACEMENT (IOC) LEFT (Left) - 8.01 00:49.3  Electronically signed: Galen Manila 05/24/2022 11:38 AM
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
## 2022-05-24 NOTE — Anesthesia Postprocedure Evaluation (Signed)
Anesthesia Post Note  Patient: [name]  Procedure(s) Performed: CATARACT EXTRACTION PHACO AND INTRAOCULAR LENS PLACEMENT (IOC) LEFT (Left: Eye)     Patient location during evaluation: PACU Anesthesia Type: MAC Level of consciousness: awake and alert Pain management: pain level controlled Vital Signs Assessment: post-procedure vital signs reviewed and stable Respiratory status: spontaneous breathing, nonlabored ventilation and respiratory function stable Cardiovascular status: blood pressure returned to baseline and stable Postop Assessment: no apparent nausea or vomiting Anesthetic complications: no   There were no known notable events for this encounter.  [place] Ouch

## 2022-05-24 NOTE — Transfer of Care (Signed)
Immediate Anesthesia Transfer of Care Note  Patient: [name]  Procedure(s) Performed: CATARACT EXTRACTION PHACO AND INTRAOCULAR LENS PLACEMENT (IOC) LEFT (Left: Eye)  Patient Location: PACU  Anesthesia Type: MAC  Level of Consciousness: awake, alert  and patient cooperative  Airway and Oxygen Therapy: Patient Spontanous Breathing and Patient connected to supplemental oxygen  Post-op Assessment: Post-op Vital signs reviewed, Patient's Cardiovascular Status Stable, Respiratory Function Stable, Patent Airway and No signs of Nausea or vomiting  Post-op Vital Signs: Reviewed and stable  Complications: There were no known notable events for this encounter.

## 2022-05-25 ENCOUNTER — Encounter: Payer: Self-pay | Admitting: Ophthalmology

## 2022-05-25 ENCOUNTER — Other Ambulatory Visit: Payer: Self-pay

## 2022-05-30 DIAGNOSIS — H2511 Age-related nuclear cataract, right eye: Secondary | ICD-10-CM | POA: Diagnosis not present

## 2022-06-01 DIAGNOSIS — M4326 Fusion of spine, lumbar region: Secondary | ICD-10-CM | POA: Diagnosis not present

## 2022-06-01 DIAGNOSIS — R2681 Unsteadiness on feet: Secondary | ICD-10-CM | POA: Diagnosis not present

## 2022-06-06 NOTE — Discharge Instructions (Signed)

## 2022-06-07 ENCOUNTER — Encounter: Payer: Self-pay | Admitting: Ophthalmology

## 2022-06-07 ENCOUNTER — Encounter
Admission: RE | Disposition: A | Discharge status: Home / Self Care | Payer: Self-pay | Source: Home / Self Care | Attending: Ophthalmology

## 2022-06-07 ENCOUNTER — Other Ambulatory Visit: Payer: Self-pay

## 2022-06-07 ENCOUNTER — Ambulatory Visit: Payer: Medicare PPO | Admitting: General Practice

## 2022-06-07 ENCOUNTER — Ambulatory Visit
Admission: RE | Admit: 2022-06-07 | Discharge: 2022-06-07 | Disposition: A | Discharge status: Home / Self Care | Payer: Medicare PPO | Attending: Ophthalmology | Admitting: Ophthalmology

## 2022-06-07 ENCOUNTER — Ambulatory Visit (AMBULATORY_SURGERY_CENTER): Payer: Medicare PPO | Admitting: General Practice

## 2022-06-07 DIAGNOSIS — E1136 Type 2 diabetes mellitus with diabetic cataract: Secondary | ICD-10-CM | POA: Insufficient documentation

## 2022-06-07 DIAGNOSIS — H2511 Age-related nuclear cataract, right eye: Secondary | ICD-10-CM

## 2022-06-07 DIAGNOSIS — E119 Type 2 diabetes mellitus without complications: Secondary | ICD-10-CM

## 2022-06-07 DIAGNOSIS — F419 Anxiety disorder, unspecified: Secondary | ICD-10-CM | POA: Diagnosis not present

## 2022-06-07 DIAGNOSIS — K219 Gastro-esophageal reflux disease without esophagitis: Secondary | ICD-10-CM | POA: Insufficient documentation

## 2022-06-07 DIAGNOSIS — I10 Essential (primary) hypertension: Secondary | ICD-10-CM

## 2022-06-07 HISTORY — PX: CATARACT EXTRACTION W/PHACO: SHX586

## 2022-06-07 SURGERY — PHACOEMULSIFICATION, CATARACT, WITH IOL INSERTION
Anesthesia: Monitor Anesthesia Care | Site: Eye | Laterality: Right

## 2022-06-07 MED ORDER — FENTANYL CITRATE (PF) 100 MCG/2ML IJ SOLN
INTRAMUSCULAR | Status: DC | PRN
  Administered 2022-06-07: 50 ug via INTRAVENOUS

## 2022-06-07 MED ORDER — MOXIFLOXACIN HCL 0.5 % OP SOLN
OPHTHALMIC | Status: DC | PRN
  Administered 2022-06-07: 0.2 mL via OPHTHALMIC

## 2022-06-07 MED ORDER — MIDAZOLAM HCL 2 MG/2ML IJ SOLN
INTRAMUSCULAR | Status: DC | PRN
  Administered 2022-06-07: 1 mg via INTRAVENOUS

## 2022-06-07 MED ORDER — SIGHTPATH DOSE#1 BSS IO SOLN
INTRAOCULAR | Status: DC | PRN
  Administered 2022-06-07: 2 mL

## 2022-06-07 MED ORDER — FLUMAZENIL 0.5 MG/5ML IV SOLN
0.1000 mg | Freq: Once | INTRAVENOUS | Status: AC
  Administered 2022-06-07: 0.1 mg via INTRAVENOUS

## 2022-06-07 MED ORDER — BRIMONIDINE TARTRATE-TIMOLOL 0.2-0.5 % OP SOLN
OPHTHALMIC | Status: DC | PRN
  Administered 2022-06-07: 1 [drp] via OPHTHALMIC

## 2022-06-07 MED ORDER — LACTATED RINGERS IV SOLN: INTRAVENOUS | Status: DC

## 2022-06-07 MED ORDER — FLUMAZENIL 0.5 MG/5ML IV SOLN
0.2000 mg | Freq: Once | INTRAVENOUS | Status: AC
  Administered 2022-06-07: 0.2 mg via INTRAVENOUS

## 2022-06-07 MED ORDER — SIGHTPATH DOSE#1 NA CHONDROIT SULF-NA HYALURON 40-17 MG/ML IO SOLN
INTRAOCULAR | Status: DC | PRN
  Administered 2022-06-07: 1 mL via INTRAOCULAR

## 2022-06-07 MED ORDER — SIGHTPATH DOSE#1 BSS IO SOLN
INTRAOCULAR | Status: DC | PRN
  Administered 2022-06-07: 57 mL via OPHTHALMIC

## 2022-06-07 MED ORDER — SIGHTPATH DOSE#1 BSS IO SOLN
INTRAOCULAR | Status: DC | PRN
  Administered 2022-06-07: 15 mL via INTRAOCULAR

## 2022-06-07 MED ORDER — FENTANYL CITRATE PF 50 MCG/ML IJ SOSY: 25.0000 ug | PREFILLED_SYRINGE | INTRAMUSCULAR | Status: DC | PRN

## 2022-06-07 MED ORDER — ARMC OPHTHALMIC DILATING DROPS
1.0000 | OPHTHALMIC | Status: DC | PRN
  Administered 2022-06-07 (×3): 1 via OPHTHALMIC

## 2022-06-07 MED ORDER — ONDANSETRON HCL 4 MG/2ML IJ SOLN: 4.0000 mg | Freq: Once | INTRAMUSCULAR | Status: DC | PRN

## 2022-06-07 MED ORDER — TETRACAINE HCL 0.5 % OP SOLN
1.0000 [drp] | OPHTHALMIC | Status: DC | PRN
  Administered 2022-06-07 (×3): 1 [drp] via OPHTHALMIC

## 2022-06-07 SURGICAL SUPPLY — 12 items
CANNULA ANT/CHMB 27G (MISCELLANEOUS) IMPLANT
CANNULA ANT/CHMB 27GA (MISCELLANEOUS) IMPLANT
CATARACT SUITE SIGHTPATH (MISCELLANEOUS) ×1 IMPLANT
FEE CATARACT SUITE SIGHTPATH (MISCELLANEOUS) ×1 IMPLANT
GLOVE SURG ENC TEXT LTX SZ8 (GLOVE) ×1 IMPLANT
GLOVE SURG TRIUMPH 8.0 PF LTX (GLOVE) ×1 IMPLANT
LENS IOL TECNIS EYHANCE 12.0 (Intraocular Lens) IMPLANT
NDL FILTER BLUNT 18X1 1/2 (NEEDLE) ×1 IMPLANT
NEEDLE FILTER BLUNT 18X 1/2SAF (NEEDLE) ×1
NEEDLE FILTER BLUNT 18X1 1/2 (NEEDLE) ×1 IMPLANT
SYR 3ML LL SCALE MARK (SYRINGE) ×1 IMPLANT
WATER STERILE IRR 250ML POUR (IV SOLUTION) ×1 IMPLANT

## 2022-06-07 NOTE — Anesthesia Preprocedure Evaluation (Signed)
Anesthesia Evaluation  Patient identified by MRN, date of birth, ID band Patient awake    Reviewed: Allergy & Precautions, H&P , NPO status , Patient's Chart, lab work & pertinent test results, reviewed documented beta blocker date and time   History of Anesthesia Complications (+) history of anesthetic complications  Airway Mallampati: II  TM Distance: >3 FB Neck ROM: full    Dental no notable dental hx. (+) Teeth Intact   Pulmonary neg pulmonary ROS,    Pulmonary exam normal breath sounds clear to auscultation       Cardiovascular Exercise Tolerance: Good hypertension, On Medications negative cardio ROS   Rhythm:regular Rate:Normal     Neuro/Psych Anxiety Dementia  Neuromuscular disease negative psych ROS   GI/Hepatic Neg liver ROS, GERD  Medicated,  Endo/Other  negative endocrine ROSdiabetes  Renal/GU      Musculoskeletal   Abdominal   Peds  Hematology negative hematology ROS (+)   Anesthesia Other Findings   Reproductive/Obstetrics negative OB ROS                             Anesthesia Physical Anesthesia Plan  ASA: 3  Anesthesia Plan: MAC   Post-op Pain Management:    Induction:   PONV Risk Score and Plan:   Airway Management Planned:   Additional Equipment:   Intra-op Plan:   Post-operative Plan:   Informed Consent: I have reviewed the patients History and Physical, chart, labs and discussed the procedure including the risks, benefits and alternatives for the proposed anesthesia with the patient or authorized representative who has indicated his/her understanding and acceptance.       Plan Discussed with: CRNA  Anesthesia Plan Comments:         Anesthesia Quick Evaluation

## 2022-06-07 NOTE — Op Note (Signed)
PREOPERATIVE DIAGNOSIS:  Nuclear sclerotic cataract of the right eye.   POSTOPERATIVE DIAGNOSIS:  CATARACT   OPERATIVE PROCEDURE:ORPROCALL@   SURGEON:  Galen Manila, MD.   ANESTHESIA:  Anesthesiologist: Yevette Edwards, MD CRNA: Sharyn Dross, CRNA  1.      Managed anesthesia care. 2.      0.75ml of Shugarcaine was instilled in the eye following the paracentesis.   COMPLICATIONS:  None.   TECHNIQUE:   Stop and chop   DESCRIPTION OF PROCEDURE:  The patient was examined and consented in the preoperative holding area where the aforementioned topical anesthesia was applied to the right eye and then brought back to the Operating Room where the right eye was prepped and draped in the usual sterile ophthalmic fashion and a lid speculum was placed. A paracentesis was created with the side port blade and the anterior chamber was filled with viscoelastic. A near clear corneal incision was performed with the steel keratome. A continuous curvilinear capsulorrhexis was performed with a cystotome followed by the capsulorrhexis forceps. Hydrodissection and hydrodelineation were carried out with BSS on a blunt cannula. The lens was removed in a stop and chop  technique and the remaining cortical material was removed with the irrigation-aspiration handpiece. The capsular bag was inflated with viscoelastic and the Technis ZCB00  lens was placed in the capsular bag without complication. The remaining viscoelastic was removed from the eye with the irrigation-aspiration handpiece. The wounds were hydrated. The anterior chamber was flushed with BSS and the eye was inflated to physiologic pressure. 0.64ml of Vigamox was placed in the anterior chamber. The wounds were found to be water tight. The eye was dressed with Combigan. The patient was given protective glasses to wear throughout the day and a shield with which to sleep tonight. The patient was also given drops with which to begin a drop regimen today and will  follow-up with me in one day. Implant Name Type Inv. Item Serial No. Manufacturer Lot No. LRB No. Used Action  LENS IOL TECNIS EYHANCE 12.0 - [id number] Intraocular Lens LENS IOL TECNIS EYHANCE 12.0 [id number] [employer]  Right 1 Implanted   Procedure(s): CATARACT EXTRACTION PHACO AND INTRAOCULAR LENS PLACEMENT (IOC) RIGHT 4.84 00:31.5 (Right)  Electronically signed: Galen Manila 06/07/2022 9:11 AM

## 2022-06-07 NOTE — Transfer of Care (Signed)
Immediate Anesthesia Transfer of Care Note  Patient: [name]  Procedure(s) Performed: CATARACT EXTRACTION PHACO AND INTRAOCULAR LENS PLACEMENT (IOC) RIGHT 4.84 00:31.5 (Right: Eye)  Patient Location: PACU  Anesthesia Type: MAC  Level of Consciousness: awake, alert  and patient cooperative  Airway and Oxygen Therapy: Patient Spontanous Breathing and Patient connected to supplemental oxygen  Post-op Assessment: Post-op Vital signs reviewed, Patient's Cardiovascular Status Stable, Respiratory Function Stable, Patent Airway and No signs of Nausea or vomiting  Post-op Vital Signs: Reviewed and stable  Complications: There were no known notable events for this encounter.

## 2022-06-07 NOTE — Anesthesia Postprocedure Evaluation (Signed)
Anesthesia Post Note  Patient: [name]  Procedure(s) Performed: CATARACT EXTRACTION PHACO AND INTRAOCULAR LENS PLACEMENT (IOC) RIGHT 4.84 00:31.5 (Right: Eye)     Patient location during evaluation: PACU Anesthesia Type: MAC Level of consciousness: awake and alert Pain management: pain level controlled Vital Signs Assessment: post-procedure vital signs reviewed and stable Respiratory status: spontaneous breathing, nonlabored ventilation, respiratory function stable and patient connected to nasal cannula oxygen Cardiovascular status: stable and blood pressure returned to baseline Postop Assessment: no apparent nausea or vomiting Anesthetic complications: no   There were no known notable events for this encounter.  Molli Barrows
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
## 2022-06-08 ENCOUNTER — Encounter: Payer: Self-pay | Admitting: Ophthalmology

## 2022-06-10 DIAGNOSIS — R2681 Unsteadiness on feet: Secondary | ICD-10-CM | POA: Diagnosis not present

## 2022-06-10 DIAGNOSIS — M4326 Fusion of spine, lumbar region: Secondary | ICD-10-CM | POA: Diagnosis not present

## 2022-06-16 DIAGNOSIS — R2681 Unsteadiness on feet: Secondary | ICD-10-CM | POA: Diagnosis not present

## 2022-06-16 DIAGNOSIS — M4326 Fusion of spine, lumbar region: Secondary | ICD-10-CM | POA: Diagnosis not present

## 2022-06-20 DIAGNOSIS — R791 Abnormal coagulation profile: Secondary | ICD-10-CM | POA: Diagnosis not present

## 2022-06-20 DIAGNOSIS — E162 Hypoglycemia, unspecified: Secondary | ICD-10-CM | POA: Diagnosis not present

## 2022-06-20 DIAGNOSIS — M25552 Pain in left hip: Secondary | ICD-10-CM | POA: Diagnosis not present

## 2022-06-20 DIAGNOSIS — G35 Multiple sclerosis: Secondary | ICD-10-CM | POA: Diagnosis not present

## 2022-06-20 DIAGNOSIS — R471 Dysarthria and anarthria: Secondary | ICD-10-CM | POA: Diagnosis not present

## 2022-06-20 DIAGNOSIS — H538 Other visual disturbances: Secondary | ICD-10-CM | POA: Diagnosis not present

## 2022-06-20 DIAGNOSIS — R262 Difficulty in walking, not elsewhere classified: Secondary | ICD-10-CM | POA: Diagnosis not present

## 2022-06-20 DIAGNOSIS — R4182 Altered mental status, unspecified: Secondary | ICD-10-CM | POA: Diagnosis not present

## 2022-06-20 DIAGNOSIS — I440 Atrioventricular block, first degree: Secondary | ICD-10-CM | POA: Diagnosis not present

## 2022-06-21 DIAGNOSIS — M4326 Fusion of spine, lumbar region: Secondary | ICD-10-CM | POA: Diagnosis not present

## 2022-06-21 DIAGNOSIS — R2681 Unsteadiness on feet: Secondary | ICD-10-CM | POA: Diagnosis not present

## 2022-06-23 DIAGNOSIS — M4326 Fusion of spine, lumbar region: Secondary | ICD-10-CM | POA: Diagnosis not present

## 2022-06-23 DIAGNOSIS — R2681 Unsteadiness on feet: Secondary | ICD-10-CM | POA: Diagnosis not present

## 2022-06-27 DIAGNOSIS — C4491 Basal cell carcinoma of skin, unspecified: Secondary | ICD-10-CM | POA: Diagnosis not present

## 2022-06-27 DIAGNOSIS — L988 Other specified disorders of the skin and subcutaneous tissue: Secondary | ICD-10-CM | POA: Diagnosis not present

## 2022-06-29 DIAGNOSIS — G35 Multiple sclerosis: Secondary | ICD-10-CM | POA: Diagnosis not present

## 2022-06-29 DIAGNOSIS — M5489 Other dorsalgia: Secondary | ICD-10-CM | POA: Diagnosis not present

## 2022-06-29 DIAGNOSIS — M549 Dorsalgia, unspecified: Secondary | ICD-10-CM | POA: Diagnosis not present

## 2022-06-29 DIAGNOSIS — R42 Dizziness and giddiness: Secondary | ICD-10-CM | POA: Diagnosis not present

## 2022-06-29 DIAGNOSIS — F05 Delirium due to known physiological condition: Secondary | ICD-10-CM | POA: Diagnosis not present

## 2022-06-29 DIAGNOSIS — Z79899 Other long term (current) drug therapy: Secondary | ICD-10-CM | POA: Diagnosis not present

## 2022-06-30 DIAGNOSIS — R2681 Unsteadiness on feet: Secondary | ICD-10-CM | POA: Diagnosis not present

## 2022-06-30 DIAGNOSIS — M4326 Fusion of spine, lumbar region: Secondary | ICD-10-CM | POA: Diagnosis not present

## 2022-07-06 DIAGNOSIS — R2681 Unsteadiness on feet: Secondary | ICD-10-CM | POA: Diagnosis not present

## 2022-07-06 DIAGNOSIS — M4326 Fusion of spine, lumbar region: Secondary | ICD-10-CM | POA: Diagnosis not present

## 2022-07-18 DIAGNOSIS — I491 Atrial premature depolarization: Secondary | ICD-10-CM | POA: Diagnosis not present

## 2022-07-18 DIAGNOSIS — F29 Unspecified psychosis not due to a substance or known physiological condition: Secondary | ICD-10-CM | POA: Diagnosis not present

## 2022-07-18 DIAGNOSIS — Z0000 Encounter for general adult medical examination without abnormal findings: Secondary | ICD-10-CM | POA: Diagnosis not present

## 2022-07-18 DIAGNOSIS — G35 Multiple sclerosis: Secondary | ICD-10-CM | POA: Diagnosis not present

## 2022-07-18 DIAGNOSIS — M4326 Fusion of spine, lumbar region: Secondary | ICD-10-CM | POA: Diagnosis not present

## 2022-07-18 DIAGNOSIS — M810 Age-related osteoporosis without current pathological fracture: Secondary | ICD-10-CM | POA: Diagnosis not present

## 2022-07-18 DIAGNOSIS — E782 Mixed hyperlipidemia: Secondary | ICD-10-CM | POA: Diagnosis not present

## 2022-07-18 DIAGNOSIS — K580 Irritable bowel syndrome with diarrhea: Secondary | ICD-10-CM | POA: Diagnosis not present

## 2022-07-18 DIAGNOSIS — M545 Low back pain, unspecified: Secondary | ICD-10-CM | POA: Diagnosis not present

## 2022-07-18 DIAGNOSIS — R2681 Unsteadiness on feet: Secondary | ICD-10-CM | POA: Diagnosis not present

## 2022-07-18 DIAGNOSIS — F411 Generalized anxiety disorder: Secondary | ICD-10-CM | POA: Diagnosis not present

## 2022-07-27 DIAGNOSIS — R2681 Unsteadiness on feet: Secondary | ICD-10-CM | POA: Diagnosis not present

## 2022-07-27 DIAGNOSIS — M4326 Fusion of spine, lumbar region: Secondary | ICD-10-CM | POA: Diagnosis not present

## 2022-08-05 DIAGNOSIS — F325 Major depressive disorder, single episode, in full remission: Secondary | ICD-10-CM | POA: Diagnosis not present

## 2022-08-05 DIAGNOSIS — G301 Alzheimer's disease with late onset: Secondary | ICD-10-CM | POA: Diagnosis not present

## 2022-08-05 DIAGNOSIS — F02A0 Dementia in other diseases classified elsewhere, mild, without behavioral disturbance, psychotic disturbance, mood disturbance, and anxiety: Secondary | ICD-10-CM | POA: Diagnosis not present

## 2022-08-11 DIAGNOSIS — M4326 Fusion of spine, lumbar region: Secondary | ICD-10-CM | POA: Diagnosis not present

## 2022-08-11 DIAGNOSIS — R2681 Unsteadiness on feet: Secondary | ICD-10-CM | POA: Diagnosis not present

## 2022-08-17 DIAGNOSIS — M4326 Fusion of spine, lumbar region: Secondary | ICD-10-CM | POA: Diagnosis not present

## 2022-08-17 DIAGNOSIS — R2681 Unsteadiness on feet: Secondary | ICD-10-CM | POA: Diagnosis not present

## 2022-08-18 ENCOUNTER — Other Ambulatory Visit: Payer: Medicare Other

## 2022-08-18 ENCOUNTER — Telehealth: Payer: Self-pay | Admitting: Internal Medicine

## 2022-08-18 NOTE — Telephone Encounter (Signed)
Patient husband called the office stating [name] had to cancel [name]'s bone density today and he had to reschedule [name] in [place] in January. He states they told him they would not be able to compare since the last was done somewhere else. He wants to make sure that is ok. Please advise.

## 2022-08-18 NOTE — Telephone Encounter (Signed)
Patient's husband called stating [name]'s dexa scan which was scheduled for today, 08/18/22 was cancelled by the provider and rescheduled for 01/20/23.  He states [name] was waiting a long time for this appointment so Dr. Dimple Casey could evaluate if she could stop taking Forteo.  [name] requested a return call to discuss a referral to another facility.

## 2022-08-19 NOTE — Telephone Encounter (Signed)
Patient's husband called again stating Rowynn is scheduled for 10/26/2022 at [facility].

## 2022-08-22 NOTE — Telephone Encounter (Signed)
Patient's husband advised Dr. Dimple Casey is glad this was rescheduled sooner, he is not sure exactly how much earlier we could get with another referral. January would be at about 21 months of total medication treatment on Forteo which is still in the normal range so that time is okay for Dr. Dimple Casey.   Advised we should also reschedule our clinic appointment since Dr. Dimple Casey won't have new data as planned, ideally would be about 2 weeks after her imaging to make sure we can have the results. She had outside labs drawn in September that I reviewed and these look okay for continuing the teriparatide. Her alkaline phosphatase is high but not concerning, this is due to increased bone activity from the medication. Patient's husband expressed understanding and follow up appointment has been rescheduled for 11/09/2022.

## 2022-08-22 NOTE — Telephone Encounter (Signed)
I am glad this was rescheduled sooner, I am not sure exactly how much earlier we could get with another referral but we can try if she wants. January would be at about 21 months of total medication treatment on Forteo which is still in the normal range so that time is okay for me.  We should also reschedule our clinic appointment since I won't have new data as planned, ideally would be about 2 weeks after her imaging to make sure we can have the results. She had outside labs drawn in September that I reviewed and these look okay for continuing the teriparatide. Her alkaline phosphatase is high but not concerning, this is due to increased bone activity from the medication.

## 2022-08-24 ENCOUNTER — Ambulatory Visit: Payer: Medicare Other | Admitting: Internal Medicine

## 2022-08-30 ENCOUNTER — Ambulatory Visit: Payer: Medicare Other | Admitting: Internal Medicine

## 2022-09-19 ENCOUNTER — Telehealth: Payer: Self-pay | Admitting: Internal Medicine

## 2022-09-19 NOTE — Telephone Encounter (Signed)
Pt called and stated Dr. Pearletha Forge, neurologist, had Pt taking 1.5 mg of Rivastigmine.    Pt stated she has been having palpitations and intermittent chest tightness after taking this medicine.  Symptoms go away after a few hours.      Pt called Dr. Ladona Ridgel to consult.  Dr. Ladona Ridgel told patient to Hold / stop taking medicine.  Pt told to evaluate, to see if symptoms return.  If symptoms return contact [facility];  If they do not, call Dr. Threasa Beards, and report what symptoms medicine was creating.    Either way, Dr. Threasa Beards must be followed up with.

## 2022-09-19 NOTE — Telephone Encounter (Signed)
Pt c/o medication issue:  1. Name of Medication: Rivastigmine  2. How are you currently taking this medication (dosage and times per day)? 1 time a day  3. Are you having a reaction (difficulty breathing--STAT)?   4. What is your medication issue? Having palpitations and tightness in her chest

## 2022-10-20 DIAGNOSIS — Z9889 Other specified postprocedural states: Secondary | ICD-10-CM | POA: Diagnosis not present

## 2022-10-20 DIAGNOSIS — G35 Multiple sclerosis: Secondary | ICD-10-CM | POA: Diagnosis not present

## 2022-10-26 ENCOUNTER — Ambulatory Visit: Payer: Self-pay

## 2022-10-26 ENCOUNTER — Ambulatory Visit
Admission: RE | Admit: 2022-10-26 | Discharge: 2022-10-26 | Disposition: A | Discharge status: Home / Self Care | Payer: Medicare PPO | Source: Ambulatory Visit | Attending: Internal Medicine | Admitting: Internal Medicine

## 2022-10-26 ENCOUNTER — Ambulatory Visit (LOCAL_COMMUNITY_HEALTH_CENTER): Payer: Medicare PPO

## 2022-10-26 DIAGNOSIS — G6289 Other specified polyneuropathies: Secondary | ICD-10-CM | POA: Diagnosis not present

## 2022-10-26 DIAGNOSIS — M810 Age-related osteoporosis without current pathological fracture: Secondary | ICD-10-CM | POA: Diagnosis not present

## 2022-10-26 DIAGNOSIS — G35 Multiple sclerosis: Secondary | ICD-10-CM | POA: Diagnosis not present

## 2022-10-26 DIAGNOSIS — R296 Repeated falls: Secondary | ICD-10-CM | POA: Diagnosis not present

## 2022-10-26 DIAGNOSIS — Z23 Encounter for immunization: Secondary | ICD-10-CM

## 2022-10-26 DIAGNOSIS — E559 Vitamin D deficiency, unspecified: Secondary | ICD-10-CM | POA: Insufficient documentation

## 2022-10-26 DIAGNOSIS — Z719 Counseling, unspecified: Secondary | ICD-10-CM

## 2022-10-26 NOTE — Progress Notes (Signed)
Bone density test does not show improvement compared to her last test. We need to keep the planned follow up appointment to discuss the result and possible medication change.

## 2022-10-26 NOTE — Progress Notes (Signed)
Client seen in nurse clinic for Covid-19 vaccine.  VIS provided Vaccine screening: Are you feeling sick today? No   Have you ever received a dose of COVID-19 Vaccine? [employer], [place], [place], [place], Other) Yes  If yes, which vaccine and how many doses?   [place]  Did you bring the vaccination record card or other documentation?  Yes   Do you have a health condition or are undergoing treatment that makes you moderately or severely immunocompromised? This would include, but not be limited to: cancer, HIV, organ transplant, immunosuppressive therapy/high-dose corticosteroids, or moderate/severe primary immunodeficiency.  No  Have you received COVID-19 vaccine before or during hematopoietic cell transplant (HCT) or CAR-T-cell therapies? No  Have you ever had an allergic reaction to: (This would include a severe allergic reaction or a reaction that caused hives, swelling, or respiratory distress, including wheezing.) A component of a COVID-19 vaccine or a previous dose of COVID-19 vaccine? No   Have you ever had an allergic reaction to another vaccine (other thanCOVID-19 vaccine) or an injectable medication? (This would include a severe allergic reaction or a reaction that caused hives, swelling, or respiratory distress, including wheezing.)   No    Do you have a history of any of the following:  Myocarditis or Pericarditis No  Dermal fillers:  No  Multisystem Inflammatory Syndrome (MIS-C or MIS-A)? No  COVID-19 disease within the past 3 months? No  Vaccinated with monkeypox vaccine in the last 4 weeks? No   Vaccine administered and tolerated well, after vaccine care reviewed, copy of [facility] provided.   Tiffiny Worthy Shelda Pal, RN
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
## 2022-11-09 ENCOUNTER — Ambulatory Visit: Payer: Medicare PPO | Attending: Internal Medicine | Admitting: Internal Medicine

## 2022-11-09 ENCOUNTER — Encounter: Payer: Self-pay | Admitting: Internal Medicine

## 2022-11-09 VITALS — BP 115/72 | HR 62 | Resp 12 | Ht 66.0 in | Wt 127.0 lb

## 2022-11-09 DIAGNOSIS — E559 Vitamin D deficiency, unspecified: Secondary | ICD-10-CM | POA: Diagnosis not present

## 2022-11-09 DIAGNOSIS — G35 Multiple sclerosis: Secondary | ICD-10-CM | POA: Diagnosis not present

## 2022-11-09 DIAGNOSIS — K638219 Small intestinal bacterial overgrowth, unspecified: Secondary | ICD-10-CM

## 2022-11-09 DIAGNOSIS — M810 Age-related osteoporosis without current pathological fracture: Secondary | ICD-10-CM

## 2022-11-09 DIAGNOSIS — R296 Repeated falls: Secondary | ICD-10-CM

## 2022-11-09 NOTE — Patient Instructions (Signed)

## 2022-11-10 ENCOUNTER — Telehealth: Payer: Self-pay | Admitting: Pharmacist

## 2022-11-10 LAB — COMPLETE METABOLIC PANEL WITH GFR
AG Ratio: 1.6 (calc) (ref 1.0–2.5)
ALT: 12 U/L (ref 6–29)
AST: 20 U/L (ref 10–35)
Albumin: 3.9 g/dL (ref 3.6–5.1)
Alkaline phosphatase (APISO): 101 U/L (ref 37–153)
BUN/Creatinine Ratio: 39 (calc) — ABNORMAL HIGH (ref 6–22)
BUN: 27 mg/dL — ABNORMAL HIGH (ref 7–25)
CO2: 33 mmol/L — ABNORMAL HIGH (ref 20–32)
Calcium: 9.7 mg/dL (ref 8.6–10.4)
Chloride: 106 mmol/L (ref 98–110)
Creat: 0.7 mg/dL (ref 0.50–1.05)
Globulin: 2.4 g/dL (calc) (ref 1.9–3.7)
Glucose, Bld: 78 mg/dL (ref 65–99)
Potassium: 5.2 mmol/L (ref 3.5–5.3)
Sodium: 144 mmol/L (ref 135–146)
Total Bilirubin: 0.6 mg/dL (ref 0.2–1.2)
Total Protein: 6.3 g/dL (ref 6.1–8.1)
eGFR: 94 mL/min/{1.73_m2} (ref >=60)

## 2022-11-10 LAB — VITAMIN D 25 HYDROXY (VIT D DEFICIENCY, FRACTURES): Vit D, 25-Hydroxy: 42 ng/mL (ref 30–100)

## 2022-11-10 NOTE — Progress Notes (Signed)
Lab results look fine no problem for starting with Reclast.  Her vitamin D level is 42 which is at goal so can continue the current rate of supplementation.

## 2022-11-10 NOTE — Telephone Encounter (Addendum)
Patient is new start to Reclast. Referral placed to [employer] [facility] with start date of on or after 12/01/2022  Reclast - [id number]  Current regimen: Forteo4  Dose: 5mg  IV every 12 months Premedications: acetaminophen 650mg  PO and diphenhydramine 25mg  PO 30-45 minutes before infusion  Can start Reclast after 12/01/2022  Knox Saliva, PharmD, MPH, BCPS, CPP Clinical Pharmacist (Rheumatology and Pulmonology)  ----- Message from Carole Binning, LPN sent at 0/17/7939  1:47 PM EST ----- Per Dr. Benjamine Mola, please apply for Reclast infusion. Patient is currently on Forteo and last injection will be 12/01/2022. Patient has been on Forteo for 2 years.

## 2022-11-14 ENCOUNTER — Telehealth: Payer: Self-pay | Admitting: Pharmacy Technician

## 2022-11-14 NOTE — Telephone Encounter (Signed)
Auth Submission: NO AUTH NEEDED Payer: [employer] MEDICARE Medication & CPT/J Code(s) submitted: Reclast (Zolendronic acid) [id number] Route of submission (phone, fax, portal):  Phone # Fax # Auth type: Buy/Bill Units/visits requested: X1` Reference number:  Approval from: 11/14/22 to 11/15/23

## 2022-11-18 DIAGNOSIS — G35 Multiple sclerosis: Secondary | ICD-10-CM | POA: Diagnosis not present

## 2022-11-18 DIAGNOSIS — F0390 Unspecified dementia without behavioral disturbance: Secondary | ICD-10-CM | POA: Diagnosis not present

## 2022-11-18 DIAGNOSIS — E782 Mixed hyperlipidemia: Secondary | ICD-10-CM | POA: Diagnosis not present

## 2022-11-18 DIAGNOSIS — F411 Generalized anxiety disorder: Secondary | ICD-10-CM | POA: Diagnosis not present

## 2022-11-18 DIAGNOSIS — F322 Major depressive disorder, single episode, severe without psychotic features: Secondary | ICD-10-CM | POA: Diagnosis not present

## 2022-11-18 NOTE — Telephone Encounter (Signed)
Reclast scheduled for 11/24/2022  Knox Saliva, PharmD, MPH, BCPS, CPP Clinical Pharmacist (Rheumatology and Pulmonology)

## 2022-11-24 ENCOUNTER — Ambulatory Visit (INDEPENDENT_AMBULATORY_CARE_PROVIDER_SITE_OTHER): Payer: Medicare PPO

## 2022-11-24 VITALS — BP 158/74 | HR 60 | Temp 97.7°F | Resp 18 | Ht 65.0 in | Wt 127.0 lb

## 2022-11-24 DIAGNOSIS — M810 Age-related osteoporosis without current pathological fracture: Secondary | ICD-10-CM

## 2022-11-24 DIAGNOSIS — T50995A Adverse effect of other drugs, medicaments and biological substances, initial encounter: Secondary | ICD-10-CM | POA: Diagnosis not present

## 2022-11-24 MED ORDER — FAMOTIDINE IN NACL 20-0.9 MG/50ML-% IV SOLN
20.0000 mg | Freq: Once | INTRAVENOUS | Status: AC | PRN
  Administered 2022-11-24: 20 mg via INTRAVENOUS

## 2022-11-24 MED ORDER — DIPHENHYDRAMINE HCL 25 MG PO CAPS
25.0000 mg | ORAL_CAPSULE | Freq: Once | ORAL | Status: AC
  Administered 2022-11-24: 25 mg via ORAL
  Filled 2022-11-24: qty 1

## 2022-11-24 MED ORDER — DIPHENHYDRAMINE HCL 50 MG/ML IJ SOLN
50.0000 mg | Freq: Once | INTRAMUSCULAR | Status: AC | PRN
  Administered 2022-11-24: 25 mg via INTRAVENOUS

## 2022-11-24 MED ORDER — ZOLEDRONIC ACID 5 MG/100ML IV SOLN
5.0000 mg | Freq: Once | INTRAVENOUS | Status: AC
  Administered 2022-11-24: 5 mg via INTRAVENOUS
  Filled 2022-11-24: qty 100

## 2022-11-24 MED ORDER — SODIUM CHLORIDE 0.9 % IV SOLN: INTRAVENOUS | Status: DC

## 2022-11-24 MED ORDER — METHYLPREDNISOLONE SODIUM SUCC 125 MG IJ SOLR
125.0000 mg | Freq: Once | INTRAMUSCULAR | Status: AC | PRN
  Administered 2022-11-24: 125 mg via INTRAVENOUS

## 2022-11-24 MED ORDER — ALBUTEROL SULFATE HFA 108 (90 BASE) MCG/ACT IN AERS
2.0000 | INHALATION_SPRAY | Freq: Once | RESPIRATORY_TRACT | Status: AC | PRN
  Administered 2022-11-24: 2 via RESPIRATORY_TRACT

## 2022-11-24 MED ORDER — EPINEPHRINE 0.3 MG/0.3ML IJ SOAJ: 0.3000 mg | Freq: Once | INTRAMUSCULAR | Status: DC | PRN

## 2022-11-24 MED ORDER — SODIUM CHLORIDE 0.9 % IV SOLN: Freq: Once | INTRAVENOUS | Status: AC | PRN

## 2022-11-24 MED ORDER — ACETAMINOPHEN 325 MG PO TABS
650.0000 mg | ORAL_TABLET | Freq: Once | ORAL | Status: AC
  Administered 2022-11-24: 650 mg via ORAL
  Filled 2022-11-24: qty 2

## 2022-11-24 NOTE — Progress Notes (Addendum)
Diagnosis: Osteoporosis  Provider:  Marshell Garfinkel MD  Procedure: Infusion  IV Type: Peripheral, IV Location: L Antecubital  Reclast (Zolendronic Acid), Dose: 5 mg  Infusion Start Time: [id number]  Infusion Stop Time: 1423   Normal Saline, Dose: 1000 mg  Infusion Start Time: [id number]  Infusion Stop Time: 1540   Pepcid (Famotidine), Dose: 20 mg  Infusion Start Time: [id number]  Infusion Stop Time: [id number]  Reclast stopped at 1423 due to pt complains of "tightness in chest". Emergency meds were administered Solumedrol 129m IVP, benadryl 216mIVP, albuterol inhaler 2 puffs po, and pepcid 2081mVPB. Prescribing doctor was notified, he advised to not resume. Will monitor pt for change in symptoms.  1515-pt reports that symptoms have subsided. NS infusion still running will continue to monitor.  1545-pt complains of numbness in legs bilaterally and some speech slurring. No other signs of possible stroke. She stated that benadryl gives her some strange symptoms. Patient advised may be side effect from benadryl. Patient was informed if any symptoms reoccur or worsen to call 911 or report to nearest emergency room. Patient verbalized understanding.   Post Infusion IV Care: Observation period completed and Peripheral IV Discontinued  Discharge: Condition: Stable, Destination: Home . AVS Provided  Performed by:  LauAdelina MingsPN

## 2022-11-24 NOTE — Patient Instructions (Signed)
If any symptoms of Shortness of breath/chest pain/difficulty breathing/numbness in limbs/face call 911/ go to emergency room   If symptoms reoccur report to [facility] Emergency Room or call 911.
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
## 2022-12-05 ENCOUNTER — Encounter: Payer: Self-pay | Admitting: Physician Assistant

## 2022-12-05 ENCOUNTER — Ambulatory Visit: Payer: Medicare PPO | Attending: Physician Assistant | Admitting: Physician Assistant

## 2022-12-05 VITALS — BP 110/72 | HR 58 | Ht 65.0 in | Wt 127.2 lb

## 2022-12-05 DIAGNOSIS — R002 Palpitations: Secondary | ICD-10-CM | POA: Diagnosis not present

## 2022-12-05 DIAGNOSIS — Z5181 Encounter for therapeutic drug level monitoring: Secondary | ICD-10-CM | POA: Diagnosis not present

## 2022-12-05 DIAGNOSIS — G35 Multiple sclerosis: Secondary | ICD-10-CM | POA: Diagnosis not present

## 2022-12-05 DIAGNOSIS — I491 Atrial premature depolarization: Secondary | ICD-10-CM | POA: Diagnosis not present

## 2022-12-05 DIAGNOSIS — F02A0 Dementia in other diseases classified elsewhere, mild, without behavioral disturbance, psychotic disturbance, mood disturbance, and anxiety: Secondary | ICD-10-CM | POA: Diagnosis not present

## 2022-12-05 DIAGNOSIS — Z79899 Other long term (current) drug therapy: Secondary | ICD-10-CM

## 2022-12-05 DIAGNOSIS — G301 Alzheimer's disease with late onset: Secondary | ICD-10-CM | POA: Diagnosis not present

## 2022-12-05 NOTE — Patient Instructions (Signed)
Medication Instructions:   Your physician recommends that you continue on your current medications as directed. Please refer to the Current Medication list given to you today.  *If you need a refill on your cardiac medications before your next appointment, please call your pharmacy*   Lab Work: [place]   If you have labs (blood work) drawn today and your tests are completely normal, you will receive your results only by: [place] (if you have MyChart) OR A paper copy in the mail If you have any lab test that is abnormal or we need to change your treatment, we will call you to review the results.   Testing/Procedures: NONE ORDERED  TODAY     Follow-Up: At [facility], you and your health needs are our priority.  As part of our continuing mission to provide you with exceptional heart care, we have created designated Provider Care Teams.  These Care Teams include your primary Cardiologist (physician) and Advanced Practice Providers (APPs -  Physician Assistants and Nurse Practitioners) who all work together to provide you with the care you need, when you need it.  We recommend signing up for the patient portal called "MyChart".  Sign up information is provided on this After Visit Summary.  MyChart is used to connect with patients for Virtual Visits (Telemedicine).  Patients are able to view lab/test results, encounter notes, upcoming appointments, etc.  Non-urgent messages can be sent to your provider as well.   To learn more about what you can do with MyChart, go to [email or web address].    Your next appointment:   4 month(s)  Provider:   You may see :Tommye Standard, PA-C  Other Instructions

## 2022-12-15 DIAGNOSIS — H53461 Homonymous bilateral field defects, right side: Secondary | ICD-10-CM | POA: Diagnosis not present

## 2022-12-15 DIAGNOSIS — G35 Multiple sclerosis: Secondary | ICD-10-CM | POA: Diagnosis not present

## 2022-12-15 DIAGNOSIS — H26493 Other secondary cataract, bilateral: Secondary | ICD-10-CM | POA: Diagnosis not present

## 2022-12-15 DIAGNOSIS — H35342 Macular cyst, hole, or pseudohole, left eye: Secondary | ICD-10-CM | POA: Diagnosis not present

## 2022-12-15 DIAGNOSIS — H53462 Homonymous bilateral field defects, left side: Secondary | ICD-10-CM | POA: Diagnosis not present

## 2022-12-15 DIAGNOSIS — Z961 Presence of intraocular lens: Secondary | ICD-10-CM | POA: Diagnosis not present

## 2022-12-26 DIAGNOSIS — L720 Epidermal cyst: Secondary | ICD-10-CM | POA: Diagnosis not present

## 2022-12-26 DIAGNOSIS — L578 Other skin changes due to chronic exposure to nonionizing radiation: Secondary | ICD-10-CM | POA: Diagnosis not present

## 2022-12-26 DIAGNOSIS — Z872 Personal history of diseases of the skin and subcutaneous tissue: Secondary | ICD-10-CM | POA: Diagnosis not present

## 2022-12-26 DIAGNOSIS — D492 Neoplasm of unspecified behavior of bone, soft tissue, and skin: Secondary | ICD-10-CM | POA: Diagnosis not present

## 2022-12-26 DIAGNOSIS — Z85828 Personal history of other malignant neoplasm of skin: Secondary | ICD-10-CM | POA: Diagnosis not present

## 2022-12-26 DIAGNOSIS — L821 Other seborrheic keratosis: Secondary | ICD-10-CM | POA: Diagnosis not present

## 2023-01-06 DIAGNOSIS — H26493 Other secondary cataract, bilateral: Secondary | ICD-10-CM | POA: Diagnosis not present

## 2023-01-13 DIAGNOSIS — Z0100 Encounter for examination of eyes and vision without abnormal findings: Secondary | ICD-10-CM | POA: Diagnosis not present

## 2023-01-13 DIAGNOSIS — H35342 Macular cyst, hole, or pseudohole, left eye: Secondary | ICD-10-CM | POA: Diagnosis not present

## 2023-01-13 DIAGNOSIS — H26491 Other secondary cataract, right eye: Secondary | ICD-10-CM | POA: Diagnosis not present

## 2023-01-13 DIAGNOSIS — G35 Multiple sclerosis: Secondary | ICD-10-CM | POA: Diagnosis not present

## 2023-01-13 DIAGNOSIS — H04123 Dry eye syndrome of bilateral lacrimal glands: Secondary | ICD-10-CM | POA: Diagnosis not present

## 2023-01-14 DIAGNOSIS — M5030 Other cervical disc degeneration, unspecified cervical region: Secondary | ICD-10-CM | POA: Diagnosis not present

## 2023-01-14 DIAGNOSIS — M47812 Spondylosis without myelopathy or radiculopathy, cervical region: Secondary | ICD-10-CM | POA: Diagnosis not present

## 2023-01-14 DIAGNOSIS — G9389 Other specified disorders of brain: Secondary | ICD-10-CM | POA: Diagnosis not present

## 2023-01-14 DIAGNOSIS — F0280 Dementia in other diseases classified elsewhere without behavioral disturbance: Secondary | ICD-10-CM | POA: Diagnosis not present

## 2023-01-14 DIAGNOSIS — M9971 Connective tissue and disc stenosis of intervertebral foramina of cervical region: Secondary | ICD-10-CM | POA: Diagnosis not present

## 2023-01-14 DIAGNOSIS — G309 Alzheimer's disease, unspecified: Secondary | ICD-10-CM | POA: Diagnosis not present

## 2023-01-14 DIAGNOSIS — G3189 Other specified degenerative diseases of nervous system: Secondary | ICD-10-CM | POA: Diagnosis not present

## 2023-01-14 DIAGNOSIS — M4692 Unspecified inflammatory spondylopathy, cervical region: Secondary | ICD-10-CM | POA: Diagnosis not present

## 2023-01-14 DIAGNOSIS — G35 Multiple sclerosis: Secondary | ICD-10-CM | POA: Diagnosis not present

## 2023-01-20 ENCOUNTER — Other Ambulatory Visit: Payer: Medicare PPO

## 2023-01-31 ENCOUNTER — Telehealth: Payer: Self-pay | Admitting: Internal Medicine

## 2023-01-31 NOTE — Telephone Encounter (Signed)
Patient c/o Palpitations:  High priority if patient c/o lightheadedness, shortness of breath, or chest pain  How long have you had palpitations/irregular HR/ Afib? Are you having the symptoms now? Has been feeling them for a few days, is having symptoms now  Are you currently experiencing lightheadedness, SOB or CP? No  Do you have a history of afib (atrial fibrillation) or irregular heart rhythm? Yes  Have you checked your BP or HR? (document readings if available): No   Are you experiencing any other symptoms? No

## 2023-01-31 NOTE — Telephone Encounter (Signed)
Called pt in regards to palpitations.  Reports started about 2 days ago.  Has pressure to center of chest.  Has not noted a pattern to when chest pressure occurs.  Has not missed any doses of Flecainide.   Pt reports the only thing that has changed is taking rivastigmine for memory.  Has not started any other new medications.  Denies recent illness.  Does not have a BP cuff so no recent BP reading.  Advised pt will send message to provider to address.  However, if symptoms worsen to call 911 for evaluation.

## 2023-02-01 NOTE — Telephone Encounter (Signed)
Pt called back per message received from [facility] Triage and Provider, Ms. Francis Dowse, PA-C.  Urgent follow up, as Pt is  symptomatic, with concerns required clarification.  Pt states similar to note by [facility] RN below.  Pt has intermittent palpitations, and a burning sensation in her chest that occurs at different periods of time, on / off throughout each day. Symptoms started Monday, 01/30/2023 and have become increasing uncomfortable when they occur.  ( Pt denied any symptoms during the time of my telephone call with her. ) Pt stated they do not last.  Pt also c/o of dizziness when going from sitting to standing position, and confirmed that her dizziness was positional.    All EP APP, and Gen Card APP schedules were full at time of call with Pt.  I am in agreement with Provider Ms. Renee PA-C, that this Pt needs to be assessed in clinic, and since all schedules were full, Pt was scheduled in DOD time slot with Dr. Nelly Laurence at 930 am, 02/02/2023.    Pt also advised if symptoms of burning chest discomfort or palpitations return, that we recommend a providers assessment at the nearest ER, if occurs before [facility] appointment on 02/02/2023.  Pt appreciated Dr. Nelly Laurence seeing her, and verbalized understanding to ER visit if symptoms return or worsen.

## 2023-02-01 NOTE — Telephone Encounter (Signed)
Called Pt back per message received from [facility] Triage.   Voicemail message left to discuss concern of palpitations vs chest pain, and possibly schedule an appointment with a [facility] provider.  Follow up is required.

## 2023-02-02 ENCOUNTER — Encounter: Payer: Self-pay | Admitting: Cardiovascular Disease

## 2023-02-02 ENCOUNTER — Ambulatory Visit: Payer: Medicare PPO | Attending: Cardiovascular Disease | Admitting: Cardiovascular Disease

## 2023-02-02 VITALS — Ht 65.0 in | Wt 132.0 lb

## 2023-02-02 DIAGNOSIS — R072 Precordial pain: Secondary | ICD-10-CM | POA: Diagnosis not present

## 2023-02-02 DIAGNOSIS — R002 Palpitations: Secondary | ICD-10-CM | POA: Diagnosis not present

## 2023-02-02 NOTE — Patient Instructions (Signed)
Medication Instructions:  Your physician recommends that you continue on your current medications as directed. Please refer to the Current Medication list given to you today.  *If you need a refill on your cardiac medications before your next appointment, please call your pharmacy*  Lab Work: TODAY: BMET If you have labs (blood work) drawn today and your tests are completely normal, you will receive your results only by: MyChart Message (if you have MyChart) OR A paper copy in the mail If you have any lab test that is abnormal or we need to change your treatment, we will call you to review the results.  Testing/Procedures: Your physician has requested that you have cardiac CT. Cardiac computed tomography (CT) is a painless test that uses an x-ray machine to take clear, detailed pictures of your heart. For further information please visit [email or web address] Please follow instruction sheet as given.  Follow-Up: At [facility], you and your health needs are our priority.  As part of our continuing mission to provide you with exceptional heart care, we have created designated Provider Care Teams.  These Care Teams include your primary Cardiologist (physician) and Advanced Practice Providers (APPs -  Physician Assistants and Nurse Practitioners) who all work together to provide you with the care you need, when you need it.  Your next appointment:   As scheduled  Other Instructions   Your cardiac CT will be scheduled at one of the below locations:   [facility] [address] [place], [place] [address] [phone]  If scheduled at [facility] please arrive at the [facility] En[facility]ance (Entrance C2) of [facility] 30 minutes prior to test start time. You can use the FREE valet parking offered at entrance C (encouraged to control the heart rate for the test)  Proceed to the [facility] Radiology Department (first floor) to check-in and  test prep.  All radiology patients and guests should use entrance C2 at [facility], accessed from [facility], even though the hospital's physical address listed is [address]     Please follow these instructions carefully (unless otherwise directed):  On the Night Before the Test: Be sure to Drink plenty of water. Do not consume any caffeinated/decaffeinated beverages or chocolate 12 hours prior to your test. Do not take any antihistamines 12 hours prior to your test.  On the Day of the Test: Drink plenty of water until 1 hour prior to the test. Do not eat any food 1 hour prior to test. You may take your regular medications prior to the test.  FEMALES- please wear underwire-free bra if available, avoid dresses & tight clothing      After the Test: Drink plenty of water. After receiving IV contrast, you may experience a mild flushed feeling. This is normal. On occasion, you may experience a mild rash up to 24 hours after the test. This is not dangerous. If this occurs, you can take Benadryl 25 mg and increase your fluid intake. If you experience trouble breathing, this can be serious. If it is severe call 911 IMMEDIATELY. If it is mild, please call our office. If you take any of these medications: Glipizide/Metformin, Avandament, Glucavance, please do not take 48 hours after completing test unless otherwise instructed.  We will call to schedule your test 2-4 weeks out understanding that some insurance companies will need an authorization prior to the service being performed.   For non-scheduling related questions, please contact the cardiac  imaging nurse navigator should you have any questions/concerns: Rockwell [place], Cardiac Imaging Nurse Navigator Larey Brick, Cardiac Imaging Nurse Navigator [facility] Direct Office Dial: [phone]   For scheduling needs, including cancellations and rescheduling, please call  [place], [phone].
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
## 2023-02-02 NOTE — Addendum Note (Signed)
Addended by: Frutoso Schatz on: 02/02/2023 09:56 AM   Modules accepted: Orders

## 2023-02-03 ENCOUNTER — Encounter: Payer: Self-pay | Admitting: Internal Medicine

## 2023-02-03 LAB — BASIC METABOLIC PANEL
BUN/Creatinine Ratio: 29 — ABNORMAL HIGH (ref 12–28)
BUN: 20 mg/dL (ref 8–27)
CO2: 24 mmol/L (ref 20–29)
Calcium: 9.3 mg/dL (ref 8.7–10.3)
Chloride: 107 mmol/L — ABNORMAL HIGH (ref 96–106)
Creatinine, Ser: 0.69 mg/dL (ref 0.57–1.00)
Glucose: 89 mg/dL (ref 70–99)
Potassium: 4.5 mmol/L (ref 3.5–5.2)
Sodium: 145 mmol/L — ABNORMAL HIGH (ref 134–144)
eGFR: 94 mL/min/{1.73_m2} (ref >59)
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
## 2023-02-06 ENCOUNTER — Encounter: Payer: Self-pay | Admitting: Internal Medicine

## 2023-02-06 ENCOUNTER — Ambulatory Visit: Payer: Medicare PPO | Attending: Internal Medicine | Admitting: Internal Medicine

## 2023-02-06 VITALS — BP 111/68 | HR 62 | Resp 12 | Ht 66.0 in | Wt 130.0 lb

## 2023-02-06 DIAGNOSIS — G35 Multiple sclerosis: Secondary | ICD-10-CM | POA: Diagnosis not present

## 2023-02-06 DIAGNOSIS — M810 Age-related osteoporosis without current pathological fracture: Secondary | ICD-10-CM

## 2023-02-07 ENCOUNTER — Telehealth (HOSPITAL_COMMUNITY): Payer: Self-pay | Admitting: *Deleted

## 2023-02-07 ENCOUNTER — Telehealth: Payer: Self-pay | Admitting: Pharmacist

## 2023-02-07 NOTE — Telephone Encounter (Addendum)
Referral to [employer] for Prolia placed. Start date of 05/11/2023. Patient received partial Reclast on 11/24/2022 (had infusion reaction)  She will need updated labs within 2-3 weeks of receiving Prolia  Chesley Mires, PharmD, MPH, BCPS, CPP Clinical Pharmacist (Rheumatology and Pulmonology)  ----- Message from Metta Clines, RT sent at 02/06/2023  3:15 PM EDT ----- Regarding: New Start Prolia

## 2023-02-07 NOTE — Telephone Encounter (Signed)
Reaching out to patient to offer assistance regarding upcoming cardiac imaging study; pt's husband answered phone and verbalizes understanding of appt date/time, parking situation and where to check in, pre-test NPO status and verified current allergies; name and call back number provided for further questions should they arise  Larey Brick RN Navigator Cardiac Imaging [facility] [phone] office [phone] cell  Patient to take daily medications. Her husband is aware she is to arrive at 3pm.

## 2023-02-08 ENCOUNTER — Ambulatory Visit (HOSPITAL_COMMUNITY)
Admission: RE | Admit: 2023-02-08 | Discharge: 2023-02-08 | Disposition: A | Discharge status: Home / Self Care | Payer: Medicare PPO | Source: Ambulatory Visit | Attending: Cardiovascular Disease | Admitting: Cardiovascular Disease

## 2023-02-08 ENCOUNTER — Telehealth: Payer: Self-pay

## 2023-02-08 DIAGNOSIS — R072 Precordial pain: Secondary | ICD-10-CM | POA: Diagnosis not present

## 2023-02-08 MED ORDER — NITROGLYCERIN 0.4 MG SL SUBL
SUBLINGUAL_TABLET | SUBLINGUAL | Status: AC
  Filled 2023-02-08: qty 2

## 2023-02-08 MED ORDER — NITROGLYCERIN 0.4 MG SL SUBL
0.8000 mg | SUBLINGUAL_TABLET | Freq: Once | SUBLINGUAL | Status: AC
  Administered 2023-02-08: 0.8 mg via SUBLINGUAL

## 2023-02-08 MED ORDER — IOHEXOL 350 MG/ML SOLN
95.0000 mL | Freq: Once | INTRAVENOUS | Status: AC | PRN
  Administered 2023-02-08: 95 mL via INTRAVENOUS

## 2023-02-08 NOTE — Telephone Encounter (Signed)
Spoke with patient about recent lab work, understands to continue flecainide as prescribed. No questions or concerns at this time.

## 2023-02-08 NOTE — Telephone Encounter (Signed)
-----   Message from Maurice Small, MD sent at 02/08/2023  5:22 PM EDT ----- Renal function looks great. Ok to continue flecainide

## 2023-02-17 ENCOUNTER — Telehealth: Payer: Self-pay

## 2023-02-17 DIAGNOSIS — G35 Multiple sclerosis: Secondary | ICD-10-CM | POA: Diagnosis not present

## 2023-02-17 DIAGNOSIS — H26491 Other secondary cataract, right eye: Secondary | ICD-10-CM | POA: Diagnosis not present

## 2023-02-17 DIAGNOSIS — R9389 Abnormal findings on diagnostic imaging of other specified body structures: Secondary | ICD-10-CM

## 2023-02-17 NOTE — Telephone Encounter (Signed)
Called and spoke with patient about CT results. Patient will continue flecainide as prescribed currently and appreciated the referral to pulmonology. No questions at this time.

## 2023-02-17 NOTE — Telephone Encounter (Signed)
-----   Message from Maurice Small, MD sent at 02/16/2023  7:15 PM EDT ----- CT shows very mild CAD. I don't think this is an absolute contraindication to flecainide right now.  The CT showed edema vs infection or possibly interstitial lung disease. I'm not sure what this means, but I think we should refer her to pulmonology. ----- Message ----- From: Interface, Rad Results In Sent: 02/09/2023   1:11 PM EDT To: Maurice Small, MD

## 2023-02-23 ENCOUNTER — Encounter: Payer: Self-pay | Admitting: Internal Medicine

## 2023-02-23 ENCOUNTER — Ambulatory Visit: Payer: Medicare PPO | Admitting: Internal Medicine

## 2023-02-23 VITALS — BP 106/64 | HR 55 | Temp 98.0°F | Ht 66.0 in | Wt 128.8 lb

## 2023-02-23 DIAGNOSIS — R0982 Postnasal drip: Secondary | ICD-10-CM | POA: Diagnosis not present

## 2023-02-23 DIAGNOSIS — R9389 Abnormal findings on diagnostic imaging of other specified body structures: Secondary | ICD-10-CM
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
## 2023-02-23 NOTE — Patient Instructions (Addendum)
Please schedule follow up scheduled with myself in 3-4 weeks.  If my schedule is not open yet, we will contact you with a reminder closer to that time. Please call [phone] if you haven't heard from [place] a month before.   Before your next visit I would like you to have: CT Scan of the chest - our office will schedule this and call you.  Follow up with me afterwards - can be in person or with mychart video visit.   Can try flonase for the post nasal drainage.   Flonase - 1 spray on each side of your nose twice a day for first week, then 1 spray on each side.   Instructions for use: If you also use a saline nasal spray or rinse, use that first. Position the head with the chin slightly tucked. Use the right hand to spray into the left nostril and the right hand to spray into the left nostril.   Point the bottle away from the septum of your nose (cartilage that divides the two sides of your nose).  Hold the nostril closed on the opposite side from where you will spray Spray once and gently sniff to pull the medicine into the higher parts of your nose.  Don't sniff too hard as the medicine will drain down the back of your throat instead. Repeat with a second spray on the same side if prescribed. Repeat on the other side of your nose.

## 2023-03-02 DIAGNOSIS — G35 Multiple sclerosis: Secondary | ICD-10-CM | POA: Diagnosis not present

## 2023-03-02 DIAGNOSIS — Z961 Presence of intraocular lens: Secondary | ICD-10-CM | POA: Diagnosis not present

## 2023-03-02 DIAGNOSIS — H35342 Macular cyst, hole, or pseudohole, left eye: Secondary | ICD-10-CM | POA: Diagnosis not present

## 2023-03-07 ENCOUNTER — Ambulatory Visit
Admission: RE | Admit: 2023-03-07 | Discharge: 2023-03-07 | Disposition: A | Discharge status: Home / Self Care | Payer: Medicare PPO | Source: Ambulatory Visit | Attending: Internal Medicine | Admitting: Internal Medicine

## 2023-03-07 DIAGNOSIS — J479 Bronchiectasis, uncomplicated: Secondary | ICD-10-CM | POA: Diagnosis not present

## 2023-03-07 DIAGNOSIS — R9389 Abnormal findings on diagnostic imaging of other specified body structures: Secondary | ICD-10-CM | POA: Diagnosis not present

## 2023-03-17 ENCOUNTER — Ambulatory Visit: Payer: Medicare PPO | Attending: Physician Assistant | Admitting: Physician Assistant

## 2023-03-17 ENCOUNTER — Encounter: Payer: Self-pay | Admitting: Physician Assistant

## 2023-03-17 VITALS — BP 126/74 | HR 59 | Ht 66.0 in | Wt 131.6 lb

## 2023-03-17 DIAGNOSIS — R002 Palpitations: Secondary | ICD-10-CM | POA: Diagnosis not present

## 2023-03-17 DIAGNOSIS — Z5181 Encounter for therapeutic drug level monitoring: Secondary | ICD-10-CM | POA: Diagnosis not present

## 2023-03-17 DIAGNOSIS — I491 Atrial premature depolarization: Secondary | ICD-10-CM | POA: Diagnosis not present

## 2023-03-17 DIAGNOSIS — Z79899 Other long term (current) drug therapy: Secondary | ICD-10-CM | POA: Diagnosis not present

## 2023-03-17 NOTE — Patient Instructions (Addendum)
Medication Instructions:   Your physician recommends that you continue on your current medications as directed. Please refer to the Current Medication list given to you today.   *If you need a refill on your cardiac medications before your next appointment, please call your pharmacy*   Lab Work:  NONE ORDERED  TODAY     If you have labs (blood work) drawn today and your tests are completely normal, you will receive your results only by: MyChart Message (if you have MyChart) OR A paper copy in the mail If you have any lab test that is abnormal or we need to change your treatment, we will call you to review the results.   Testing/Procedures: NONE ORDERED  TODAY     Follow-Up: At [facility], you and your health needs are our priority.  As part of our continuing mission to provide you with exceptional heart care, we have created designated Provider Care Teams.  These Care Teams include your primary Cardiologist (physician) and Advanced Practice Providers (APPs -  Physician Assistants and Nurse Practitioners) who all work together to provide you with the care you need, when you need it.  We recommend signing up for the patient portal called "MyChart".  Sign up information is provided on this After Visit Summary.  MyChart is used to connect with patients for Virtual Visits (Telemedicine).  Patients are able to view lab/test results, encounter notes, upcoming appointments, etc.  Non-urgent messages can be sent to your provider as well.   To learn more about what you can do with MyChart, go to [email or web address].    Your next appointment:   4 month(s)  Provider:   Renee Ursuy, PA-C      Other Instructions   
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
## 2023-03-20 ENCOUNTER — Ambulatory Visit: Payer: Medicare PPO | Admitting: Internal Medicine

## 2023-03-28 ENCOUNTER — Other Ambulatory Visit: Payer: Self-pay | Admitting: Internal Medicine

## 2023-04-05 ENCOUNTER — Other Ambulatory Visit: Payer: Self-pay | Admitting: Family Medicine

## 2023-04-05 DIAGNOSIS — Z1231 Encounter for screening mammogram for malignant neoplasm of breast: Secondary | ICD-10-CM

## 2023-04-07 ENCOUNTER — Ambulatory Visit: Payer: Medicare PPO | Admitting: Internal Medicine

## 2023-04-14 ENCOUNTER — Telehealth: Payer: Self-pay | Admitting: Pharmacy Technician

## 2023-04-14 NOTE — Telephone Encounter (Addendum)
Auth Submission: approved Site of care: Site of care: [facility] Payer: [employer] MEDICARE Medication & CPT/J Code(s) submitted: Prolia (Denosumab) [id number] Route of submission (phone, fax, portal): [facility] Phone # Fax # Auth type: Buy/Bill Units/visits requested: X1 Reference number: TRANS ID: [id number] CUSTER ID: [id number] REF: [id number] Approval from: 04/14/23 - 10/10/23  Service provider updated.

## 2023-04-19 ENCOUNTER — Encounter: Payer: Self-pay | Admitting: Nurse Practitioner

## 2023-04-19 ENCOUNTER — Other Ambulatory Visit
Admission: RE | Admit: 2023-04-19 | Discharge: 2023-04-19 | Disposition: A | Discharge status: Home / Self Care | Payer: Medicare PPO | Source: Ambulatory Visit | Attending: Nurse Practitioner | Admitting: Nurse Practitioner

## 2023-04-19 ENCOUNTER — Ambulatory Visit: Payer: Medicare PPO | Admitting: Nurse Practitioner

## 2023-04-19 ENCOUNTER — Other Ambulatory Visit: Payer: Self-pay | Admitting: Nurse Practitioner

## 2023-04-19 VITALS — BP 118/72 | HR 58 | Temp 97.8°F | Ht 66.0 in | Wt 131.8 lb

## 2023-04-19 DIAGNOSIS — J3089 Other allergic rhinitis: Secondary | ICD-10-CM | POA: Insufficient documentation

## 2023-04-19 DIAGNOSIS — R053 Chronic cough: Secondary | ICD-10-CM

## 2023-04-19 DIAGNOSIS — J4530 Mild persistent asthma, uncomplicated: Secondary | ICD-10-CM

## 2023-04-19 DIAGNOSIS — R058 Other specified cough: Secondary | ICD-10-CM | POA: Insufficient documentation

## 2023-04-19 DIAGNOSIS — J398 Other specified diseases of upper respiratory tract: Secondary | ICD-10-CM

## 2023-04-19 DIAGNOSIS — J45909 Unspecified asthma, uncomplicated: Secondary | ICD-10-CM | POA: Insufficient documentation

## 2023-04-19 LAB — CBC WITH DIFFERENTIAL/PLATELET
Abs Immature Granulocytes: 0.02 10*3/uL (ref 0.00–0.07)
Basophils Absolute: 0.1 10*3/uL (ref 0.0–0.1)
Basophils Relative: 1 %
Eosinophils Absolute: 0.1 10*3/uL (ref 0.0–0.5)
Eosinophils Relative: 2 %
HCT: 39.9 % (ref 36.0–46.0)
Hemoglobin: 13.2 g/dL (ref 12.0–15.0)
Immature Granulocytes: 0 %
Lymphocytes Relative: 30 %
Lymphs Abs: 1.7 10*3/uL (ref 0.7–4.0)
MCH: 29.9 pg (ref 26.0–34.0)
MCHC: 33.1 g/dL (ref 30.0–36.0)
MCV: 90.3 fL (ref 80.0–100.0)
Monocytes Absolute: 0.4 10*3/uL (ref 0.1–1.0)
Monocytes Relative: 6 %
Neutro Abs: 3.5 10*3/uL (ref 1.7–7.7)
Neutrophils Relative %: 61 %
Platelets: 158 10*3/uL (ref 150–400)
RBC: 4.42 MIL/uL (ref 3.87–5.11)
RDW: 13.2 % (ref 11.5–15.5)
WBC: 5.8 10*3/uL (ref 4.0–10.5)
nRBC: 0 % (ref 0.0–0.2)

## 2023-04-19 LAB — NITRIC OXIDE: Nitric Oxide: 23

## 2023-04-19 MED ORDER — PREDNISONE 20 MG PO TABS
20.0000 mg | ORAL_TABLET | Freq: Every day | ORAL | 0 refills | Status: AC
Start: 2023-04-19 — End: 2023-04-24

## 2023-04-19 MED ORDER — QVAR REDIHALER 40 MCG/ACT IN AERB
2.0000 | INHALATION_SPRAY | Freq: Two times a day (BID) | RESPIRATORY_TRACT | 5 refills | Status: DC
Start: 2023-04-19 — End: 2023-04-24

## 2023-04-19 MED ORDER — BENZONATATE 200 MG PO CAPS
200.0000 mg | ORAL_CAPSULE | Freq: Three times a day (TID) | ORAL | 1 refills | Status: AC | PRN
Start: 2023-04-19 — End: ?

## 2023-04-19 NOTE — Patient Instructions (Addendum)
Continue flonase nasal spray 2 sprays each nostril daily  Prednisone 20 mg daily for 5 days. Take in AM with food. Albuterol inhaler 2 puffs every 6 hours as needed for shortness of breath or wheezing.  Qvar 2 puffs Twice daily. Brush tongue and rinse mouth afterwards Benzonatate 1 capsule Three times a day as needed for cough   Labs today   Pulmonary function testing ordered  Follow up in 6 weeks after PFTs with Dr. Celine Mans or Philis Nettle and to see how inhaler is working. If symptoms do not improve or worsen, please contact office for sooner follow up or seek emergency care.

## 2023-04-19 NOTE — Assessment & Plan Note (Signed)
See above. No overwhelming evidence of ILD on CT imaging. Possible upper airway component; on intranasal steroid. No significant reflux symptoms.

## 2023-04-19 NOTE — Progress Notes (Signed)
@Patient  ID: [name], female    DOB: 1951-03-02, 72 y.o.   MRN: [id number]  Chief Complaint  Patient presents with   Follow-up    CT 6/1- occ dry cough at times prod with clear to yellow sputum and wheezing with coughing spells occ.     Referring provider: Lupita Raider, MD  HPI: 72 year old female, never smoker referred for abnormal CT of the chest and chronic cough May 2024.  She saw Dr. Celine Mans.  Past medical history significant for multiple sclerosis and mild dementia.  TEST/EVENTS:  03/07/2023 HRCT chest: atherosclerosis. No LAD. Few patchy areas of very mild peripheral predominant ground glass attenuation and septal thickening. Some scatted areas of mild cylindrical btx. Highly non-specific. Mild air trapping. Partial collapse of trachea and bronchi.   02/23/2023: OV with Dr. Celine Mans for pulmonary consult.  Ongoing cough for the past 3 to 4 weeks.  Cough is nonproductive usually.  Occasionally will bring up some white mucus that she swallows.  She has shortness of breath associated with the cough.  Does get tightness in her chest.  Has not tried anything for the cough.  Worse in the morning.  Does keep her from falling asleep.  Denies any recurrent history of pneumonia or bronchitis.  Denies heartburn.  Does have some postnasal drainage.  Regarding MS, has some issues with mobility and gait.  Occasional jerking movements.  Ophthalmic manifestations.  On biotin.  Coronary CT showed nonspecific interstitial prominence.  HRCT chest ordered for further evaluation to rule out interstitial lung disease which showed very subtle/mild changes including some upper lobe predominant mild cylindrical bronchiectasis.  Considered highly nonspecific and felt to unlikely be related to a progressive lung disorder.  04/19/2023: Today-follow-up Patient resents today for follow-up.  She had high-resolution CT scan which did not show any significant evidence of progressive interstitial lung disease. She  continues to have a persistent cough, which is occasionally productive with clear to yellow phlegm.  She also continues to have shortness of breath. Notices it more in the mornings and evenings. It has woken her up at night before but not on a regular basis. She feels like the SOB is very mild. No wheezing. Does not notice that she feels much different compared to her last visit. She does think the flonase has helped her nasal congestion and postnasal drip. She denies any fevers, chills, hemoptysis, night sweats, leg swelling, orthopnea. She has not tried any inhalers. She does have seasonal allergies which she is not currently taking anything for.   Allergies  Allergen Reactions   Statins Anaphylaxis   Celebrex [Celecoxib] Itching and Swelling    Throat swelling   Red Yeast Rice [Cholestin] Itching and Swelling    Throat swelling   Amoxicillin Diarrhea   Citalopram     Memory issues   Codeine Nausea And Vomiting   Monascus Purpureus Went Yeast Other (See Comments)   Morphine     crying   Reclast [Zoledronic Acid] Other (See Comments)    Chest tightness   Sulfa Antibiotics Itching   Tramadol     LACK OF EFFECT   Zocor [Simvastatin] Itching and Swelling    Throat swelling   Achromycin [Tetracycline] Hives   Carafate [Sucralfate] Rash   Codeine Sulfate Nausea And Vomiting   Colesevelam Nausea And Vomiting   Welchol [Colesevelam Hcl] Swelling    Immunization History  Administered Date(s) Administered   COVID-19, mRNA, vaccine(Comirnaty)12 years and older 10/26/2022   Fluad Quad(high Dose 65+) 07/08/2019  Influenza, High Dose Seasonal PF 07/10/2022   Influenza,inj,Quad PF,6+ Mos 07/06/2016   PFIZER(Purple Top)SARS-COV-2 Vaccination 12/05/2019, 12/26/2019, 10/07/2020    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     Beta Carotene (VITAMIN A) 25000 UNIT capsule Take 25,000 Units by mouth daily.     Cholecalciferol (VITAMIN D PO) Take 1,000 mg by mouth daily.     Coenzyme Q10 (COQ10 PO) Take by mouth daily.     Docosahexaenoic Acid (DHA PO) Take 1,660 mg by mouth daily.     DULoxetine (CYMBALTA) 20 MG capsule Take 20 mg by mouth daily.     ezetimibe (ZETIA) 10 MG tablet Take 10 mg by mouth daily.     flecainide (TAMBOCOR) 50 MG tablet TAKE 1 TABLET BY MOUTH TWICE A DAY 180 tablet 3   IBUPROFEN PO Take by mouth daily as needed.     meloxicam (MOBIC) 15 MG tablet Take 7.5 mg by mouth 2 (two) times daily.     OLANZapine (ZYPREXA) 2.5 MG tablet Take 2.5 mg by mouth 2 (two) times daily as needed.     Omega-3 Fatty Acids (FISH OIL) 1200 MG CAPS Take 2 capsules by mouth 2 (two) times daily.     pravastatin (PRAVACHOL) 20 MG tablet Take 20 mg by mouth daily.     pregabalin (LYRICA) 75 MG capsule Take 75 mg by mouth 3 (three) times daily.     propranolol ER (INDERAL LA) 60 MG 24 hr capsule TAKE 1 CAPSULE BY MOUTH EVERY DAY 90 capsule 3   rivastigmine (EXELON) 1.5 MG capsule Take 1.5 mg by mouth daily.     Insulin Pen Needle 31G X 5 MM MISC Use to inject Forteo once daily. (Patient not taking: Reported on 04/19/2023) 100 each 5   No facility-administered medications prior to visit.     Review of Systems:   Constitutional: No weight loss or gain, night sweats, fevers, chills,or  lassitude. +chronic fatigue  HEENT: No headaches, difficulty swallowing, tooth/dental problems, or sore throat. No sneezing, itching, ear ache. +improved nasal congestion, post nasal drip CV:  No chest pain, orthopnea, PND, swelling in lower extremities, anasarca, dizziness, palpitations, syncope Resp: +minimal shortness of breath with exertion; minimally productive cough. No excess mucus or change in color of mucus. No hemoptysis. No wheezing.  No chest wall deformity GI:  No heartburn, indigestion GU: No dysuria, change in color of urine, urgency or frequency. Skin: No rash, lesions, ulcerations MSK:  No increased joint pain or swelling.   Neuro: No dizziness or lightheadedness.  Psych: No depression or anxiety. Mood stable.     Physical Exam:  BP 118/72 (BP Location: Left Arm, Cuff Size: Normal)   Pulse (!) 58  Temp 97.8 F (36.6 C) (Temporal)   Ht 5\' 6"  (1.676 m)   Wt 131 lb 12.8 oz (59.8 kg)   LMP  (LMP Unknown)   SpO2 97%   BMI 21.27 kg/m   GEN: Pleasant, interactive, well-appearing; in no acute distress. HEENT:  Normocephalic and atraumatic. PERRLA. Sclera white. Nasal turbinates pink, moist and patent bilaterally. No rhinorrhea present. Oropharynx pink and moist, without exudate or edema. No lesions, ulcerations, or postnasal drip.  NECK:  Supple w/ fair ROM. No JVD present. Normal carotid impulses w/o bruits. Thyroid symmetrical with no goiter or nodules palpated. No lymphadenopathy.   CV: RRR, no m/r/g, no peripheral edema. Pulses intact, +2 bilaterally. No cyanosis, pallor or clubbing. PULMONARY:  Unlabored, regular breathing. Clear bilaterally A&P w/o wheezes/rales/rhonchi. No accessory muscle use.  GI: BS present and normoactive. Soft, non-tender to palpation. No organomegaly or masses detected.  MSK: No erythema, warmth or tenderness. Cap refil <2 sec all extrem. No deformities or joint swelling noted.  Neuro: A/Ox3. No focal deficits noted.   Skin: Warm, no lesions  or rashe Psych: Normal affect and behavior. Judgement and thought content appropriate.     Lab Results:  CBC    Component Value Date/Time   WBC 5.8 04/19/2023 1144   RBC 4.42 04/19/2023 1144   HGB 13.2 04/19/2023 1144   HGB 12.2 09/20/2019 1052   HCT 39.9 04/19/2023 1144   HCT 36.3 09/20/2019 1052   PLT 158 04/19/2023 1144   PLT 183 09/20/2019 1052   MCV 90.3 04/19/2023 1144   MCV 91 09/20/2019 1052   MCH 29.9 04/19/2023 1144   MCHC 33.1 04/19/2023 1144   RDW 13.2 04/19/2023 1144   RDW 12.8 09/20/2019 1052   LYMPHSABS 1.7 04/19/2023 1144   MONOABS 0.4 04/19/2023 1144   EOSABS 0.1 04/19/2023 1144   BASOSABS 0.1 04/19/2023 1144    BMET    Component Value Date/Time   NA 145 (H) 02/02/2023 1001   K 4.5 02/02/2023 1001   CL 107 (H) 02/02/2023 1001   CO2 24 02/02/2023 1001   GLUCOSE 89 02/02/2023 1001   GLUCOSE 78 11/09/2022 1343   BUN 20 02/02/2023 1001   CREATININE 0.69 02/02/2023 1001   CREATININE 0.70 11/09/2022 1343   CALCIUM 9.3 02/02/2023 1001   GFRNONAA >60 02/07/2022 1540   GFRNONAA 67 12/30/2020 0901   GFRAA 78 12/30/2020 0901    BNP No results found for: "BNP"   Imaging:  No results found.        No data to display          Lab Results  Component Value Date   NITRICOXIDE 23 04/19/2023        Assessment & Plan:   Reactive airway disease Question component of reactive airway disease. FeNO borderline nl today. She had evidence of air trapping on HRCT. Will challenge her with prednisone burst and ICS. Provided with SABA for rescue. Medication education provided. PFTs ordered for further evaluation. Action plan in place. Assess peripheral eosinophils and for allergic component.   Patient Instructions  Continue flonase nasal spray 2 sprays each nostril daily  Prednisone 20 mg daily for 5 days. Take in AM with food. Albuterol inhaler 2 puffs every 6 hours as needed for shortness of breath or wheezing.  Qvar 2 puffs Twice daily. Brush  tongue and rinse mouth afterwards Benzonatate 1 capsule Three times a day as needed for cough   Labs today   Pulmonary function testing ordered  Follow up in  6 weeks after PFTs with Dr. Celine Mans or Philis Nettle and to see how inhaler is working. If symptoms do not improve or worsen, please contact office for sooner follow up or seek emergency care.    Chronic cough See above. No overwhelming evidence of ILD on CT imaging. Possible upper airway component; on intranasal steroid. No significant reflux symptoms.   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 May consider further evaluation for OSA at follow up.    I spent 35 minutes of dedicated to the care of this patient on the date of this encounter to include pre-visit review of records, face-to-face time with the patient discussing conditions above, post visit ordering of testing, clinical documentation with the electronic health record, making appropriate referrals as documented, and communicating necessary findings to members of the patients care team.  Noemi Chapel, NP 04/19/2023  Pt aware and understands NP's role.

## 2023-04-19 NOTE — Assessment & Plan Note (Signed)
Very mild on imaging. May consider further evaluation for OSA at follow up.

## 2023-04-19 NOTE — Assessment & Plan Note (Addendum)
Question component of reactive airway disease. FeNO borderline nl today. She had evidence of air trapping on HRCT. Will challenge her with prednisone burst and ICS. Provided with SABA for rescue. Medication education provided. PFTs ordered for further evaluation. Action plan in place. Assess peripheral eosinophils and for allergic component.   Patient Instructions  Continue flonase nasal spray 2 sprays each nostril daily  Prednisone 20 mg daily for 5 days. Take in AM with food. Albuterol inhaler 2 puffs every 6 hours as needed for shortness of breath or wheezing.  Qvar 2 puffs Twice daily. Brush tongue and rinse mouth afterwards Benzonatate 1 capsule Three times a day as needed for cough   Labs today   Pulmonary function testing ordered  Follow up in 6 weeks after PFTs with Dr. Celine Mans or Philis Nettle and to see how inhaler is working. If symptoms do not improve or worsen, please contact office for sooner follow up or seek emergency care.

## 2023-04-20 ENCOUNTER — Telehealth: Payer: Self-pay

## 2023-04-20 ENCOUNTER — Other Ambulatory Visit: Payer: Self-pay | Admitting: Pharmacist

## 2023-04-20 DIAGNOSIS — Z5181 Encounter for therapeutic drug level monitoring: Secondary | ICD-10-CM

## 2023-04-20 DIAGNOSIS — M810 Age-related osteoporosis without current pathological fracture: Secondary | ICD-10-CM

## 2023-04-20 NOTE — Progress Notes (Signed)
Patient scheduled for Prolia on 05/01/23. Will need updated CMET prior to starting Prolia. Future order for CMET placed today. They will stop by on Monday for blood work  Chesley Mires, PharmD, MPH, BCPS, CPP Clinical Pharmacist (Rheumatology and Pulmonology)

## 2023-04-20 NOTE — Telephone Encounter (Signed)
Patient's husband, Annette Stable called and advised patient is scheduled for a prolia injection on 04/24/2023 but was prescribed albuterol inhaler, tessalon and prednisone yesterday by pulm. He denied patient having an active infection. He was concerned about these medications and patient receiving the prolia injection. I spoke with [facility], PharmD and advised patient they could defer the injection until after completing the prednisone rx. He verbalized understanding and will call to reschedule the injection.

## 2023-04-20 NOTE — Telephone Encounter (Signed)
Patient has r/s Prolia appt to 05/01/23  Chesley Mires, PharmD, MPH, BCPS, CPP Clinical Pharmacist (Rheumatology and Pulmonology)

## 2023-04-21 LAB — IGE: IgE (Immunoglobulin E), Serum: 55 IU/mL (ref 6–495)

## 2023-04-24 ENCOUNTER — Other Ambulatory Visit: Payer: Self-pay | Admitting: *Deleted

## 2023-04-24 ENCOUNTER — Other Ambulatory Visit (HOSPITAL_COMMUNITY): Payer: Self-pay

## 2023-04-24 ENCOUNTER — Telehealth: Payer: Self-pay | Admitting: Nurse Practitioner

## 2023-04-24 ENCOUNTER — Ambulatory Visit: Payer: Medicare PPO

## 2023-04-24 DIAGNOSIS — F0390 Unspecified dementia without behavioral disturbance: Secondary | ICD-10-CM | POA: Diagnosis not present

## 2023-04-24 DIAGNOSIS — M810 Age-related osteoporosis without current pathological fracture: Secondary | ICD-10-CM | POA: Diagnosis not present

## 2023-04-24 DIAGNOSIS — Z5181 Encounter for therapeutic drug level monitoring: Secondary | ICD-10-CM | POA: Diagnosis not present

## 2023-04-24 DIAGNOSIS — J45909 Unspecified asthma, uncomplicated: Secondary | ICD-10-CM | POA: Diagnosis not present

## 2023-04-24 DIAGNOSIS — F325 Major depressive disorder, single episode, in full remission: Secondary | ICD-10-CM | POA: Diagnosis not present

## 2023-04-24 DIAGNOSIS — J4530 Mild persistent asthma, uncomplicated: Secondary | ICD-10-CM

## 2023-04-24 DIAGNOSIS — G35 Multiple sclerosis: Secondary | ICD-10-CM | POA: Diagnosis not present

## 2023-04-24 DIAGNOSIS — F411 Generalized anxiety disorder: Secondary | ICD-10-CM | POA: Diagnosis not present

## 2023-04-24 DIAGNOSIS — E782 Mixed hyperlipidemia: Secondary | ICD-10-CM | POA: Diagnosis not present

## 2023-04-24 MED ORDER — QVAR REDIHALER 40 MCG/ACT IN AERB
2.0000 | INHALATION_SPRAY | Freq: Two times a day (BID) | RESPIRATORY_TRACT | 5 refills | Status: DC
Start: 2023-04-24 — End: 2023-06-16

## 2023-04-24 NOTE — Telephone Encounter (Signed)
Bill husband states pharmacy does not have RX for Qvar. Pharmacy is [facility] [address] [address] Bill phone number is [phone].

## 2023-04-24 NOTE — Telephone Encounter (Signed)
Arnuity is preferred per test claims- $40.00 co-pay at this time.

## 2023-04-24 NOTE — Telephone Encounter (Signed)
Qvar resent to [facility].  Patient's husband, Bill(DPR) is aware and voiced his understanding.  Nothing further needed.

## 2023-04-25 LAB — COMPREHENSIVE METABOLIC PANEL
AG Ratio: 1.8 (calc) (ref 1.0–2.5)
ALT: 25 U/L (ref 6–29)
AST: 33 U/L (ref 10–35)
Albumin: 4.2 g/dL (ref 3.6–5.1)
Alkaline phosphatase (APISO): 55 U/L (ref 37–153)
BUN: 21 mg/dL (ref 7–25)
CO2: 30 mmol/L (ref 20–32)
Calcium: 9.5 mg/dL (ref 8.6–10.4)
Chloride: 106 mmol/L (ref 98–110)
Creat: 0.81 mg/dL (ref 0.50–1.05)
Globulin: 2.3 g/dL (calc) (ref 1.9–3.7)
Glucose, Bld: 127 mg/dL — ABNORMAL HIGH (ref 65–99)
Potassium: 4.3 mmol/L (ref 3.5–5.3)
Sodium: 143 mmol/L (ref 135–146)
Total Bilirubin: 0.7 mg/dL (ref 0.2–1.2)
Total Protein: 6.5 g/dL (ref 6.1–8.1)

## 2023-04-25 NOTE — Progress Notes (Signed)
CMET wnl to processed with Prolia.  Chesley Mires, PharmD, MPH, BCPS, CPP Clinical Pharmacist (Rheumatology and Pulmonology)

## 2023-04-26 DIAGNOSIS — K219 Gastro-esophageal reflux disease without esophagitis: Secondary | ICD-10-CM | POA: Diagnosis not present

## 2023-04-26 DIAGNOSIS — G35 Multiple sclerosis: Secondary | ICD-10-CM | POA: Diagnosis not present

## 2023-04-26 DIAGNOSIS — F0393 Unspecified dementia, unspecified severity, with mood disturbance: Secondary | ICD-10-CM | POA: Diagnosis not present

## 2023-04-26 DIAGNOSIS — E782 Mixed hyperlipidemia: Secondary | ICD-10-CM | POA: Diagnosis not present

## 2023-04-26 DIAGNOSIS — H547 Unspecified visual loss: Secondary | ICD-10-CM | POA: Diagnosis not present

## 2023-04-26 DIAGNOSIS — G4700 Insomnia, unspecified: Secondary | ICD-10-CM | POA: Diagnosis not present

## 2023-04-26 DIAGNOSIS — F322 Major depressive disorder, single episode, severe without psychotic features: Secondary | ICD-10-CM | POA: Diagnosis not present

## 2023-04-26 DIAGNOSIS — F0394 Unspecified dementia, unspecified severity, with anxiety: Secondary | ICD-10-CM | POA: Diagnosis not present

## 2023-04-26 DIAGNOSIS — F411 Generalized anxiety disorder: Secondary | ICD-10-CM | POA: Diagnosis not present

## 2023-04-28 ENCOUNTER — Ambulatory Visit
Admission: RE | Admit: 2023-04-28 | Discharge: 2023-04-28 | Disposition: A | Discharge status: Home / Self Care | Payer: Medicare PPO | Source: Ambulatory Visit | Attending: Family Medicine | Admitting: Family Medicine

## 2023-04-28 DIAGNOSIS — Z1231 Encounter for screening mammogram for malignant neoplasm of breast: Secondary | ICD-10-CM | POA: Diagnosis not present

## 2023-05-01 ENCOUNTER — Ambulatory Visit (INDEPENDENT_AMBULATORY_CARE_PROVIDER_SITE_OTHER): Payer: Medicare PPO

## 2023-05-01 VITALS — BP 101/65 | HR 60 | Temp 98.4°F | Resp 20 | Wt 133.6 lb

## 2023-05-01 DIAGNOSIS — M810 Age-related osteoporosis without current pathological fracture: Secondary | ICD-10-CM | POA: Diagnosis not present

## 2023-05-01 MED ORDER — DENOSUMAB 60 MG/ML ~~LOC~~ SOSY
60.0000 mg | PREFILLED_SYRINGE | Freq: Once | SUBCUTANEOUS | Status: AC
  Administered 2023-05-01: 60 mg via SUBCUTANEOUS
  Filled 2023-05-01: qty 1

## 2023-05-01 NOTE — Patient Instructions (Signed)
Denosumab Injection (Osteoporosis) What is this medication? DENOSUMAB (den oh SUE mab) prevents and treats osteoporosis. It works by making your bones stronger and less likely to break (fracture). It is a monoclonal antibody. This medicine may be used for other purposes; ask your health care provider or pharmacist if you have questions. COMMON BRAND NAME(S): Prolia What should I tell my care team before I take this medication? They need to know if you have any of these conditions: Dental or gum disease Had thyroid or parathyroid (glands located in neck) surgery Having dental surgery or a tooth pulled Kidney disease Low levels of calcium in the blood On dialysis Poor nutrition Thyroid disease Trouble absorbing nutrients from your food An unusual or allergic reaction to denosumab, other medications, foods, dyes, or preservatives Pregnant or trying to get pregnant Breastfeeding How should I use this medication? This medication is injected under the skin. It is given by your care team in a hospital or clinic setting. A special MedGuide will be given to you before each treatment. Be sure to read this information carefully each time. Talk to your care team about the use of this medication in children. Special care may be needed. Overdosage: If you think you have taken too much of this medicine contact a poison control center or emergency room at once. NOTE: This medicine is only for you. Do not share this medicine with others. What if I miss a dose? Keep appointments for follow-up doses. It is important not to miss your dose. Call your care team if you are unable to keep an appointment. What may interact with this medication? Do not take this medication with any of the following: Other medications that contain denosumab This medication may also interact with the following: Medications that lower your chance of fighting infection Steroid medications, such as prednisone or cortisone This  list may not describe all possible interactions. Give your health care provider a list of all the medicines, herbs, non-prescription drugs, or dietary supplements you use. Also tell them if you smoke, drink alcohol, or use illegal drugs. Some items may interact with your medicine. What should I watch for while using this medication? Your condition will be monitored carefully while you are receiving this medication. You may need blood work done while taking this medication. This medication may increase your risk of getting an infection. Call your care team for advice if you get a fever, chills, sore throat, or other symptoms of a cold or flu. Do not treat yourself. Try to avoid being around people who are sick. Tell your dentist and dental surgeon that you are taking this medication. You should not have major dental surgery while on this medication. See your dentist to have a dental exam and fix any dental problems before starting this medication. Take good care of your teeth while on this medication. Make sure you see your dentist for regular follow-up appointments. This medication may cause low levels of calcium in your body. The risk of severe side effects is increased in people with kidney disease. Your care team may prescribe calcium and vitamin D to help prevent low calcium levels while you take this medication. It is important to take calcium and vitamin D as directed by your care team. Talk to your care team if you may be pregnant. Serious birth defects may occur if you take this medication during pregnancy and for 5 months after the last dose. You will need a negative pregnancy test before starting this medication. Contraception   is recommended while taking this medication and for 5 months after the last dose. Your care team can help you find the option that works for you. Talk to your care team before breastfeeding. Changes to your treatment plan may be needed. What side effects may I notice from  receiving this medication? Side effects that you should report to your care team as soon as possible: Allergic reactions--skin rash, itching, hives, swelling of the face, lips, tongue, or throat Infection--fever, chills, cough, sore throat, wounds that don't heal, pain or trouble when passing urine, general feeling of discomfort or being unwell Low calcium level--muscle pain or cramps, confusion, tingling, or numbness in the hands or feet Osteonecrosis of the jaw--pain, swelling, or redness in the mouth, numbness of the jaw, poor healing after dental work, unusual discharge from the mouth, visible bones in the mouth Severe bone, joint, or muscle pain Skin infection--skin redness, swelling, warmth, or pain Side effects that usually do not require medical attention (report these to your care team if they continue or are bothersome): Back pain Headache Joint pain Muscle pain Pain in the hands, arms, legs, or feet Runny or stuffy nose Sore throat This list may not describe all possible side effects. Call your doctor for medical advice about side effects. You may report side effects to FDA at 1-800-FDA-1088. Where should I keep my medication? This medication is given in a hospital or clinic. It will not be stored at home. NOTE: This sheet is a summary. It may not cover all possible information. If you have questions about this medicine, talk to your doctor, pharmacist, or health care provider.  2024 Elsevier/Gold Standard (2022-11-01 00:00:00)  

## 2023-05-01 NOTE — Progress Notes (Signed)
Diagnosis: Osteoporosis  Provider:  Chilton Greathouse MD  Procedure: Injection  Prolia (Denosumab), Dose: 60 mg, Site: subcutaneous, Number of injections: 1  Post Care: Observation period completed  Discharge: Condition: Good, Destination: Home . AVS Declined  Performed by:  Rico Ala, LPN

## 2023-05-02 DIAGNOSIS — F411 Generalized anxiety disorder: Secondary | ICD-10-CM | POA: Diagnosis not present

## 2023-05-02 DIAGNOSIS — F0393 Unspecified dementia, unspecified severity, with mood disturbance: Secondary | ICD-10-CM | POA: Diagnosis not present

## 2023-05-02 DIAGNOSIS — G4700 Insomnia, unspecified: Secondary | ICD-10-CM | POA: Diagnosis not present

## 2023-05-02 DIAGNOSIS — F322 Major depressive disorder, single episode, severe without psychotic features: Secondary | ICD-10-CM | POA: Diagnosis not present

## 2023-05-02 DIAGNOSIS — E782 Mixed hyperlipidemia: Secondary | ICD-10-CM | POA: Diagnosis not present

## 2023-05-02 DIAGNOSIS — F0394 Unspecified dementia, unspecified severity, with anxiety: Secondary | ICD-10-CM | POA: Diagnosis not present

## 2023-05-02 DIAGNOSIS — G35 Multiple sclerosis: Secondary | ICD-10-CM | POA: Diagnosis not present

## 2023-05-02 DIAGNOSIS — H547 Unspecified visual loss: Secondary | ICD-10-CM | POA: Diagnosis not present

## 2023-05-02 DIAGNOSIS — K219 Gastro-esophageal reflux disease without esophagitis: Secondary | ICD-10-CM | POA: Diagnosis not present

## 2023-05-04 DIAGNOSIS — H547 Unspecified visual loss: Secondary | ICD-10-CM | POA: Diagnosis not present

## 2023-05-04 DIAGNOSIS — E782 Mixed hyperlipidemia: Secondary | ICD-10-CM | POA: Diagnosis not present

## 2023-05-04 DIAGNOSIS — G35 Multiple sclerosis: Secondary | ICD-10-CM | POA: Diagnosis not present

## 2023-05-04 DIAGNOSIS — G4700 Insomnia, unspecified: Secondary | ICD-10-CM | POA: Diagnosis not present

## 2023-05-04 DIAGNOSIS — F322 Major depressive disorder, single episode, severe without psychotic features: Secondary | ICD-10-CM | POA: Diagnosis not present

## 2023-05-04 DIAGNOSIS — F0394 Unspecified dementia, unspecified severity, with anxiety: Secondary | ICD-10-CM | POA: Diagnosis not present

## 2023-05-04 DIAGNOSIS — F411 Generalized anxiety disorder: Secondary | ICD-10-CM | POA: Diagnosis not present

## 2023-05-04 DIAGNOSIS — K219 Gastro-esophageal reflux disease without esophagitis: Secondary | ICD-10-CM | POA: Diagnosis not present

## 2023-05-04 DIAGNOSIS — F0393 Unspecified dementia, unspecified severity, with mood disturbance: Secondary | ICD-10-CM | POA: Diagnosis not present

## 2023-05-12 ENCOUNTER — Other Ambulatory Visit: Payer: Self-pay | Admitting: Internal Medicine

## 2023-05-12 DIAGNOSIS — F0394 Unspecified dementia, unspecified severity, with anxiety: Secondary | ICD-10-CM | POA: Diagnosis not present

## 2023-05-12 DIAGNOSIS — G4700 Insomnia, unspecified: Secondary | ICD-10-CM | POA: Diagnosis not present

## 2023-05-12 DIAGNOSIS — E782 Mixed hyperlipidemia: Secondary | ICD-10-CM | POA: Diagnosis not present

## 2023-05-12 DIAGNOSIS — F322 Major depressive disorder, single episode, severe without psychotic features: Secondary | ICD-10-CM | POA: Diagnosis not present

## 2023-05-12 DIAGNOSIS — K219 Gastro-esophageal reflux disease without esophagitis: Secondary | ICD-10-CM | POA: Diagnosis not present

## 2023-05-12 DIAGNOSIS — F411 Generalized anxiety disorder: Secondary | ICD-10-CM | POA: Diagnosis not present

## 2023-05-12 DIAGNOSIS — H547 Unspecified visual loss: Secondary | ICD-10-CM | POA: Diagnosis not present

## 2023-05-12 DIAGNOSIS — F0393 Unspecified dementia, unspecified severity, with mood disturbance: Secondary | ICD-10-CM | POA: Diagnosis not present

## 2023-05-12 DIAGNOSIS — G35 Multiple sclerosis: Secondary | ICD-10-CM | POA: Diagnosis not present

## 2023-05-17 DIAGNOSIS — F411 Generalized anxiety disorder: Secondary | ICD-10-CM | POA: Diagnosis not present

## 2023-05-17 DIAGNOSIS — H547 Unspecified visual loss: Secondary | ICD-10-CM | POA: Diagnosis not present

## 2023-05-17 DIAGNOSIS — F0394 Unspecified dementia, unspecified severity, with anxiety: Secondary | ICD-10-CM | POA: Diagnosis not present

## 2023-05-17 DIAGNOSIS — G35 Multiple sclerosis: Secondary | ICD-10-CM | POA: Diagnosis not present

## 2023-05-17 DIAGNOSIS — E782 Mixed hyperlipidemia: Secondary | ICD-10-CM | POA: Diagnosis not present

## 2023-05-17 DIAGNOSIS — F0393 Unspecified dementia, unspecified severity, with mood disturbance: Secondary | ICD-10-CM | POA: Diagnosis not present

## 2023-05-17 DIAGNOSIS — K219 Gastro-esophageal reflux disease without esophagitis: Secondary | ICD-10-CM | POA: Diagnosis not present

## 2023-05-17 DIAGNOSIS — F322 Major depressive disorder, single episode, severe without psychotic features: Secondary | ICD-10-CM | POA: Diagnosis not present

## 2023-05-17 DIAGNOSIS — G4700 Insomnia, unspecified: Secondary | ICD-10-CM | POA: Diagnosis not present

## 2023-05-24 DIAGNOSIS — K219 Gastro-esophageal reflux disease without esophagitis: Secondary | ICD-10-CM | POA: Diagnosis not present

## 2023-05-24 DIAGNOSIS — E782 Mixed hyperlipidemia: Secondary | ICD-10-CM | POA: Diagnosis not present

## 2023-05-24 DIAGNOSIS — G35 Multiple sclerosis: Secondary | ICD-10-CM | POA: Diagnosis not present

## 2023-05-24 DIAGNOSIS — H547 Unspecified visual loss: Secondary | ICD-10-CM | POA: Diagnosis not present

## 2023-05-24 DIAGNOSIS — F0394 Unspecified dementia, unspecified severity, with anxiety: Secondary | ICD-10-CM | POA: Diagnosis not present

## 2023-05-24 DIAGNOSIS — F322 Major depressive disorder, single episode, severe without psychotic features: Secondary | ICD-10-CM | POA: Diagnosis not present

## 2023-05-24 DIAGNOSIS — G4700 Insomnia, unspecified: Secondary | ICD-10-CM | POA: Diagnosis not present

## 2023-05-24 DIAGNOSIS — F411 Generalized anxiety disorder: Secondary | ICD-10-CM | POA: Diagnosis not present

## 2023-05-24 DIAGNOSIS — F0393 Unspecified dementia, unspecified severity, with mood disturbance: Secondary | ICD-10-CM | POA: Diagnosis not present

## 2023-05-26 DIAGNOSIS — F0393 Unspecified dementia, unspecified severity, with mood disturbance: Secondary | ICD-10-CM | POA: Diagnosis not present

## 2023-05-26 DIAGNOSIS — G35 Multiple sclerosis: Secondary | ICD-10-CM | POA: Diagnosis not present

## 2023-05-26 DIAGNOSIS — H547 Unspecified visual loss: Secondary | ICD-10-CM | POA: Diagnosis not present

## 2023-05-26 DIAGNOSIS — F411 Generalized anxiety disorder: Secondary | ICD-10-CM | POA: Diagnosis not present

## 2023-05-26 DIAGNOSIS — E782 Mixed hyperlipidemia: Secondary | ICD-10-CM | POA: Diagnosis not present

## 2023-05-26 DIAGNOSIS — K219 Gastro-esophageal reflux disease without esophagitis: Secondary | ICD-10-CM | POA: Diagnosis not present

## 2023-05-26 DIAGNOSIS — F0394 Unspecified dementia, unspecified severity, with anxiety: Secondary | ICD-10-CM | POA: Diagnosis not present

## 2023-05-26 DIAGNOSIS — G4700 Insomnia, unspecified: Secondary | ICD-10-CM | POA: Diagnosis not present

## 2023-05-26 DIAGNOSIS — F322 Major depressive disorder, single episode, severe without psychotic features: Secondary | ICD-10-CM | POA: Diagnosis not present

## 2023-05-31 DIAGNOSIS — E782 Mixed hyperlipidemia: Secondary | ICD-10-CM | POA: Diagnosis not present

## 2023-05-31 DIAGNOSIS — H547 Unspecified visual loss: Secondary | ICD-10-CM | POA: Diagnosis not present

## 2023-05-31 DIAGNOSIS — F0394 Unspecified dementia, unspecified severity, with anxiety: Secondary | ICD-10-CM | POA: Diagnosis not present

## 2023-05-31 DIAGNOSIS — F322 Major depressive disorder, single episode, severe without psychotic features: Secondary | ICD-10-CM | POA: Diagnosis not present

## 2023-05-31 DIAGNOSIS — G35 Multiple sclerosis: Secondary | ICD-10-CM | POA: Diagnosis not present

## 2023-05-31 DIAGNOSIS — F411 Generalized anxiety disorder: Secondary | ICD-10-CM | POA: Diagnosis not present

## 2023-05-31 DIAGNOSIS — K219 Gastro-esophageal reflux disease without esophagitis: Secondary | ICD-10-CM | POA: Diagnosis not present

## 2023-05-31 DIAGNOSIS — F0393 Unspecified dementia, unspecified severity, with mood disturbance: Secondary | ICD-10-CM | POA: Diagnosis not present

## 2023-05-31 DIAGNOSIS — G4700 Insomnia, unspecified: Secondary | ICD-10-CM | POA: Diagnosis not present

## 2023-06-05 DIAGNOSIS — G301 Alzheimer's disease with late onset: Secondary | ICD-10-CM | POA: Diagnosis not present

## 2023-06-05 DIAGNOSIS — F02A0 Dementia in other diseases classified elsewhere, mild, without behavioral disturbance, psychotic disturbance, mood disturbance, and anxiety: Secondary | ICD-10-CM | POA: Diagnosis not present

## 2023-06-05 DIAGNOSIS — G35 Multiple sclerosis: Secondary | ICD-10-CM | POA: Diagnosis not present

## 2023-06-06 DIAGNOSIS — F0394 Unspecified dementia, unspecified severity, with anxiety: Secondary | ICD-10-CM | POA: Diagnosis not present

## 2023-06-06 DIAGNOSIS — E782 Mixed hyperlipidemia: Secondary | ICD-10-CM | POA: Diagnosis not present

## 2023-06-06 DIAGNOSIS — F322 Major depressive disorder, single episode, severe without psychotic features: Secondary | ICD-10-CM | POA: Diagnosis not present

## 2023-06-06 DIAGNOSIS — F411 Generalized anxiety disorder: Secondary | ICD-10-CM | POA: Diagnosis not present

## 2023-06-06 DIAGNOSIS — G35 Multiple sclerosis: Secondary | ICD-10-CM | POA: Diagnosis not present

## 2023-06-06 DIAGNOSIS — H547 Unspecified visual loss: Secondary | ICD-10-CM | POA: Diagnosis not present

## 2023-06-06 DIAGNOSIS — K219 Gastro-esophageal reflux disease without esophagitis: Secondary | ICD-10-CM | POA: Diagnosis not present

## 2023-06-06 DIAGNOSIS — F0393 Unspecified dementia, unspecified severity, with mood disturbance: Secondary | ICD-10-CM | POA: Diagnosis not present

## 2023-06-06 DIAGNOSIS — G4700 Insomnia, unspecified: Secondary | ICD-10-CM | POA: Diagnosis not present

## 2023-06-13 ENCOUNTER — Ambulatory Visit: Payer: Medicare PPO | Attending: Nurse Practitioner

## 2023-06-13 DIAGNOSIS — R059 Cough, unspecified: Secondary | ICD-10-CM | POA: Diagnosis not present

## 2023-06-13 DIAGNOSIS — R058 Other specified cough: Secondary | ICD-10-CM

## 2023-06-13 LAB — PULMONARY FUNCTION TEST ARMC ONLY
DL/VA % pred: 90 %
DL/VA: 3.7 ml/min/mmHg/L
DLCO unc % pred: 72 %
DLCO unc: 15.13 ml/min/mmHg
FEF 25-75 Post: 1.89 L/s
FEF 25-75 Pre: 2.14 L/s
FEF2575-%Change-Post: -11 %
FEF2575-%Pred-Post: 91 %
FEF2575-%Pred-Pre: 103 %
FEV1-%Change-Post: -4 %
FEV1-%Pred-Post: 78 %
FEV1-%Pred-Pre: 82 %
FEV1-Post: 1.95 L
FEV1-Pre: 2.04 L
FEV1FVC-%Change-Post: 1 %
FEV1FVC-%Pred-Pre: 106 %
FEV6-%Change-Post: -5 %
FEV6-%Pred-Post: 75 %
FEV6-%Pred-Pre: 80 %
FEV6-Post: 2.38 L
FEV6-Pre: 2.53 L
FEV6FVC-%Pred-Post: 104 %
FEV6FVC-%Pred-Pre: 104 %
FVC-%Change-Post: -5 %
FVC-%Pred-Post: 72 %
FVC-%Pred-Pre: 77 %
FVC-Post: 2.38 L
FVC-Pre: 2.53 L
Post FEV1/FVC ratio: 82 %
Post FEV6/FVC ratio: 100 %
Pre FEV1/FVC ratio: 81 %
Pre FEV6/FVC Ratio: 100 %
RV % pred: 77 %
RV: 1.75 L
TLC % pred: 81 %
TLC: 4.38 L

## 2023-06-13 MED ORDER — ALBUTEROL SULFATE (2.5 MG/3ML) 0.083% IN NEBU
2.5000 mg | INHALATION_SOLUTION | Freq: Once | RESPIRATORY_TRACT | Status: AC
  Filled 2023-06-13: qty 3

## 2023-06-16 ENCOUNTER — Ambulatory Visit: Payer: Medicare PPO | Admitting: Nurse Practitioner

## 2023-06-16 ENCOUNTER — Encounter: Payer: Self-pay | Admitting: Nurse Practitioner

## 2023-06-16 VITALS — BP 118/78 | HR 60 | Temp 97.9°F | Ht 66.0 in | Wt 136.0 lb

## 2023-06-16 DIAGNOSIS — Z23 Encounter for immunization: Secondary | ICD-10-CM | POA: Diagnosis not present

## 2023-06-16 DIAGNOSIS — J4530 Mild persistent asthma, uncomplicated: Secondary | ICD-10-CM | POA: Diagnosis not present

## 2023-06-16 DIAGNOSIS — R053 Chronic cough: Secondary | ICD-10-CM

## 2023-06-16 MED ORDER — BUDESONIDE-FORMOTEROL FUMARATE 80-4.5 MCG/ACT IN AERO
2.0000 | INHALATION_SPRAY | Freq: Two times a day (BID) | RESPIRATORY_TRACT | 12 refills | Status: DC
Start: 2023-06-16 — End: 2023-07-17

## 2023-06-16 MED ORDER — ALBUTEROL SULFATE HFA 108 (90 BASE) MCG/ACT IN AERS
2.0000 | INHALATION_SPRAY | Freq: Four times a day (QID) | RESPIRATORY_TRACT | 2 refills | Status: DC | PRN
Start: 2023-06-16 — End: 2024-03-24

## 2023-06-16 NOTE — Patient Instructions (Addendum)
Continue flonase nasal spray 2 sprays each nostril daily Continue Albuterol inhaler 2 puffs every 6 hours as needed for shortness of breath or wheezing. Notify if symptoms persist despite rescue inhaler/neb use.   Start Symbicort 2 puffs Twice daily. Brush tongue and rinse mouth daily    Follow up in 6 weeks with Dr. Celine Mans or Philis Nettle and to see how inhaler is working. If symptoms do not improve or worsen, please contact office for sooner follow up or seek emergency care.

## 2023-06-16 NOTE — Assessment & Plan Note (Signed)
Abnormal CT chest, felt to be highly non-specific and unlikely related to progressive lung disorder. She does have a mild diffusing defect. She's had improvement with recent bronchodilator and steroids. Will monitor her symptoms and determine timing of repeat imaging as clinically indicated.

## 2023-06-16 NOTE — Assessment & Plan Note (Addendum)
Previous clinical improvement with prednisone course and bronchodilators/ICS. FeNO borderline at last OV. She had evidence of air trapping on HRCT. I don't think she needs to be on triple therapy at this point. We will start her on ICS/LABA with Symbicort 80 mcg 2 puffs Twice daily. Advised her to let use know if this is not covered by insurance and we will find an alternative. Reassess response at follow up. Continue measures to limit upper airway irritation. Action plan in place.  Patient Instructions  Continue flonase nasal spray 2 sprays each nostril daily Continue Albuterol inhaler 2 puffs every 6 hours as needed for shortness of breath or wheezing. Notify if symptoms persist despite rescue inhaler/neb use.   Start Symbicort 2 puffs Twice daily. Brush tongue and rinse mouth daily    Follow up in 6 weeks with Dr. Celine Mans or Philis Nettle and to see how inhaler is working. If symptoms do not improve or worsen, please contact office for sooner follow up or seek emergency care.

## 2023-06-16 NOTE — Progress Notes (Signed)
@Patient  ID: [name], female    DOB: [birth year], 72 y.o.   MRN: [id number]  Chief Complaint  Patient presents with   Follow-up    SOB  a lot of the time. Dry cough. Occasional wheezing. Chest tightness.    Referring provider: Lupita Raider, MD  HPI: 72 year old female, never smoker referred for abnormal CT of the chest and chronic cough.  She is a patient of Dr. Celine Mans and last seen in office 04/19/2023.  Past medical history significant for multiple sclerosis and mild dementia.  TEST/EVENTS:  03/07/2023 HRCT chest: atherosclerosis. No LAD. Few patchy areas of very mild peripheral predominant ground glass attenuation and septal thickening. Some scatted areas of mild cylindrical btx. Highly non-specific. Mild air trapping. Partial collapse of trachea and bronchi.   02/23/2023: OV with Dr. Celine Mans for pulmonary consult.  Ongoing cough for the past 3 to 4 weeks.  Cough is nonproductive usually.  Occasionally will bring up some white mucus that she swallows.  She has shortness of breath associated with the cough.  Does get tightness in her chest.  Has not tried anything for the cough.  Worse in the morning.  Does keep her from falling asleep.  Denies any recurrent history of pneumonia or bronchitis.  Denies heartburn.  Does have some postnasal drainage.  Regarding MS, has some issues with mobility and gait.  Occasional jerking movements.  Ophthalmic manifestations.  On biotin.  Coronary CT showed nonspecific interstitial prominence.  HRCT chest ordered for further evaluation to rule out interstitial lung disease which showed very subtle/mild changes including some upper lobe predominant mild cylindrical bronchiectasis.  Considered highly nonspecific and felt to unlikely be related to a progressive lung disorder.  04/19/2023: OV with Deante Blough NP for follow-up.  She had high-resolution CT scan which did not show any significant evidence of progressive interstitial lung disease. She continues to have a  persistent cough, which is occasionally productive with clear to yellow phlegm.  She also continues to have shortness of breath. Notices it more in the mornings and evenings. It has woken her up at night before but not on a regular basis. She feels like the SOB is very mild. No wheezing. Does not notice that she feels much different compared to her last visit. She does think the flonase has helped her nasal congestion and postnasal drip. She denies any fevers, chills, hemoptysis, night sweats, leg swelling, orthopnea. She has not tried any inhalers. She does have seasonal allergies which she is not currently taking anything for.   06/16/2023:: Today - follow up Patient presents today for follow up with her husband. At our last visit, we started her on Qvar but she never picked this up because the pharmacy was out. Her PCP ended up giving her Breztri to try. She tells me that this really helped. She felt like her chest was more open and she wasn't coughing as much. Breathing felt better too. She only had a sample of it so wasn't on it long. Has been out for a few weeks and symptoms seem to be back to how they were before the inhaler but no worse. Cough is occasionally productive with clear phlegm. No fevers, chills, wheeze, hemoptysis, weight loss, anorexia, leg swelling, orthopnea. She had PFTs that were normal aside from a mild diffusing defect. She has not gotten albuterol from the pharmacy. She is not taking anything for the cough.   Allergies  Allergen Reactions   Statins Anaphylaxis   Celebrex [Celecoxib] Itching and  Swelling    Throat swelling   Red Yeast Rice [Cholestin] Itching and Swelling    Throat swelling   Amoxicillin Diarrhea   Citalopram     Memory issues   Codeine Nausea And Vomiting   [place]ascus Purpureus Went Yeast Other (See Comments)   Morphine     crying   Reclast [Zoledronic Acid] Other (See Comments)    Chest tightness   Sulfa Antibiotics Itching   Tramadol     LACK OF  EFFECT   Zocor [Simvastatin] Itching and Swelling    Throat swelling   Achromycin [Tetracycline] Hives   Carafate [Sucralfate] Rash   Codeine Sulfate Nausea And Vomiting   Colesevelam Nausea And Vomiting   Welchol [Colesevelam Hcl] Swelling    Immunization History  Administered Date(s) Administered   COVID-19, mRNA, vaccine(Comirnaty)12 years and older 10/26/2022   Fluad Quad(high Dose 65+) 07/08/2019   Fluad Trivalent(High Dose 65+) 06/16/2023   Influenza, High Dose Seasonal PF 07/10/2022   Influenza,inj,Quad PF,6+ Mos 07/06/2016   PFIZER(Purple Top)SARS-COV-2 Vaccination 12/05/2019, 12/26/2019, 10/07/2020    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 With the 1.5mg      acetaminophen (TYLENOL) 500 MG tablet Take by mouth as needed.     benzonatate (TESSALON) 200 MG capsule Take 1 capsule (200 mg total) by mouth 3 (three) times daily as needed for cough. 30 capsule 1   Beta Carotene (VITAMIN A) 25000 UNIT capsule Take 25,000 Units by mouth daily.     Cholecalciferol (VITAMIN D PO) Take 1,000 mg by mouth daily.     Coenzyme Q10 (COQ10 PO) Take by mouth daily.     Docosahexaenoic Acid (DHA PO) Take 1,660 mg by mouth daily.     DULoxetine (CYMBALTA) 20 MG capsule Take 20 mg by  mouth daily.     ezetimibe (ZETIA) 10 MG tablet Take 10 mg by mouth daily.     flecainide (TAMBOCOR) 50 MG tablet TAKE 1 TABLET BY MOUTH TWICE A DAY 180 tablet 3   IBUPROFEN PO Take by mouth daily as needed.     Insulin Pen Needle 31G X 5 MM MISC Use to inject Forteo once daily. (Patient not taking: Reported on 04/19/2023) 100 each 5   meloxicam (MOBIC) 15 MG tablet Take 7.5 mg by mouth 2 (two) times daily.     OLANZapine (ZYPREXA) 2.5 MG tablet Take 2.5 mg by mouth 2 (two) times daily as needed.     Omega-3 Fatty Acids (FISH OIL) 1200 MG CAPS Take 2 capsules by mouth 2 (two) times daily.     pravastatin (PRAVACHOL) 20 MG tablet Take 20 mg by mouth daily.     pregabalin (LYRICA) 75 MG capsule Take 75 mg  by mouth 3 (three) times daily.     propranolol ER (INDERAL LA) 60 MG 24 hr capsule TAKE 1 CAPSULE BY MOUTH EVERY DAY 90 capsule 3   rivastigmine (EXELON) 1.5 MG capsule Take 1.5 mg by mouth 2 (two) times daily. With the 3mg      beclomethasone (QVAR REDIHALER) 40 MCG/ACT inhaler Inhale 2 puffs into the lungs 2 (two) times daily. (Patient not taking: Reported on 06/16/2023) 1 each 5   Facility-Administered Medications Prior to Visit  Medication Dose Route Frequency Provider Last Rate Last Admin   albuterol (PROVENTIL) (2.5 MG/3ML) 0.083% nebulizer solution 2.5 mg  2.5 mg Nebulization Once Cassy Sprowl, Ruby Cola, NP         Review of Systems:   Constitutional: No weight loss or gain, night sweats, fevers, chills,or lassitude. +chronic fatigue  HEENT: No headaches, difficulty swallowing, tooth/dental problems, or sore throat. No sneezing, itching, ear ache. +improved nasal congestion, post nasal drip CV:  No chest pain, orthopnea, PND, swelling in lower extremities, anasarca, dizziness, palpitations, syncope Resp: +minimal shortness of breath with exertion; minimally productive cough. No excess mucus or change in color of mucus. No hemoptysis. No wheezing.  No chest wall deformity GI:  No heartburn,  indigestion GU: No dysuria, change in color of urine, urgency or frequency. Skin: No rash, lesions, ulcerations MSK:  No increased joint pain or swelling.   Neuro: No dizziness or lightheadedness.  Psych: No depression or anxiety. Mood stable.     Physical Exam:  BP 118/78 (BP Location: Right Arm, Cuff Size: Normal)   Pulse 60   Temp 97.9 F (36.6 C)   Ht 5\' 6"  (1.676 m)   Wt 136 lb (61.7 kg)   LMP  (LMP Unknown)   SpO2 96%   BMI 21.95 kg/m   GEN: Pleasant, interactive, well-appearing; in no acute distress. HEENT:  Normocephalic and atraumatic. PERRLA. Sclera white. Nasal turbinates pink, moist and patent bilaterally. No rhinorrhea present. Oropharynx pink and moist, without exudate or edema. No lesions, ulcerations, or postnasal drip.  NECK:  Supple w/ fair ROM. No JVD present. Normal carotid impulses w/o bruits. Thyroid symmetrical with no goiter or nodules palpated. No lymphadenopathy.   CV: RRR, no m/r/g, no peripheral edema. Pulses intact, +2 bilaterally. No cyanosis, pallor or clubbing. PULMONARY:  Unlabored, regular breathing. Clear bilaterally A&P w/o wheezes/rales/rhonchi. No accessory muscle use.  GI: BS present and normoactive. Soft, non-tender to palpation. No organomegaly or masses detected.  MSK: No erythema, warmth or tenderness. Cap refil <2 sec all extrem. No deformities or joint swelling noted.  Neuro: A/Ox3. No focal deficits noted.   Skin: Warm, no lesions or rashe Psych: Normal affect and behavior. Judgement and thought content appropriate.     Lab Results:  CBC    Component Value Date/Time   WBC 5.8 04/19/2023 1144   RBC 4.42 04/19/2023 1144   HGB 13.2 04/19/2023 1144   HGB 12.2 09/20/2019 1052   HCT 39.9 04/19/2023 1144   HCT 36.3 09/20/2019 1052   PLT 158 04/19/2023 1144   PLT 183 09/20/2019 1052   MCV 90.3 04/19/2023 1144   MCV 91 09/20/2019 1052   MCH 29.9 04/19/2023 1144   MCHC 33.1 04/19/2023 1144   RDW 13.2 04/19/2023 1144   RDW 12.8  09/20/2019 1052   LYMPHSABS 1.7 04/19/2023 1144   MONOABS 0.4 04/19/2023 1144   EOSABS 0.1 04/19/2023 1144   BASOSABS 0.1 04/19/2023 1144    BMET    Component Value Date/Time   NA  143 04/24/2023 1356   NA 145 (H) 02/02/2023 1001   K 4.3 04/24/2023 1356   CL 106 04/24/2023 1356   CO2 30 04/24/2023 1356   GLUCOSE 127 (H) 04/24/2023 1356   BUN 21 04/24/2023 1356   BUN 20 02/02/2023 1001   CREATININE 0.81 04/24/2023 1356   CALCIUM 9.5 04/24/2023 1356   GFRNONAA >60 02/07/2022 1540   GFRNONAA 67 12/30/2020 0901   GFRAA 78 12/30/2020 0901    BNP No results found for: "BNP"   Imaging:  No results found.  denosumab (PROLIA) injection 60 mg     Date Action Dose Route User   05/01/2023 1313 Given 60 mg Subcutaneous (Left Arm) Salli Real R, LPN           No data to display          Lab Results  Component Value Date   NITRICOXIDE 23 04/19/2023        Assessment & Plan:   Reactive airway disease Previous clinical improvement with prednisone course and bronchodilators/ICS. FeNO borderline at last OV. She had evidence of air trapping on HRCT. I don't think she needs to be on triple therapy at this point. We will start her on ICS/LABA with Symbicort 80 mcg 2 puffs Twice daily. Advised her to let use know if this is not covered by insurance and we will find an alternative. Reassess response at follow up. Continue measures to limit upper airway irritation. Action plan in place.  Patient Instructions  Continue flonase nasal spray 2 sprays each nostril daily Continue Albuterol inhaler 2 puffs every 6 hours as needed for shortness of breath or wheezing. Notify if symptoms persist despite rescue inhaler/neb use.   Start Symbicort 2 puffs Twice daily. Brush tongue and rinse mouth daily    Follow up in 6 weeks with Dr. Celine Mans or Philis Nettle and to see how inhaler is working. If symptoms do not improve or worsen, please contact office for sooner follow up or seek  emergency care.    Chronic cough Abnormal CT chest, felt to be highly non-specific and unlikely related to progressive lung disorder. She does have a mild diffusing defect. She's had improvement with recent bronchodilator and steroids. Will monitor her symptoms and determine timing of repeat imaging as clinically indicated.     I spent 35 minutes of dedicated to the care of this patient on the date of this encounter to include pre-visit review of records, face-to-face time with the patient discussing conditions above, post visit ordering of testing, clinical documentation with the electronic health record, making appropriate referrals as documented, and communicating necessary findings to members of the patients care team.  Noemi Chapel, NP 06/16/2023  Pt aware and understands NP's role.

## 2023-06-28 DIAGNOSIS — L578 Other skin changes due to chronic exposure to nonionizing radiation: Secondary | ICD-10-CM | POA: Diagnosis not present

## 2023-06-28 DIAGNOSIS — Z85828 Personal history of other malignant neoplasm of skin: Secondary | ICD-10-CM | POA: Diagnosis not present

## 2023-06-28 DIAGNOSIS — Z872 Personal history of diseases of the skin and subcutaneous tissue: Secondary | ICD-10-CM | POA: Diagnosis not present

## 2023-06-28 DIAGNOSIS — L570 Actinic keratosis: Secondary | ICD-10-CM | POA: Diagnosis not present

## 2023-07-11 DIAGNOSIS — R2681 Unsteadiness on feet: Secondary | ICD-10-CM | POA: Diagnosis not present

## 2023-07-11 DIAGNOSIS — M6281 Muscle weakness (generalized): Secondary | ICD-10-CM | POA: Diagnosis not present

## 2023-07-14 DIAGNOSIS — R2681 Unsteadiness on feet: Secondary | ICD-10-CM | POA: Diagnosis not present

## 2023-07-14 DIAGNOSIS — M6281 Muscle weakness (generalized): Secondary | ICD-10-CM | POA: Diagnosis not present
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
## 2023-07-17 ENCOUNTER — Ambulatory Visit: Payer: Medicare PPO | Attending: Physician Assistant | Admitting: Physician Assistant

## 2023-07-17 ENCOUNTER — Encounter: Payer: Self-pay | Admitting: Physician Assistant

## 2023-07-17 VITALS — BP 120/72 | HR 60 | Ht 66.0 in | Wt 136.0 lb

## 2023-07-17 DIAGNOSIS — R002 Palpitations: Secondary | ICD-10-CM | POA: Diagnosis not present

## 2023-07-17 DIAGNOSIS — R609 Edema, unspecified: Secondary | ICD-10-CM | POA: Diagnosis not present

## 2023-07-17 DIAGNOSIS — I491 Atrial premature depolarization: Secondary | ICD-10-CM

## 2023-07-17 DIAGNOSIS — Z79899 Other long term (current) drug therapy: Secondary | ICD-10-CM

## 2023-07-17 DIAGNOSIS — Z5181 Encounter for therapeutic drug level monitoring: Secondary | ICD-10-CM | POA: Diagnosis not present

## 2023-07-17 NOTE — Patient Instructions (Addendum)
Medication Instructions:   Your physician recommends that you continue on your current medications as directed. Please refer to the Current Medication list given to you today.   *If you need a refill on your cardiac medications before your next appointment, please call your pharmacy*   Lab Work: NONE ORDERED  TODAY    If you have labs (blood work) drawn today and your tests are completely normal, you will receive your results only by: MyChart Message (if you have MyChart) OR A paper copy in the mail If you have any lab test that is abnormal or we need to change your treatment, we will call you to review the results.   Testing/Procedures:  Your physician has requested that you have a lower or upper extremity venous duplex. This test is an ultrasound of the veins in the legs or arms. It looks at venous blood flow that carries blood from the heart to the legs or arms. Allow one hour for a Lower Venous exam. Allow thirty minutes for an Upper Venous exam. There are no restrictions or special instructions.    Follow-Up: At [facility], you and your health needs are our priority.  As part of our continuing mission to provide you with exceptional heart care, we have created designated Provider Care Teams.  These Care Teams include your primary Cardiologist (physician) and Advanced Practice Providers (APPs -  Physician Assistants and Nurse Practitioners) who all work together to provide you with the care you need, when you need it.  We recommend signing up for the patient portal called "MyChart".  Sign up information is provided on this After Visit Summary.  MyChart is used to connect with patients for Virtual Visits (Telemedicine).  Patients are able to view lab/test results, encounter notes, upcoming appointments, etc.  Non-urgent messages can be sent to your provider as well.   To learn more about what you can do with MyChart, go to [email or web address].    Your next appointment:    4 month(s) ( CONTACT  CASSIE HALL/ ANGELINE HAMMER FOR EP SCHEDULING ISSUES )  Provider:   You may see Lewayne Bunting, MD or one of the following Advanced Practice Providers on your designated Care Team:   Francis Dowse, [place]   Other Instructions

## 2023-07-18 DIAGNOSIS — M6281 Muscle weakness (generalized): Secondary | ICD-10-CM | POA: Diagnosis not present

## 2023-07-18 DIAGNOSIS — R2681 Unsteadiness on feet: Secondary | ICD-10-CM | POA: Diagnosis not present

## 2023-07-20 DIAGNOSIS — I491 Atrial premature depolarization: Secondary | ICD-10-CM | POA: Diagnosis not present

## 2023-07-20 DIAGNOSIS — Z0000 Encounter for general adult medical examination without abnormal findings: Secondary | ICD-10-CM | POA: Diagnosis not present

## 2023-07-20 DIAGNOSIS — K580 Irritable bowel syndrome with diarrhea: Secondary | ICD-10-CM | POA: Diagnosis not present

## 2023-07-20 DIAGNOSIS — E782 Mixed hyperlipidemia: Secondary | ICD-10-CM | POA: Diagnosis not present

## 2023-07-20 DIAGNOSIS — M545 Low back pain, unspecified: Secondary | ICD-10-CM | POA: Diagnosis not present

## 2023-07-20 DIAGNOSIS — F411 Generalized anxiety disorder: Secondary | ICD-10-CM | POA: Diagnosis not present

## 2023-07-20 DIAGNOSIS — J45909 Unspecified asthma, uncomplicated: Secondary | ICD-10-CM | POA: Diagnosis not present

## 2023-07-20 DIAGNOSIS — M810 Age-related osteoporosis without current pathological fracture: Secondary | ICD-10-CM | POA: Diagnosis not present

## 2023-07-20 DIAGNOSIS — F0390 Unspecified dementia without behavioral disturbance: Secondary | ICD-10-CM | POA: Diagnosis not present

## 2023-07-20 DIAGNOSIS — G35 Multiple sclerosis: Secondary | ICD-10-CM | POA: Diagnosis not present

## 2023-07-20 LAB — LAB REPORT - SCANNED: EGFR: 91

## 2023-07-24 ENCOUNTER — Encounter (HOSPITAL_COMMUNITY): Payer: Medicare PPO

## 2023-07-25 DIAGNOSIS — R2681 Unsteadiness on feet: Secondary | ICD-10-CM | POA: Diagnosis not present

## 2023-07-25 DIAGNOSIS — M6281 Muscle weakness (generalized): Secondary | ICD-10-CM | POA: Diagnosis not present

## 2023-07-26 DIAGNOSIS — M6281 Muscle weakness (generalized): Secondary | ICD-10-CM | POA: Diagnosis not present

## 2023-07-26 DIAGNOSIS — R2681 Unsteadiness on feet: Secondary | ICD-10-CM | POA: Diagnosis not present

## 2023-07-27 ENCOUNTER — Ambulatory Visit (HOSPITAL_COMMUNITY)
Admission: RE | Admit: 2023-07-27 | Discharge: 2023-07-27 | Disposition: A | Discharge status: Home / Self Care | Payer: Medicare PPO | Source: Ambulatory Visit | Attending: Cardiology | Admitting: Cardiology

## 2023-07-27 DIAGNOSIS — R609 Edema, unspecified: Secondary | ICD-10-CM | POA: Diagnosis not present

## 2023-07-31 ENCOUNTER — Telehealth: Payer: Self-pay

## 2023-07-31 NOTE — Telephone Encounter (Signed)
-----   Message from Sheilah Pigeon sent at 07/31/2023  8:12 AM EDT ----- Looks good.  No DVT.  Elevate feet wen seated to try and improve swelling

## 2023-07-31 NOTE — Telephone Encounter (Signed)
Spoke with husband per DPR and he is aware of VAS [place] results and provider recommendations. No questions at this time.

## 2023-08-01 DIAGNOSIS — M6281 Muscle weakness (generalized): Secondary | ICD-10-CM | POA: Diagnosis not present

## 2023-08-01 DIAGNOSIS — R2681 Unsteadiness on feet: Secondary | ICD-10-CM | POA: Diagnosis not present

## 2023-08-04 DIAGNOSIS — M6281 Muscle weakness (generalized): Secondary | ICD-10-CM | POA: Diagnosis not present

## 2023-08-04 DIAGNOSIS — R2681 Unsteadiness on feet: Secondary | ICD-10-CM | POA: Diagnosis not present

## 2023-08-07 ENCOUNTER — Encounter: Payer: Self-pay | Admitting: Pulmonary Disease

## 2023-08-07 ENCOUNTER — Ambulatory Visit (INDEPENDENT_AMBULATORY_CARE_PROVIDER_SITE_OTHER): Payer: Medicare PPO | Admitting: Pulmonary Disease

## 2023-08-07 VITALS — BP 98/68 | HR 64 | Temp 98.0°F | Ht 66.0 in | Wt 138.6 lb

## 2023-08-07 DIAGNOSIS — J45991 Cough variant asthma: Secondary | ICD-10-CM

## 2023-08-07 MED ORDER — BUDESONIDE-FORMOTEROL FUMARATE 80-4.5 MCG/ACT IN AERO
2.0000 | INHALATION_SPRAY | Freq: Two times a day (BID) | RESPIRATORY_TRACT | 12 refills | Status: AC
Start: 2023-08-07 — End: ?

## 2023-08-07 MED ORDER — ESOMEPRAZOLE MAGNESIUM 40 MG PO CPDR
40.0000 mg | DELAYED_RELEASE_CAPSULE | Freq: Every day | ORAL | 3 refills | Status: DC
Start: 2023-08-07 — End: 2024-04-11

## 2023-08-07 NOTE — Progress Notes (Signed)
Synopsis: Referred in by Lupita Raider, MD   Subjective:   PATIENT ID: [name] GENDER: female DOB: [birth year], MRN: [id number]  Chief Complaint  Patient presents with   Follow-up    Occasional shortness of breath and dry cough. No wheezing. Albuterol causes palpitations. Has not used Symbicort.     HPI 72 year old female, never smoker referred for abnormal CT of the chest and chronic cough.  She is a patient of Dr. Celine Mans and last seen in office 04/19/2023.  Past medical history significant for multiple sclerosis and mild dementia.   TEST/EVENTS:  03/07/2023 HRCT chest: atherosclerosis. No LAD. Few patchy areas of very mild peripheral predominant ground glass attenuation and septal thickening. Some scatted areas of mild cylindrical btx. Highly non-specific. Mild air trapping. Partial collapse of trachea and bronchi.    02/23/2023: OV with Dr. Celine Mans for pulmonary consult.  Ongoing cough for the past 3 to 4 weeks.  Cough is nonproductive usually.  Occasionally will bring up some white mucus that she swallows.  She has shortness of breath associated with the cough.  Does get tightness in her chest.  Has not tried anything for the cough.  Worse in the morning.  Does keep her from falling asleep.  Denies any recurrent history of pneumonia or bronchitis.  Denies heartburn.  Does have some postnasal drainage.  Regarding MS, has some issues with mobility and gait.  Occasional jerking movements.  Ophthalmic manifestations.  On biotin.  Coronary CT showed nonspecific interstitial prominence.  HRCT chest ordered for further evaluation to rule out interstitial lung disease which showed very subtle/mild changes including some upper lobe predominant mild cylindrical bronchiectasis.  Considered highly nonspecific and felt to unlikely be related to a progressive lung disorder.   04/19/2023: OV with Cobb NP for follow-up.  She had high-resolution CT scan which did not show any significant evidence of  progressive interstitial lung disease. She continues to have a persistent cough, which is occasionally productive with clear to yellow phlegm.  She also continues to have shortness of breath. Notices it more in the mornings and evenings. It has woken her up at night before but not on a regular basis. She feels like the SOB is very mild. No wheezing. Does not notice that she feels much different compared to her last visit. She does think the flonase has helped her nasal congestion and postnasal drip. She denies any fevers, chills, hemoptysis, night sweats, leg swelling, orthopnea. She has not tried any inhalers. She does have seasonal allergies which she is not currently taking anything for.    06/16/2023: Patient presents today for follow up with her husband. At our last visit, we started her on Qvar but she never picked this up because the pharmacy was out. Her PCP ended up giving her Breztri to try. She tells me that this really helped. She felt like her chest was more open and she wasn't coughing as much. Breathing felt better too. She only had a sample of it so wasn't on it long. Has been out for a few weeks and symptoms seem to be back to how they were before the inhaler but no worse. Cough is occasionally productive with clear phlegm. No fevers, chills, wheeze, hemoptysis, weight loss, anorexia, leg swelling, orthopnea. She had PFTs that were normal aside from a mild diffusing defect. She has not gotten albuterol from the pharmacy. She is not taking anything for the cough.     08/07/2023: Follow up  Reports feeling generally ok,  did not start symbicort unclear reason. Says feels she has to yawn to fill up lungs with air. Still with cough and mild sputum production. No wheezing. Also reports a history of GERD but currently not experiencing any symptoms. She underwent a high res CT chest on 05/28 which was underwhelming and showed non specific GGO. PFTs 06/13/2023 with mild restrictive defect and mild  reduction in DLCO.   ROS All systems were reviewed and re negative except for the above. Objective:   Vitals:   08/07/23 1331  BP: 98/68  Pulse: 64  Temp: 98 F (36.7 C)  TempSrc: Temporal  SpO2: 97%  Weight: 138 lb 9.6 oz (62.9 kg)  Height: 5\' 6"  (1.676 m)   97% on RA BMI Readings from Last 3 Encounters:  08/07/23 22.37 kg/m  07/17/23 21.95 kg/m  06/16/23 21.95 kg/m   Wt Readings from Last 3 Encounters:  08/07/23 138 lb 9.6 oz (62.9 kg)  07/17/23 136 lb (61.7 kg)  06/16/23 136 lb (61.7 kg)    Physical Exam GEN: NAD, Healthy Appearing HEENT: Supple Neck, Reactive Pupils, EOMI  CVS: Normal S1, Normal S2, RRR, No murmurs or ES appreciated  Lungs: Clear bilateral air entry.  Abdomen: Soft, non tender, non distended, + BS  Extremities: Warm and well perfused, No edema  Skin: No suspicious lesions appreciated  Psych: Normal Affect  Ancillary Information   CBC    Component Value Date/Time   WBC 5.8 04/19/2023 1144   RBC 4.42 04/19/2023 1144   HGB 13.2 04/19/2023 1144   HGB 12.2 09/20/2019 1052   HCT 39.9 04/19/2023 1144   HCT 36.3 09/20/2019 1052   PLT 158 04/19/2023 1144   PLT 183 09/20/2019 1052   MCV 90.3 04/19/2023 1144   MCV 91 09/20/2019 1052   MCH 29.9 04/19/2023 1144   MCHC 33.1 04/19/2023 1144   RDW 13.2 04/19/2023 1144   RDW 12.8 09/20/2019 1052   LYMPHSABS 1.7 04/19/2023 1144   MONOABS 0.4 04/19/2023 1144   EOSABS 0.1 04/19/2023 1144   BASOSABS 0.1 04/19/2023 1144    Imaging  High res CT chest 08/07/2023 High-resolution images demonstrate a few patchy areas of very mild peripheral predominant ground-glass attenuation and septal thickening, highly nonspecific. There also some scattered areas of mild cylindrical bronchiectasis. No subpleural reticulation or honeycombing identified. Inspiratory and expiratory imaging demonstrates mild air trapping indicative of mild small airways disease. There is also some partial collapse of the trachea  and mainstem bronchi indicative of tracheobronchomalacia. No acute consolidative airspace disease. No pleural effusions. No definite suspicious appearing pulmonary nodules or masses are noted.    Latest Ref Rng & Units 06/13/2023    2:33 PM  PFT Results  FVC-Pre L 2.53   FVC-Predicted Pre % 77   FVC-Post L 2.38   FVC-Predicted Post % 72   Pre FEV1/FVC % % 81   Post FEV1/FCV % % 82   FEV1-Pre L 2.04   FEV1-Predicted Pre % 82   FEV1-Post L 1.95   DLCO uncorrected ml/min/mmHg 15.13   DLCO UNC% % 72   DLVA Predicted % 90   TLC L 4.38   TLC % Predicted % 81   RV % Predicted % 77      Assessment & Plan:  72 year old female, never smoker referred for abnormal CT of the chest and chronic cough.  She is a patient of Dr. Celine Mans and last seen in office 04/19/2023.  Past medical history significant for multiple sclerosis and mild dementia Presenting with  ongoing cough, chest tightness and fatigue.   My impression is we are dealing with adult onset asthma/reactive airway disease. Her symptom descriptiuon of chest tightness 'has to yawn to fill up lungs with air' and improvement with breztri fits the picture. We had underwhelming findings on CT chest and PFTs. Furhtermore she has a history of GERD and although not having symptoms, subtle acid reflux can effect her underlying asthma.  []  Start budesonide-formoterol [symbicort] 80-4.5 2puffs BID.  []  Start esomeprazol [Nexium] 40mg  1 tab PO daily   Return in about 3 months (around 11/07/2023).  I spent 60 minutes caring for this patient today, including preparing to see the patient, obtaining a medical history , reviewing a separately obtained history, performing a medically appropriate examination and/or evaluation, counseling and educating the patient/family/caregiver, ordering medications, tests, or procedures, documenting clinical information in the electronic health record, and independently interpreting results (not separately reported/billed)  and communicating results to the patient/family/caregiver  Janann Colonel, MD [facility] 08/07/2023 2:16 PM

## 2023-08-08 DIAGNOSIS — M6281 Muscle weakness (generalized): Secondary | ICD-10-CM | POA: Diagnosis not present

## 2023-08-08 DIAGNOSIS — R2681 Unsteadiness on feet: Secondary | ICD-10-CM | POA: Diagnosis not present

## 2023-08-11 DIAGNOSIS — R2681 Unsteadiness on feet: Secondary | ICD-10-CM | POA: Diagnosis not present

## 2023-08-11 DIAGNOSIS — M6281 Muscle weakness (generalized): Secondary | ICD-10-CM | POA: Diagnosis not present

## 2023-08-11 DIAGNOSIS — M25511 Pain in right shoulder: Secondary | ICD-10-CM | POA: Diagnosis not present

## 2023-08-14 ENCOUNTER — Telehealth: Payer: Self-pay | Admitting: Pharmacy Technician

## 2023-08-14 NOTE — Telephone Encounter (Signed)
Auth Submission: APPROVED pa renewal Site of care: Site of care: [facility] Payer: [employer] MEDICARE Medication & CPT/J Code(s) submitted: Prolia (Denosumab) [id number] Route of submission (phone, fax, portal): PORTAL Phone # Fax # Auth type: Buy/Bill PB Units/visits requested: 60MG  Q6 MONTHS Reference number: [id number] Approval from: 10/11/23 to 10/09/24

## 2023-08-17 DIAGNOSIS — R2681 Unsteadiness on feet: Secondary | ICD-10-CM | POA: Diagnosis not present

## 2023-08-17 DIAGNOSIS — M6281 Muscle weakness (generalized): Secondary | ICD-10-CM | POA: Diagnosis not present

## 2023-08-17 DIAGNOSIS — M25511 Pain in right shoulder: Secondary | ICD-10-CM | POA: Diagnosis not present

## 2023-08-23 DIAGNOSIS — M25511 Pain in right shoulder: Secondary | ICD-10-CM | POA: Diagnosis not present

## 2023-08-23 DIAGNOSIS — R2681 Unsteadiness on feet: Secondary | ICD-10-CM | POA: Diagnosis not present

## 2023-08-23 DIAGNOSIS — M6281 Muscle weakness (generalized): Secondary | ICD-10-CM | POA: Diagnosis not present

## 2023-08-25 DIAGNOSIS — R2681 Unsteadiness on feet: Secondary | ICD-10-CM | POA: Diagnosis not present

## 2023-08-25 DIAGNOSIS — M6281 Muscle weakness (generalized): Secondary | ICD-10-CM | POA: Diagnosis not present

## 2023-08-25 DIAGNOSIS — M25511 Pain in right shoulder: Secondary | ICD-10-CM | POA: Diagnosis not present

## 2023-08-30 DIAGNOSIS — M25511 Pain in right shoulder: Secondary | ICD-10-CM | POA: Diagnosis not present

## 2023-08-30 DIAGNOSIS — M6281 Muscle weakness (generalized): Secondary | ICD-10-CM | POA: Diagnosis not present

## 2023-08-30 DIAGNOSIS — R2681 Unsteadiness on feet: Secondary | ICD-10-CM | POA: Diagnosis not present

## 2023-09-01 DIAGNOSIS — M6281 Muscle weakness (generalized): Secondary | ICD-10-CM | POA: Diagnosis not present

## 2023-09-01 DIAGNOSIS — R2681 Unsteadiness on feet: Secondary | ICD-10-CM | POA: Diagnosis not present

## 2023-09-01 DIAGNOSIS — M25511 Pain in right shoulder: Secondary | ICD-10-CM | POA: Diagnosis not present

## 2023-10-25 DIAGNOSIS — M47816 Spondylosis without myelopathy or radiculopathy, lumbar region: Secondary | ICD-10-CM | POA: Diagnosis not present

## 2023-11-01 ENCOUNTER — Ambulatory Visit: Payer: Medicare PPO

## 2023-11-01 MED ORDER — DENOSUMAB 60 MG/ML ~~LOC~~ SOSY: 60.0000 mg | PREFILLED_SYRINGE | Freq: Once | SUBCUTANEOUS | Status: DC

## 2023-11-06 DIAGNOSIS — M47816 Spondylosis without myelopathy or radiculopathy, lumbar region: Secondary | ICD-10-CM | POA: Diagnosis not present

## 2023-11-07 ENCOUNTER — Ambulatory Visit: Payer: Medicare PPO

## 2023-11-07 VITALS — BP 112/68 | HR 57 | Temp 97.5°F | Resp 16 | Ht 66.0 in | Wt 135.4 lb

## 2023-11-07 DIAGNOSIS — M810 Age-related osteoporosis without current pathological fracture: Secondary | ICD-10-CM | POA: Diagnosis not present

## 2023-11-07 MED ORDER — DENOSUMAB 60 MG/ML ~~LOC~~ SOSY
60.0000 mg | PREFILLED_SYRINGE | Freq: Once | SUBCUTANEOUS | Status: AC
  Administered 2023-11-07: 60 mg via SUBCUTANEOUS
  Filled 2023-11-07: qty 1

## 2023-11-07 NOTE — Progress Notes (Signed)
Diagnosis: Osteoporosis  Provider:  Chilton Greathouse MD  Procedure: Injection  Prolia (Denosumab), Dose: 60 mg, Site: subcutaneous, Number of injections: 1  Injection Site(s): Right arm  Post Care: Observation period completed  Discharge: Condition: Good, Destination: Home . AVS Provided  Performed by:  Forrest Moron, RN

## 2023-11-08 ENCOUNTER — Ambulatory Visit: Payer: Medicare PPO | Admitting: Pulmonary Disease

## 2023-11-08 ENCOUNTER — Encounter: Payer: Self-pay | Admitting: Pulmonary Disease

## 2023-11-08 VITALS — BP 124/72 | HR 54 | Temp 96.6°F | Ht 66.0 in | Wt 133.2 lb

## 2023-11-08 DIAGNOSIS — J45991 Cough variant asthma: Secondary | ICD-10-CM

## 2023-11-08 LAB — NITRIC OXIDE: Nitric Oxide: 16

## 2023-11-08 NOTE — Progress Notes (Signed)
Synopsis: Referred in by Amy Raider, MD   Subjective:   PATIENT ID: [name]: female DOB: 12/17/1950, MRN: [id number]  Chief Complaint  Patient presents with   Follow-up    DOE. No wheezing. Dry cough.     HPI Ms. [name] is a pleasant 73 year old female patient with a past medical history of multiple sclerosis neurodeficit including difficulty ambulating, decreased vision and memory deficit, mild persistent asthma presenting today to the pulmonary clinic to establish care.  She saw Dr. Celine Jimenez a year ago when she presented with ongoing dry cough associated with shortness of breath.  She was prescribed Symbicort which had helped her significantly.  PFT 09/24 with lower limit of normal FVC, lower limit of normal FEV1 and increased FEV1 to FVC ratio.  Normal lung volumes.  And mildly reduced DLCO.  No significant response to bronchodilators.  CT scan was obtained on 05/24 which did not show any signs of interstitial lung disease.  She is feeling well on Symbicort 80-4.52 puffs twice daily.  Reports rarely needing to use her rescue inhaler.  She is still experiencing inability to take a deep breath sometimes and needing to be on however that has been less frequent.  Family history -denies any family history of pulmonary diseases.  Social history -never smoker.  ROS All systems were reviewed and are negative except for the above.  Objective:   Vitals:   11/08/23 1330  BP: 124/72  Pulse: (!) 54  Temp: (!) 96.6 F (35.9 C)  SpO2: 95%  Weight: 133 lb 3.2 oz (60.4 kg)  Height: 5\' 6"  (1.676 m)   95% on RA BMI Readings from Last 3 Encounters:  11/08/23 21.50 kg/m  11/07/23 21.85 kg/m  08/07/23 22.37 kg/m   Wt Readings from Last 3 Encounters:  11/08/23 133 lb 3.2 oz (60.4 kg)  11/07/23 135 lb 6.4 oz (61.4 kg)  08/07/23 138 lb 9.6 oz (62.9 kg)    Physical Exam GEN: NAD, Healthy Appearing HEENT: Supple Neck, Reactive Pupils, EOMI  CVS: Normal S1, Normal  S2, RRR, No murmurs or ES appreciated  Lungs: Clear bilateral air entry.  Abdomen: Soft, non tender, non distended, + BS  Extremities: Warm and well perfused, No edema  Skin: No suspicious lesions appreciated  Psych: Normal Affect  Ancillary Information   CBC    Component Value Date/Time   WBC 5.8 04/19/2023 1144   RBC 4.42 04/19/2023 1144   HGB 13.2 04/19/2023 1144   HGB 12.2 09/20/2019 1052   HCT 39.9 04/19/2023 1144   HCT 36.3 09/20/2019 1052   PLT 158 04/19/2023 1144   PLT 183 09/20/2019 1052   MCV 90.3 04/19/2023 1144   MCV 91 09/20/2019 1052   MCH 29.9 04/19/2023 1144   MCHC 33.1 04/19/2023 1144   RDW 13.2 04/19/2023 1144   RDW 12.8 09/20/2019 1052   LYMPHSABS 1.7 04/19/2023 1144   MONOABS 0.4 04/19/2023 1144   EOSABS 0.1 04/19/2023 1144   BASOSABS 0.1 04/19/2023 1144   Labs and imaging were reviewed.     Latest Ref Rng & Units 06/13/2023    2:33 PM  PFT Results  FVC-Pre L 2.53   FVC-Predicted Pre % 77   FVC-Post L 2.38   FVC-Predicted Post % 72   Pre FEV1/FVC % % 81   Post FEV1/FCV % % 82   FEV1-Pre L 2.04   FEV1-Predicted Pre % 82   FEV1-Post L 1.95   DLCO uncorrected ml/min/mmHg 15.13   DLCO UNC% %  72   DLVA Predicted % 90   TLC L 4.38   TLC % Predicted % 81   RV % Predicted % 77     Assessment & Plan:  Ms. [name] is a pleasant 73 year old female patient with a past medical history of multiple sclerosis neurodeficit including difficulty ambulating, decreased vision and memory deficit, mild persistent asthma presenting today to the pulmonary clinic to establish care.  #Mild persistent asthma   []  Agree w/ Budesonide-Formoterol [Symbicort] 160-4.5 2 puffs twice a day. And advised use of 2puffs PRN as well when needed.  []  Will hold off on repeating a CT chest given improvement with inhaler therapy  []  I will see her back in 3 months and if need be will go up on her symbicort.   MS - followed clinically with Dr. Margarette Canada Jimenez.   Return in about 3 months  (around 02/06/2024).  I spent 50 minutes caring for this patient today, including preparing to see the patient, obtaining a medical history , reviewing a separately obtained history, performing a medically appropriate examination and/or evaluation, counseling and educating the patient/family/caregiver, documenting clinical information in the electronic health record, and independently interpreting results (not separately reported/billed) and communicating results to the patient/family/caregiver  Janann Colonel, MD [facility] 11/08/2023 2:05 PM

## 2023-11-10 DIAGNOSIS — M47816 Spondylosis without myelopathy or radiculopathy, lumbar region: Secondary | ICD-10-CM | POA: Diagnosis not present

## 2023-11-13 DIAGNOSIS — M47816 Spondylosis without myelopathy or radiculopathy, lumbar region: Secondary | ICD-10-CM | POA: Diagnosis not present

## 2023-11-14 ENCOUNTER — Encounter: Payer: Self-pay | Admitting: Internal Medicine

## 2023-11-17 ENCOUNTER — Ambulatory Visit: Payer: Medicare PPO | Admitting: Physician Assistant

## 2023-11-17 ENCOUNTER — Ambulatory Visit: Payer: Medicare PPO | Admitting: Pulmonary Disease

## 2023-12-03 NOTE — Progress Notes (Unsigned)
  Electrophysiology Office Note:   Date:  12/04/2023  ID:  [name], DOB 21-Nov-1950, MRN [id number]  Primary Cardiologist: None Primary Heart Failure: None Electrophysiologist: Lewayne Bunting, MD      History of Present Illness:   [name] is a 73 y.o. female with h/o PAC's, HTN, dementia, relapsing remitting MS & mild bronchiectasis seen today for routine electrophysiology followup.   Since last being seen in our clinic the patient reports doing well. She denies symptoms of palpitations and is happy with her control with flecainide / propranolol.  she denies chest pain, palpitations, dyspnea, PND, orthopnea, nausea, vomiting, dizziness, syncope, edema, weight gain, or early satiety.   Review of systems complete and found to be negative unless listed in HPI.   EP Information / Studies Reviewed:    EKG is ordered today. Personal review as below.  EKG Interpretation Date/Time:  Monday December 04 2023 14:06:30 EST Ventricular Rate:  59 PR Interval:  204 QRS Duration:  96 QT Interval:  424 QTC Calculation: 419 R Axis:   86  Text Interpretation: Sinus bradycardia Confirmed by Canary Brim ([id number]) on 12/04/2023 2:24:12 PM   Studies:  ECHO 2017 > LVEF 60-65% Stress Myoview 2017 > LVEF 56%, low risk study  Coronary CT 02/2023 > calcium score 34.2 / 61st percentile for matched controls, non-obs CAD, non-specific interstitial changes consistent with interstitial pulm edema, atypical infection or ILD    Arrhythmia / AAD PAC's  Flecainide + propranolol > initiated ~ 02/2021         Physical Exam:   VS:  BP 110/60   Pulse (!) 59   Ht 5\' 6"  (1.676 m)   Wt 129 lb 12.8 oz (58.9 kg)   LMP  (LMP Unknown)   SpO2 97%   BMI 20.95 kg/m    Wt Readings from Last 3 Encounters:  12/04/23 129 lb 12.8 oz (58.9 kg)  11/08/23 133 lb 3.2 oz (60.4 kg)  11/07/23 135 lb 6.4 oz (61.4 kg)     GEN: Well nourished, well developed in no acute distress NECK: No JVD; No carotid  bruits CARDIAC: Regular rate and rhythm, no murmurs, rubs, gallops RESPIRATORY:  Clear to auscultation without rales, wheezing or rhonchi  ABDOMEN: Soft, non-tender, non-distended EXTREMITIES:  No edema; No deformity   ASSESSMENT AND PLAN:    Palpitations  PAC's  -continue flecainide & propranolol  -PR lengthening waxes / wanes > 166-263ms -EKG with NSR and stable PR/QRS > PR 204 ms / QRS 96 ms -low burden of palpitations / symptoms  LE Swelling, R>L -negative venous duplex 07/2023  -elevate legs  -compression stockings   Follow up with Dr. Ladona Ridgel  4 months   Signed, Canary Brim, NP-C, AGACNP-BC [facility] - Electrophysiology  12/04/2023, 2:24 PM

## 2023-12-04 ENCOUNTER — Encounter: Payer: Self-pay | Admitting: Pulmonary Disease

## 2023-12-04 ENCOUNTER — Ambulatory Visit: Payer: Medicare PPO | Attending: Pulmonary Disease | Admitting: Pulmonary Disease

## 2023-12-04 VITALS — BP 110/60 | HR 59 | Ht 66.0 in | Wt 129.8 lb

## 2023-12-04 DIAGNOSIS — I491 Atrial premature depolarization: Secondary | ICD-10-CM

## 2023-12-04 DIAGNOSIS — Z5181 Encounter for therapeutic drug level monitoring: Secondary | ICD-10-CM | POA: Diagnosis not present

## 2023-12-04 DIAGNOSIS — Z79899 Other long term (current) drug therapy: Secondary | ICD-10-CM | POA: Diagnosis not present

## 2023-12-04 DIAGNOSIS — R002 Palpitations: Secondary | ICD-10-CM | POA: Diagnosis not present

## 2023-12-04 NOTE — Patient Instructions (Signed)
 Medication Instructions:  Your physician recommends that you continue on your current medications as directed. Please refer to the Current Medication list given to you today.  *If you need a refill on your cardiac medications before your next appointment, please call your pharmacy*  Lab Work: None ordered If you have labs (blood work) drawn today and your tests are completely normal, you will receive your results only by: MyChart Message (if you have MyChart) OR A paper copy in the mail If you have any lab test that is abnormal or we need to change your treatment, we will call you to review the results.   Follow-Up: At [facility], you and your health needs are our priority.  As part of our continuing mission to provide you with exceptional heart care, we have created designated Provider Care Teams.  These Care Teams include your primary Cardiologist (physician) and Advanced Practice Providers (APPs -  Physician Assistants and Nurse Practitioners) who all work together to provide you with the care you need, when you need it.  Your next appointment:   4 month(s)  Provider:   Lewayne Bunting, MD

## 2023-12-11 DIAGNOSIS — G301 Alzheimer's disease with late onset: Secondary | ICD-10-CM | POA: Diagnosis not present

## 2023-12-11 DIAGNOSIS — G35 Multiple sclerosis: Secondary | ICD-10-CM | POA: Diagnosis not present

## 2023-12-11 DIAGNOSIS — F02A0 Dementia in other diseases classified elsewhere, mild, without behavioral disturbance, psychotic disturbance, mood disturbance, and anxiety: Secondary | ICD-10-CM | POA: Diagnosis not present

## 2023-12-11 DIAGNOSIS — R296 Repeated falls: Secondary | ICD-10-CM | POA: Diagnosis not present

## 2024-01-02 DIAGNOSIS — J45909 Unspecified asthma, uncomplicated: Secondary | ICD-10-CM | POA: Diagnosis not present

## 2024-01-02 DIAGNOSIS — F0390 Unspecified dementia without behavioral disturbance: Secondary | ICD-10-CM | POA: Diagnosis not present

## 2024-01-02 DIAGNOSIS — I491 Atrial premature depolarization: Secondary | ICD-10-CM | POA: Diagnosis not present

## 2024-01-02 DIAGNOSIS — F411 Generalized anxiety disorder: Secondary | ICD-10-CM | POA: Diagnosis not present

## 2024-01-02 DIAGNOSIS — G35 Multiple sclerosis: Secondary | ICD-10-CM | POA: Diagnosis not present

## 2024-01-02 DIAGNOSIS — M545 Low back pain, unspecified: Secondary | ICD-10-CM | POA: Diagnosis not present

## 2024-01-02 DIAGNOSIS — E782 Mixed hyperlipidemia: Secondary | ICD-10-CM | POA: Diagnosis not present

## 2024-01-15 DIAGNOSIS — H04123 Dry eye syndrome of bilateral lacrimal glands: Secondary | ICD-10-CM | POA: Diagnosis not present

## 2024-01-15 DIAGNOSIS — G35 Multiple sclerosis: Secondary | ICD-10-CM | POA: Diagnosis not present

## 2024-01-15 DIAGNOSIS — H43812 Vitreous degeneration, left eye: Secondary | ICD-10-CM | POA: Diagnosis not present

## 2024-01-15 DIAGNOSIS — Z0100 Encounter for examination of eyes and vision without abnormal findings: Secondary | ICD-10-CM | POA: Diagnosis not present

## 2024-01-15 DIAGNOSIS — H35342 Macular cyst, hole, or pseudohole, left eye: Secondary | ICD-10-CM | POA: Diagnosis not present
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
## 2024-01-29 DIAGNOSIS — D6869 Other thrombophilia: Secondary | ICD-10-CM | POA: Diagnosis not present

## 2024-01-29 DIAGNOSIS — Z9181 History of falling: Secondary | ICD-10-CM | POA: Diagnosis not present

## 2024-01-29 DIAGNOSIS — Z818 Family history of other mental and behavioral disorders: Secondary | ICD-10-CM | POA: Diagnosis not present

## 2024-01-29 DIAGNOSIS — Z5982 Transportation insecurity: Secondary | ICD-10-CM | POA: Diagnosis not present

## 2024-01-29 DIAGNOSIS — Z85828 Personal history of other malignant neoplasm of skin: Secondary | ICD-10-CM | POA: Diagnosis not present

## 2024-01-29 DIAGNOSIS — F324 Major depressive disorder, single episode, in partial remission: Secondary | ICD-10-CM | POA: Diagnosis not present

## 2024-01-29 DIAGNOSIS — E785 Hyperlipidemia, unspecified: Secondary | ICD-10-CM | POA: Diagnosis not present

## 2024-01-29 DIAGNOSIS — F411 Generalized anxiety disorder: Secondary | ICD-10-CM | POA: Diagnosis not present

## 2024-01-29 DIAGNOSIS — Z7951 Long term (current) use of inhaled steroids: Secondary | ICD-10-CM | POA: Diagnosis not present

## 2024-01-29 DIAGNOSIS — G309 Alzheimer's disease, unspecified: Secondary | ICD-10-CM | POA: Diagnosis not present

## 2024-01-29 DIAGNOSIS — M4800 Spinal stenosis, site unspecified: Secondary | ICD-10-CM | POA: Diagnosis not present

## 2024-01-29 DIAGNOSIS — M545 Low back pain, unspecified: Secondary | ICD-10-CM | POA: Diagnosis not present

## 2024-01-29 DIAGNOSIS — I10 Essential (primary) hypertension: Secondary | ICD-10-CM | POA: Diagnosis not present

## 2024-01-29 DIAGNOSIS — Z791 Long term (current) use of non-steroidal anti-inflammatories (NSAID): Secondary | ICD-10-CM | POA: Diagnosis not present

## 2024-01-29 DIAGNOSIS — I4891 Unspecified atrial fibrillation: Secondary | ICD-10-CM | POA: Diagnosis not present

## 2024-01-29 DIAGNOSIS — G35 Multiple sclerosis: Secondary | ICD-10-CM | POA: Diagnosis not present

## 2024-01-29 DIAGNOSIS — K219 Gastro-esophageal reflux disease without esophagitis: Secondary | ICD-10-CM | POA: Diagnosis not present

## 2024-01-29 DIAGNOSIS — M1990 Unspecified osteoarthritis, unspecified site: Secondary | ICD-10-CM | POA: Diagnosis not present

## 2024-02-06 ENCOUNTER — Encounter: Payer: Self-pay | Admitting: Internal Medicine

## 2024-02-06 ENCOUNTER — Ambulatory Visit: Payer: Medicare PPO | Attending: Internal Medicine | Admitting: Internal Medicine

## 2024-02-06 VITALS — BP 119/76 | HR 60 | Resp 14 | Ht 66.0 in | Wt 127.0 lb

## 2024-02-06 DIAGNOSIS — M810 Age-related osteoporosis without current pathological fracture: Secondary | ICD-10-CM | POA: Diagnosis not present

## 2024-02-06 DIAGNOSIS — G35 Multiple sclerosis: Secondary | ICD-10-CM | POA: Diagnosis not present

## 2024-02-19 DIAGNOSIS — L0390 Cellulitis, unspecified: Secondary | ICD-10-CM | POA: Diagnosis not present

## 2024-02-19 DIAGNOSIS — L988 Other specified disorders of the skin and subcutaneous tissue: Secondary | ICD-10-CM | POA: Diagnosis not present

## 2024-02-19 DIAGNOSIS — L570 Actinic keratosis: Secondary | ICD-10-CM | POA: Diagnosis not present

## 2024-02-21 ENCOUNTER — Other Ambulatory Visit: Payer: Self-pay | Admitting: Internal Medicine

## 2024-02-28 ENCOUNTER — Ambulatory Visit: Payer: Medicare PPO | Admitting: Pulmonary Disease

## 2024-02-28 ENCOUNTER — Encounter: Payer: Self-pay | Admitting: Pulmonary Disease

## 2024-02-28 VITALS — BP 100/50 | HR 56 | Temp 97.1°F | Ht 66.0 in | Wt 126.8 lb

## 2024-02-28 DIAGNOSIS — J4530 Mild persistent asthma, uncomplicated: Secondary | ICD-10-CM | POA: Diagnosis not present

## 2024-02-28 MED ORDER — LEVALBUTEROL TARTRATE 45 MCG/ACT IN AERO: 2.0000 | INHALATION_SPRAY | RESPIRATORY_TRACT | 12 refills | Status: AC | PRN

## 2024-02-28 MED ORDER — PANTOPRAZOLE SODIUM 40 MG PO TBEC: 40.0000 mg | DELAYED_RELEASE_TABLET | Freq: Every day | ORAL | 1 refills | Status: DC

## 2024-02-28 NOTE — Progress Notes (Signed)
 Synopsis: Referred in by Glena Landau, MD   Subjective:   PATIENT ID: [name] GENDER: female DOB: Jan 21, 1951, MRN: [id number]  Chief Complaint  Patient presents with   Follow-up    Cough is better.     HPI [name] is a pleasant 73 year old female patient with a past medical history of multiple sclerosis neurodeficit including difficulty ambulating, decreased vision and memory deficit, mild persistent asthma presenting today to the pulmonary clinic to establish care.  She saw Dr. Dione Franks a year ago when she presented with ongoing dry cough associated with shortness of breath.  She was prescribed Symbicort  which had helped her significantly.  PFT 09/24 with lower limit of normal FVC, lower limit of normal FEV1 and increased FEV1 to FVC ratio.  Normal lung volumes.  And mildly reduced DLCO.  No significant response to bronchodilators.  CT scan was obtained on 05/24 which did not show any signs of interstitial lung disease.  She is feeling well on Symbicort  80-4.52 puffs twice daily.  Reports rarely needing to use her rescue inhaler.  She is still experiencing inability to take a deep breath sometimes and needing to be on however that has been less frequent.  Family history -denies any family history of pulmonary diseases.  Social history -never smoker.  OV 02/28/2024 - [name] is here for follow up on her asthma. She was started on Symbicort  80 and is doing well on that. Her shortness of breath and dry cough have decreased significantly. She complains of intermittent heart burn and had a history of gerd. We discussed needing to go back on acid reflux medication. She is also describing blurry vision and havent seen her neurologist  in over a year, I recommended her to call the office for a follow up apointment.   ROS All systems were reviewed and are negative except for the above.  Objective:   Vitals:   02/28/24 1300  BP: (!) 100/50  Pulse: (!) 56  Temp: (!) 97.1 F  (36.2 C)  SpO2: 97%  Weight: 126 lb 12.8 oz (57.5 kg)  Height: 5\' 6"  (1.676 m)   97% on RA BMI Readings from Last 3 Encounters:  02/28/24 20.47 kg/m  02/06/24 20.50 kg/m  12/04/23 20.95 kg/m   Wt Readings from Last 3 Encounters:  02/28/24 126 lb 12.8 oz (57.5 kg)  02/06/24 127 lb (57.6 kg)  12/04/23 129 lb 12.8 oz (58.9 kg)    Physical Exam GEN: NAD, Healthy Appearing HEENT: Supple Neck, Reactive Pupils, EOMI  CVS: Normal S1, Normal S2, RRR, No murmurs or ES appreciated  Lungs: Clear bilateral air entry.  Abdomen: Soft, non tender, non distended, + BS  Extremities: Warm and well perfused, No edema  Skin: No suspicious lesions appreciated  Psych: Normal Affect  Ancillary Information   CBC    Component Value Date/Time   WBC 5.8 04/19/2023 1144   RBC 4.42 04/19/2023 1144   HGB 13.2 04/19/2023 1144   HGB 12.2 09/20/2019 1052   HCT 39.9 04/19/2023 1144   HCT 36.3 09/20/2019 1052   PLT 158 04/19/2023 1144   PLT 183 09/20/2019 1052   MCV 90.3 04/19/2023 1144   MCV 91 09/20/2019 1052   MCH 29.9 04/19/2023 1144   MCHC 33.1 04/19/2023 1144   RDW 13.2 04/19/2023 1144   RDW 12.8 09/20/2019 1052   LYMPHSABS 1.7 04/19/2023 1144   MONOABS 0.4 04/19/2023 1144   EOSABS 0.1 04/19/2023 1144   BASOSABS 0.1 04/19/2023 1144   Labs  and imaging were reviewed.     Latest Ref Rng & Units 06/13/2023    2:33 PM  PFT Results  FVC-Pre L 2.53   FVC-Predicted Pre % 77   FVC-Post L 2.38   FVC-Predicted Post % 72   Pre FEV1/FVC % % 81   Post FEV1/FCV % % 82   FEV1-Pre L 2.04   FEV1-Predicted Pre % 82   FEV1-Post L 1.95   DLCO uncorrected ml/min/mmHg 15.13   DLCO UNC% % 72   DLVA Predicted % 90   TLC L 4.38   TLC % Predicted % 81   RV % Predicted % 77     Assessment & Plan:  [name] is a pleasant 73 year old female patient with a past medical history of multiple sclerosis neurodeficit including difficulty ambulating, decreased vision and memory deficit, mild persistent  asthma presenting today to the pulmonary clinic to establish care.  #Mild persistent asthma   []  Agree w/ Budesonide -Formoterol  [Symbicort ] 80-4.5 2 puffs twice a day. And advised use of 2puffs PRN as well when needed.  []  High Res CT chest as prior one showed some signs of ILD.  []  Levalbuterol as needed.  []  PPI  []  I will see her back in 6 months and if need be will go up on her symbicort .   MS - followed clinically with Dr. Vivi Grit zeid.   RTC 6 months.   I spent 20 minutes caring for this patient today, including preparing to see the patient, obtaining a medical history , reviewing a separately obtained history, performing a medically appropriate examination and/or evaluation, counseling and educating the patient/family/caregiver, documenting clinical information in the electronic health record, and independently interpreting results (not separately reported/billed) and communicating results to the patient/family/caregiver  Annitta Kindler, MD [facility] 02/28/2024 1:11 PM

## 2024-03-12 DIAGNOSIS — G35 Multiple sclerosis: Secondary | ICD-10-CM | POA: Diagnosis not present

## 2024-03-19 DIAGNOSIS — G35 Multiple sclerosis: Secondary | ICD-10-CM | POA: Diagnosis not present

## 2024-03-19 DIAGNOSIS — R2681 Unsteadiness on feet: Secondary | ICD-10-CM | POA: Diagnosis not present

## 2024-03-19 DIAGNOSIS — R5383 Other fatigue: Secondary | ICD-10-CM | POA: Diagnosis not present

## 2024-03-19 DIAGNOSIS — R2689 Other abnormalities of gait and mobility: Secondary | ICD-10-CM | POA: Diagnosis not present

## 2024-03-22 DIAGNOSIS — G35 Multiple sclerosis: Secondary | ICD-10-CM | POA: Diagnosis not present

## 2024-03-22 DIAGNOSIS — R5383 Other fatigue: Secondary | ICD-10-CM | POA: Diagnosis not present

## 2024-03-22 DIAGNOSIS — R2689 Other abnormalities of gait and mobility: Secondary | ICD-10-CM | POA: Diagnosis not present

## 2024-03-22 DIAGNOSIS — R2681 Unsteadiness on feet: Secondary | ICD-10-CM | POA: Diagnosis not present

## 2024-03-23 ENCOUNTER — Other Ambulatory Visit: Payer: Self-pay | Admitting: Pulmonary Disease

## 2024-03-23 DIAGNOSIS — J4530 Mild persistent asthma, uncomplicated: Secondary | ICD-10-CM

## 2024-03-24 ENCOUNTER — Other Ambulatory Visit: Payer: Self-pay | Admitting: Pulmonary Disease

## 2024-03-24 ENCOUNTER — Telehealth: Payer: Self-pay | Admitting: Pulmonary Disease

## 2024-03-24 DIAGNOSIS — J4530 Mild persistent asthma, uncomplicated: Secondary | ICD-10-CM

## 2024-03-24 MED ORDER — ALBUTEROL SULFATE HFA 108 (90 BASE) MCG/ACT IN AERS
2.0000 | INHALATION_SPRAY | Freq: Four times a day (QID) | RESPIRATORY_TRACT | 2 refills | Status: AC | PRN
Start: 2024-03-24 — End: ?

## 2024-03-24 MED ORDER — PREDNISONE 20 MG PO TABS: 20.0000 mg | ORAL_TABLET | Freq: Every day | ORAL | 0 refills | Status: AC

## 2024-03-24 NOTE — Progress Notes (Signed)
 Prescription for albuterol  called to pharmacy

## 2024-03-24 NOTE — Telephone Encounter (Signed)
 Pt called back answering service  Picked up the albuterol  but does not feel the albuterol  is helping much She does have some chest tightness  Does not have a cough, does not have a fever  Will call in prednisone  20 daily for 7 days  Encouraged to call pulmonary office in a.m. if still not feeling any better

## 2024-03-24 NOTE — Telephone Encounter (Signed)
 Patient called answering service for difficulty breathing  Has been using Symbicort  Does not have a rescue inhaler - Rescue inhaler will be called to pharmacy

## 2024-03-26 ENCOUNTER — Telehealth (HOSPITAL_BASED_OUTPATIENT_CLINIC_OR_DEPARTMENT_OTHER): Payer: Self-pay

## 2024-03-26 DIAGNOSIS — R2689 Other abnormalities of gait and mobility: Secondary | ICD-10-CM | POA: Diagnosis not present

## 2024-03-26 DIAGNOSIS — R5383 Other fatigue: Secondary | ICD-10-CM | POA: Diagnosis not present

## 2024-03-26 DIAGNOSIS — R2681 Unsteadiness on feet: Secondary | ICD-10-CM | POA: Diagnosis not present

## 2024-03-26 DIAGNOSIS — G35 Multiple sclerosis: Secondary | ICD-10-CM | POA: Diagnosis not present

## 2024-03-26 NOTE — Telephone Encounter (Signed)
 Copied from CRM [id number]. Topic: General - Other >> Mar 25, 2024  8:44 AM Margarette Shawl wrote: Reason for CRM:   Saturday night was unable to breath Still having issues the following morning. Spoke to on call physician, Dr. Gaynell Keeler, who prescribed prednisone , pantoprazole  and sent prescription for albuterol . Albuterol  has helped, will start prednisone  this morning.  Symptoms have improved and would like to notify provider of issues in case further evaluation is needed.   CB#   [phone]

## 2024-03-27 ENCOUNTER — Ambulatory Visit: Payer: Medicare PPO | Attending: Cardiology | Admitting: Internal Medicine

## 2024-03-27 ENCOUNTER — Encounter: Payer: Self-pay | Admitting: Internal Medicine

## 2024-03-27 VITALS — BP 118/74 | HR 54 | Ht 66.0 in | Wt 127.0 lb

## 2024-03-27 DIAGNOSIS — I491 Atrial premature depolarization: Secondary | ICD-10-CM | POA: Diagnosis not present

## 2024-03-27 NOTE — Patient Instructions (Signed)
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
## 2024-03-28 DIAGNOSIS — G35 Multiple sclerosis: Secondary | ICD-10-CM | POA: Diagnosis not present

## 2024-03-28 DIAGNOSIS — R519 Headache, unspecified: Secondary | ICD-10-CM | POA: Diagnosis not present

## 2024-03-28 DIAGNOSIS — F0390 Unspecified dementia without behavioral disturbance: Secondary | ICD-10-CM | POA: Diagnosis not present

## 2024-04-08 DIAGNOSIS — R2689 Other abnormalities of gait and mobility: Secondary | ICD-10-CM | POA: Diagnosis not present

## 2024-04-08 DIAGNOSIS — R5383 Other fatigue: Secondary | ICD-10-CM | POA: Diagnosis not present

## 2024-04-08 DIAGNOSIS — G35 Multiple sclerosis: Secondary | ICD-10-CM | POA: Diagnosis not present

## 2024-04-08 DIAGNOSIS — R2681 Unsteadiness on feet: Secondary | ICD-10-CM | POA: Diagnosis not present

## 2024-04-09 ENCOUNTER — Encounter: Payer: Self-pay | Admitting: Emergency Medicine

## 2024-04-09 ENCOUNTER — Emergency Department

## 2024-04-09 ENCOUNTER — Other Ambulatory Visit: Payer: Self-pay

## 2024-04-09 ENCOUNTER — Observation Stay
Admission: EM | Admit: 2024-04-09 | Discharge: 2024-04-11 | Disposition: A | Discharge status: Home Health | Attending: Internal Medicine | Admitting: Internal Medicine

## 2024-04-09 ENCOUNTER — Other Ambulatory Visit: Payer: Self-pay | Admitting: Family Medicine

## 2024-04-09 DIAGNOSIS — R059 Cough, unspecified: Secondary | ICD-10-CM | POA: Diagnosis not present

## 2024-04-09 DIAGNOSIS — I4891 Unspecified atrial fibrillation: Secondary | ICD-10-CM | POA: Insufficient documentation

## 2024-04-09 DIAGNOSIS — Z1231 Encounter for screening mammogram for malignant neoplasm of breast: Secondary | ICD-10-CM

## 2024-04-09 DIAGNOSIS — G319 Degenerative disease of nervous system, unspecified: Secondary | ICD-10-CM | POA: Diagnosis not present

## 2024-04-09 DIAGNOSIS — I10 Essential (primary) hypertension: Secondary | ICD-10-CM | POA: Diagnosis not present

## 2024-04-09 DIAGNOSIS — I491 Atrial premature depolarization: Secondary | ICD-10-CM | POA: Diagnosis present

## 2024-04-09 DIAGNOSIS — R079 Chest pain, unspecified: Secondary | ICD-10-CM | POA: Diagnosis not present

## 2024-04-09 DIAGNOSIS — G35 Multiple sclerosis: Secondary | ICD-10-CM | POA: Diagnosis not present

## 2024-04-09 DIAGNOSIS — R262 Difficulty in walking, not elsewhere classified: Principal | ICD-10-CM | POA: Diagnosis present

## 2024-04-09 DIAGNOSIS — Z79899 Other long term (current) drug therapy: Secondary | ICD-10-CM | POA: Diagnosis not present

## 2024-04-09 DIAGNOSIS — R270 Ataxia, unspecified: Secondary | ICD-10-CM | POA: Diagnosis not present

## 2024-04-09 DIAGNOSIS — R0789 Other chest pain: Principal | ICD-10-CM | POA: Insufficient documentation

## 2024-04-09 DIAGNOSIS — J45909 Unspecified asthma, uncomplicated: Secondary | ICD-10-CM | POA: Diagnosis not present

## 2024-04-09 HISTORY — DX: Unspecified atrial fibrillation: I48.91

## 2024-04-09 LAB — HEPATIC FUNCTION PANEL
ALT: 17 U/L (ref 0–44)
AST: 22 U/L (ref 15–41)
Albumin: 3.3 g/dL — ABNORMAL LOW (ref 3.5–5.0)
Alkaline Phosphatase: 37 U/L — ABNORMAL LOW (ref 38–126)
Bilirubin, Direct: <0.1 mg/dL (ref 0.0–0.2)
Total Bilirubin: 0.8 mg/dL (ref 0.0–1.2)
Total Protein: 6 g/dL — ABNORMAL LOW (ref 6.5–8.1)

## 2024-04-09 LAB — CBC
HCT: 38 % (ref 36.0–46.0)
Hemoglobin: 12.4 g/dL (ref 12.0–15.0)
MCH: 31.2 pg (ref 26.0–34.0)
MCHC: 32.6 g/dL (ref 30.0–36.0)
MCV: 95.7 fL (ref 80.0–100.0)
Platelets: 173 10*3/uL (ref 150–400)
RBC: 3.97 MIL/uL (ref 3.87–5.11)
RDW: 12.7 % (ref 11.5–15.5)
WBC: 7.5 10*3/uL (ref 4.0–10.5)
nRBC: 0 % (ref 0.0–0.2)

## 2024-04-09 LAB — BASIC METABOLIC PANEL WITH GFR
Anion gap: 7 (ref 5–15)
BUN: 23 mg/dL (ref 8–23)
CO2: 27 mmol/L (ref 22–32)
Calcium: 9.1 mg/dL (ref 8.9–10.3)
Chloride: 109 mmol/L (ref 98–111)
Creatinine, Ser: 0.78 mg/dL (ref 0.44–1.00)
GFR, Estimated: >60 mL/min (ref >60)
Glucose, Bld: 92 mg/dL (ref 70–99)
Potassium: 3.6 mmol/L (ref 3.5–5.1)
Sodium: 143 mmol/L (ref 135–145)

## 2024-04-09 LAB — LIPASE, BLOOD: Lipase: 42 U/L (ref 11–51)

## 2024-04-09 LAB — TROPONIN I (HIGH SENSITIVITY)
Troponin I (High Sensitivity): 4 ng/L (ref <18)
Troponin I (High Sensitivity): 4 ng/L (ref <18)

## 2024-04-09 MED ORDER — MECLIZINE HCL 25 MG PO TABS
25.0000 mg | ORAL_TABLET | Freq: Once | ORAL | Status: AC
  Administered 2024-04-09: 25 mg via ORAL
  Filled 2024-04-09: qty 1

## 2024-04-09 MED ORDER — ONDANSETRON HCL 4 MG/2ML IJ SOLN
4.0000 mg | Freq: Once | INTRAMUSCULAR | Status: AC
  Administered 2024-04-09: 4 mg via INTRAVENOUS
  Filled 2024-04-09: qty 2

## 2024-04-09 MED ORDER — LIDOCAINE VISCOUS HCL 2 % MT SOLN
15.0000 mL | Freq: Once | OROMUCOSAL | Status: AC
  Administered 2024-04-09: 15 mL via ORAL
  Filled 2024-04-09: qty 15

## 2024-04-09 MED ORDER — PANTOPRAZOLE SODIUM 20 MG PO TBEC
20.0000 mg | DELAYED_RELEASE_TABLET | Freq: Once | ORAL | Status: AC
  Administered 2024-04-09: 20 mg via ORAL
  Filled 2024-04-09: qty 1

## 2024-04-09 MED ORDER — ALUM & MAG HYDROXIDE-SIMETH 200-200-20 MG/5ML PO SUSP
30.0000 mL | Freq: Once | ORAL | Status: AC
  Administered 2024-04-09: 30 mL via ORAL
  Filled 2024-04-09: qty 30

## 2024-04-09 MED ORDER — SODIUM CHLORIDE 0.9 % IV BOLUS
500.0000 mL | Freq: Once | INTRAVENOUS | Status: AC
  Administered 2024-04-09: 500 mL via INTRAVENOUS

## 2024-04-09 MED ORDER — GADOBUTROL 1 MMOL/ML IV SOLN
6.0000 mL | Freq: Once | INTRAVENOUS | Status: AC | PRN
  Administered 2024-04-09: 6 mL via INTRAVENOUS

## 2024-04-09 NOTE — ED Triage Notes (Signed)
 Patient to ED via POV from home for centralized CP. Started today with a cough. Non-radiating pain. HX of MS and afib.

## 2024-04-09 NOTE — ED Notes (Addendum)
 Pt starting to have leg spasms.. Stated its getting worse

## 2024-04-09 NOTE — ED Provider Notes (Signed)
 [facility] Provider Note    Event Date/Time   First MD Initiated Contact with Patient 04/09/24 1942     (approximate)   History   Chest Pain  Patient to ED via POV from home for centralized CP. Started today with a cough. Non-radiating pain. HX of MS and afib.   HPI [name] is a 73 y.o. female PMH hyperlipidemia, GERD, multiple sclerosis, IBS, asthma, ?A-fib not on anticoagulation presents for evaluation of chest discomfort, dizziness, difficulty walking - Patient states earlier this afternoon around 2 PM she was sitting when she started developing a burning substernal chest discomfort and feeling weird.  This persisted for several hours and is ongoing at this time despite Tums.  No shortness of breath.  Does also note that anytime she tends to walk her legs feel very weak and that her toes are buzzing - Has had notable difficulty walking since around time of symptom onset, usually ambulatory without any assistance.  Says she feels that her legs are unsteady endorses some vague dizziness   Per chart review, patient was last seen in cardiology clinic on 03/27/2024 for evaluation of palpitations and atypical chest pain.  Overall felt to have had symptomatic PACs, continued low-dose flecainide .  Last CT chest in May 2024, no underlying aortic aneurysm at that time.     Physical Exam   Triage Vital Signs: ED Triage Vitals  Encounter Vitals Group     BP 04/09/24 1801 (!) 167/76     Girls Systolic BP Percentile --      Girls Diastolic BP Percentile --      Boys Systolic BP Percentile --      Boys Diastolic BP Percentile --      Pulse Rate 04/09/24 1801 62     Resp 04/09/24 1801 17     Temp 04/09/24 1801 98.1 F (36.7 C)     Temp Source 04/09/24 1801 Oral     SpO2 04/09/24 1801 100 %     Weight 04/09/24 1804 128 lb (58.1 kg)     Height 04/09/24 1804 5' 5 (1.651 m)     Head Circumference --      Peak Flow --      Pain Score 04/09/24 1804 2      Pain Loc --      Pain Education --      Exclude from Growth Chart --     Most recent vital signs: Vitals:   04/09/24 1801  BP: (!) 167/76  Pulse: 62  Resp: 17  Temp: 98.1 F (36.7 C)  SpO2: 100%     General: Awake, no distress.  CV:  Good peripheral perfusion. RRR, equal pulses in all extremities Resp:  Normal effort. CTAB Abd:  No distention. Nontender to deep palpation throughout Neuro:  Aox4, CN II-XII intact, FNF with ataxia RUE (normal LUE), finger taps fast b/l, 5/5 strength in bilateral finger extension/grip, EHL/FHL. BUE AG 10+ sec no drift, BLE AG 5+ sec no drift.  She does have very unsteady / ataxic gait.  SI LT.    ED Results / Procedures / Treatments   Labs (all labs ordered are listed, but only abnormal results are displayed) Labs Reviewed  HEPATIC FUNCTION PANEL - Abnormal; Notable for the following components:      Result Value   Total Protein 6.0 (*)    Albumin 3.3 (*)    Alkaline Phosphatase 37 (*)    All other components within normal limits  BASIC METABOLIC PANEL WITH GFR  CBC  LIPASE, BLOOD  URINALYSIS, COMPLETE (UACMP) WITH MICROSCOPIC  TROPONIN I (HIGH SENSITIVITY)  TROPONIN I (HIGH SENSITIVITY)     EKG  See ED course below   RADIOLOGY Chest x-ray interpreted by myself and radiology report reviewed, unremarkable.  MRI pending.   PROCEDURES:  Critical Care performed: No  Procedures   MEDICATIONS ORDERED IN ED: Medications  sodium chloride  0.9 % bolus 500 mL (0 mLs Intravenous Stopped 04/09/24 2301)  ondansetron  (ZOFRAN ) injection 4 mg (4 mg Intravenous Given 04/09/24 2127)  meclizine (ANTIVERT) tablet 25 mg (25 mg Oral Given 04/09/24 2128)  alum & mag hydroxide-simeth (MAALOX/MYLANTA) 200-200-20 MG/5ML suspension 30 mL (30 mLs Oral Given 04/09/24 2129)    And  lidocaine  (XYLOCAINE ) 2 % viscous mouth solution 15 mL (15 mLs Oral Given 04/09/24 2129)  pantoprazole  (PROTONIX ) EC tablet 20 mg (20 mg Oral Given 04/09/24 2210)   gadobutrol  (GADAVIST ) 1 MMOL/ML injection 6 mL (6 mLs Intravenous Contrast Given 04/09/24 2322)     IMPRESSION / MDM / ASSESSMENT AND PLAN / ED COURSE  I reviewed the triage vital signs and the nursing notes.                              DDX/MDM/AP: Differential diagnosis includes, but is not limited to, ACS, GERD, anxiety, doubt hepatobiliary pathology, do not clinically suspect aortic dissection or pulmonary embolism at this time.  With regard to patient's limited ability to walk, unclear etiology hyponatremia consider MS flare, CVA (outside of tPA window), will also screen for underlying electrolyte abnormality or UTI.  Plan: - Labs - EKG - IV fluid - Zofran , meclizine, GI cocktail - MRI brain with and without contrast - Reassess  Patient's presentation is most consistent with acute presentation with potential threat to life or bodily function.  The patient is on the cardiac monitor to evaluate for evidence of arrhythmia and/or significant heart rate changes.  ED course below.  Cardiac workup unremarkable.  Chest discomfort improved with GI cocktail the patient remains notably unsteady on her feet and-overall unclear etiology, consider possible CVA or MS flare.  Given new severe gait instability with inability to ambulate, I do believe patient warrants consideration of admission for further evaluation and management.  Signed out to oncoming ED provider pending results of MRI and discussion with hospitalist team.  Clinical Course as of 04/10/24 0000  Tue Apr 09, 2024  2040 Cbc, bmp wnl Trop wnl  [MM]  2042 Chest x-ray reviewed, unremarkable on my interpretation, radiology report unremarkable. [MM]  2042 Ecg = sinus rhythm, rate 63, no gross ST elevation or depression, isolated T wave inversion in lead III is nonspecific, normal axis, normal intervals.  No clear evidence of ischemia nor arrhythmia on my interpretation.  Some baseline artifact limits interpretation. [MM]  2108 Rpt  trop stable [MM]  2324 Patient remains very unsteady with her gait which is far off of her baseline.  Will discuss with hospitalist-- paged  MRI pending. [MM]    Clinical Course User Index [MM] Clarine Ozell LABOR, MD     FINAL CLINICAL IMPRESSION(S) / ED DIAGNOSES   Final diagnoses:  Difficulty walking  Chest pain, unspecified type  Ataxia     Rx / DC Orders   ED Discharge Orders     None        Note:  This document was prepared using Dragon voice recognition software and may include  unintentional dictation errors.   Clarine Ozell LABOR, MD 04/10/24 0000

## 2024-04-10 ENCOUNTER — Observation Stay
Admit: 2024-04-10 | Discharge: 2024-04-10 | Disposition: A | Discharge status: Home / Self Care | Attending: Internal Medicine

## 2024-04-10 DIAGNOSIS — R262 Difficulty in walking, not elsewhere classified: Secondary | ICD-10-CM | POA: Diagnosis not present

## 2024-04-10 DIAGNOSIS — R079 Chest pain, unspecified: Secondary | ICD-10-CM

## 2024-04-10 DIAGNOSIS — I491 Atrial premature depolarization: Secondary | ICD-10-CM

## 2024-04-10 LAB — HEMOGLOBIN A1C
Hgb A1c MFr Bld: 5.4 % (ref 4.8–5.6)
Mean Plasma Glucose: 108.28 mg/dL

## 2024-04-10 LAB — URINALYSIS, COMPLETE (UACMP) WITH MICROSCOPIC
Bacteria, UA: NONE SEEN
Bilirubin Urine: NEGATIVE
Glucose, UA: NEGATIVE mg/dL
Hgb urine dipstick: NEGATIVE
Ketones, ur: NEGATIVE mg/dL
Leukocytes,Ua: NEGATIVE
Nitrite: NEGATIVE
Protein, ur: NEGATIVE mg/dL
Specific Gravity, Urine: 1.008 (ref 1.005–1.030)
Squamous Epithelial / HPF: 0 /HPF (ref 0–5)
pH: 7 (ref 5.0–8.0)

## 2024-04-10 LAB — ECHOCARDIOGRAM COMPLETE
AR max vel: 2.19 cm2
AV Area VTI: 2.15 cm2
AV Area mean vel: 2.1 cm2
AV Mean grad: 3 mmHg
AV Peak grad: 6.1 mmHg
Ao pk vel: 1.23 m/s
Area-P 1/2: 3.21 cm2
Calc EF: 57.3 %
Height: 65 in
MV VTI: 2.08 cm2
S' Lateral: 2.6 cm
Single Plane A2C EF: 59.6 %
Single Plane A4C EF: 53.4 %
Weight: 2048 [oz_av]

## 2024-04-10 LAB — HIV ANTIBODY (ROUTINE TESTING W REFLEX): HIV Screen 4th Generation wRfx: NONREACTIVE

## 2024-04-10 LAB — LIPID PANEL
Cholesterol: 170 mg/dL (ref 0–200)
HDL: 49 mg/dL (ref >40)
LDL Cholesterol: 102 mg/dL — ABNORMAL HIGH (ref 0–99)
Total CHOL/HDL Ratio: 3.5 ratio
Triglycerides: 96 mg/dL (ref <150)
VLDL: 19 mg/dL (ref 0–40)

## 2024-04-10 MED ORDER — SENNOSIDES-DOCUSATE SODIUM 8.6-50 MG PO TABS: 1.0000 | ORAL_TABLET | Freq: Every evening | ORAL | Status: DC | PRN

## 2024-04-10 MED ORDER — ACETAMINOPHEN 325 MG PO TABS: 650.0000 mg | ORAL_TABLET | ORAL | Status: DC | PRN

## 2024-04-10 MED ORDER — EZETIMIBE 10 MG PO TABS
10.0000 mg | ORAL_TABLET | Freq: Every day | ORAL | Status: DC
  Administered 2024-04-10 – 2024-04-11 (×2): 10 mg via ORAL
  Filled 2024-04-10 (×2): qty 1

## 2024-04-10 MED ORDER — ONDANSETRON HCL 4 MG/2ML IJ SOLN: 4.0000 mg | Freq: Four times a day (QID) | INTRAMUSCULAR | Status: DC | PRN

## 2024-04-10 MED ORDER — ACETAMINOPHEN 325 MG PO TABS
650.0000 mg | ORAL_TABLET | ORAL | Status: DC | PRN
Start: 2024-04-10 — End: 2024-04-11

## 2024-04-10 MED ORDER — MEMANTINE HCL 5 MG PO TABS
5.0000 mg | ORAL_TABLET | Freq: Two times a day (BID) | ORAL | Status: DC
  Administered 2024-04-10 – 2024-04-11 (×3): 5 mg via ORAL
  Filled 2024-04-10 (×3): qty 1

## 2024-04-10 MED ORDER — ENOXAPARIN SODIUM 40 MG/0.4ML IJ SOSY: 40.0000 mg | PREFILLED_SYRINGE | INTRAMUSCULAR | Status: DC

## 2024-04-10 MED ORDER — ALBUTEROL SULFATE (2.5 MG/3ML) 0.083% IN NEBU: 3.0000 mL | INHALATION_SOLUTION | Freq: Four times a day (QID) | RESPIRATORY_TRACT | Status: DC | PRN

## 2024-04-10 MED ORDER — SODIUM CHLORIDE 0.9 % IV SOLN: INTRAVENOUS | Status: DC

## 2024-04-10 MED ORDER — BUDESONIDE 0.25 MG/2ML IN SUSP
0.2500 mg | Freq: Two times a day (BID) | RESPIRATORY_TRACT | Status: DC
  Administered 2024-04-10 – 2024-04-11 (×3): 0.25 mg via RESPIRATORY_TRACT
  Filled 2024-04-10 (×4): qty 2

## 2024-04-10 MED ORDER — FLECAINIDE ACETATE 50 MG PO TABS
50.0000 mg | ORAL_TABLET | Freq: Two times a day (BID) | ORAL | Status: DC
  Administered 2024-04-10 – 2024-04-11 (×3): 50 mg via ORAL
  Filled 2024-04-10 (×4): qty 1

## 2024-04-10 MED ORDER — STROKE: EARLY STAGES OF RECOVERY BOOK: Freq: Once | Status: AC

## 2024-04-10 MED ORDER — PRAVASTATIN SODIUM 20 MG PO TABS
20.0000 mg | ORAL_TABLET | Freq: Every day | ORAL | Status: DC
  Administered 2024-04-10: 20 mg via ORAL
  Filled 2024-04-10: qty 1

## 2024-04-10 MED ORDER — ENOXAPARIN SODIUM 40 MG/0.4ML IJ SOSY
40.0000 mg | PREFILLED_SYRINGE | INTRAMUSCULAR | Status: DC
  Administered 2024-04-10 – 2024-04-11 (×2): 40 mg via SUBCUTANEOUS
  Filled 2024-04-10 (×2): qty 0.4

## 2024-04-10 MED ORDER — VITAMIN D 25 MCG (1000 UNIT) PO TABS
1000.0000 [IU] | ORAL_TABLET | Freq: Every day | ORAL | Status: DC
  Administered 2024-04-10 – 2024-04-11 (×2): 1000 [IU] via ORAL
  Filled 2024-04-10 (×2): qty 1

## 2024-04-10 MED ORDER — ACETAMINOPHEN 650 MG RE SUPP: 650.0000 mg | RECTAL | Status: DC | PRN

## 2024-04-10 MED ORDER — PREGABALIN 75 MG PO CAPS
75.0000 mg | ORAL_CAPSULE | Freq: Three times a day (TID) | ORAL | Status: DC
  Administered 2024-04-10 – 2024-04-11 (×4): 75 mg via ORAL
  Filled 2024-04-10 (×4): qty 1

## 2024-04-10 MED ORDER — PROPRANOLOL HCL ER 60 MG PO CP24: 60.0000 mg | ORAL_CAPSULE | Freq: Every day | ORAL | Status: DC

## 2024-04-10 MED ORDER — PANTOPRAZOLE SODIUM 40 MG PO TBEC
40.0000 mg | DELAYED_RELEASE_TABLET | Freq: Every day | ORAL | Status: DC
  Administered 2024-04-10 – 2024-04-11 (×2): 40 mg via ORAL
  Filled 2024-04-10 (×2): qty 1

## 2024-04-10 MED ORDER — TRAZODONE HCL 50 MG PO TABS: 25.0000 mg | ORAL_TABLET | Freq: Every evening | ORAL | Status: DC | PRN

## 2024-04-10 MED ORDER — RIVASTIGMINE TARTRATE 3 MG PO CAPS
6.0000 mg | ORAL_CAPSULE | Freq: Two times a day (BID) | ORAL | Status: DC
  Administered 2024-04-10 – 2024-04-11 (×3): 6 mg via ORAL
  Filled 2024-04-10 (×4): qty 2

## 2024-04-10 MED ORDER — ALPRAZOLAM 0.25 MG PO TABS: 0.2500 mg | ORAL_TABLET | Freq: Two times a day (BID) | ORAL | Status: DC | PRN

## 2024-04-10 MED ORDER — ASPIRIN 81 MG PO TBEC
81.0000 mg | DELAYED_RELEASE_TABLET | Freq: Every day | ORAL | Status: DC
  Administered 2024-04-10 – 2024-04-11 (×2): 81 mg via ORAL
  Filled 2024-04-10 (×2): qty 1

## 2024-04-10 MED ORDER — ACETAMINOPHEN 160 MG/5ML PO SOLN: 650.0000 mg | ORAL | Status: DC | PRN

## 2024-04-10 MED ORDER — ARFORMOTEROL TARTRATE 15 MCG/2ML IN NEBU
15.0000 ug | INHALATION_SOLUTION | Freq: Two times a day (BID) | RESPIRATORY_TRACT | Status: DC
  Administered 2024-04-10: 15 ug via RESPIRATORY_TRACT
  Filled 2024-04-10 (×4): qty 2

## 2024-04-10 MED ORDER — PREDNISONE 20 MG PO TABS
20.0000 mg | ORAL_TABLET | Freq: Every day | ORAL | Status: DC
  Administered 2024-04-10 – 2024-04-11 (×2): 20 mg via ORAL
  Filled 2024-04-10 (×2): qty 1

## 2024-04-10 MED ORDER — DULOXETINE HCL 20 MG PO CPEP
20.0000 mg | ORAL_CAPSULE | Freq: Every day | ORAL | Status: DC
  Administered 2024-04-10 – 2024-04-11 (×2): 20 mg via ORAL
  Filled 2024-04-10 (×2): qty 1

## 2024-04-10 NOTE — Evaluation (Signed)
 Physical Therapy Evaluation Patient Details Name: [name] MRN: [id number] DOB: [birth year] Today's Date: 04/10/2024  History of Present Illness  Pt is a 73 y.o. female with PMH hyperlipidemia, GERD, multiple sclerosis, IBS, asthma, A-fib not on anticoagulation who presented to the ED with chest discomfort, dizziness, and difficulty walking. MD assessment includes difficulty walking, chest pain, and ataxia.  Clinical Impression  Pt was pleasantly confused and motivated to participate during the session and put forth good effort throughout. Pt required CGA for STS and ambulation with RW. Throughout ambulation, pt required occasional minA to guide RW to keep from running into walls/objects. Pt required VC's for proper hand placement to complete STS. Pt ambulated at a decreased cadence and narrow BOS. Pt reported no adverse symptoms during the session with SpO2 and HR WNL throughout on room air. Pt will benefit from continued PT services upon discharge to safely address deficits listed in patient problem list for decreased caregiver assistance and eventual return to PLOF.        If plan is discharge home, recommend the following: A little help with walking and/or transfers;Assistance with cooking/housework;Direct supervision/assist for medications management;Assist for transportation;Supervision due to cognitive status;Help with stairs or ramp for entrance;Direct supervision/assist for financial management;A little help with bathing/dressing/bathroom   Can travel by private vehicle   Yes    Equipment Recommendations Other (comment) (TBD at next venue of care)  Recommendations for Other Services       Functional Status Assessment Patient has had a recent decline in their functional status and demonstrates the ability to make significant improvements in function in a reasonable and predictable amount of time.     Precautions / Restrictions Precautions Precautions: Fall Recall of  Precautions/Restrictions: Impaired Restrictions Weight Bearing Restrictions Per Provider Order: No      Mobility  Bed Mobility Overal bed mobility: Independent             General bed mobility comments: Pt was independent with bed mobility tasks and did not require physical assistance or use of bedrails    Transfers Overall transfer level: Needs assistance Equipment used: Rolling walker (2 wheels) Transfers: Sit to/from Stand Sit to Stand: Contact guard assist           General transfer comment: Pt required CGA to complete STS with RW, but remained steady throughout and no LOBs occured. VC's were utilized for proper hand placement.     Ambulation/Gait Ambulation/Gait assistance: Contact guard assist; Min assist Gait Distance (Feet): 80 Feet Assistive device: Rolling walker (2 wheels) Gait Pattern/deviations: Step-to pattern, Shuffle, Decreased step length - right, Decreased step length - left, Narrow base of support Gait velocity: decreased     General Gait Details: Pt required CGA for ambulation with RW. Pt demonstrated very minimal step length bilat and narrow BOS. Pt also demonstrated minimal heel strike and was occasionally shuffling feet across floor. Pt required occasional minA to guide RW to keep from going into walls/objects.   Stairs            Wheelchair Mobility     Tilt Bed    Modified Rankin (Stroke Patients Only)       Balance Overall balance assessment: Needs assistance Sitting-balance support: Feet supported, No upper extremity supported Sitting balance-Leahy Scale: Good     Standing balance support: Bilateral upper extremity supported, During functional activity, Reliant on assistive device for balance Standing balance-Leahy Scale: Fair Standing balance comment: Pt was reliant on RW to maintain balance during stance and ambulation,  but remained steady throughout and no LOBs occured.                              Pertinent Vitals/Pain Pain Assessment Pain Assessment: 0-10    Home Living Family/patient expects to be discharged to:: Private residence Living Arrangements: Spouse/significant other Available Help at Discharge: Family;Available 24 hours/day Type of Home: House Home Access: Stairs to enter Entrance Stairs-Rails: None Entrance Stairs-Number of Steps: 2   Home Layout: One level Home Equipment: Cane - single point;Transport chair;Standard Walker      Prior Function Prior Level of Function : Needs assist  Cognitive Assist : ADLs (cognitive)   ADLs (Cognitive): Intermittent cues       Mobility Comments: Pt reported ambulating household distances without AD use. Pt reported 0 falls in the past 6 months. ADLs Comments: Pt's husband stated that he assists pt with ADLs due to pt's cognitive deficits     Extremity/Trunk Assessment   Upper Extremity Assessment Upper Extremity Assessment: Overall WFL for tasks assessed    Lower Extremity Assessment Lower Extremity Assessment: Generalized weakness       Communication   Communication Communication: No apparent difficulties    Cognition Arousal: Alert Behavior During Therapy: WFL for tasks assessed/performed   PT - Cognitive impairments: History of cognitive impairments, Memory, Attention, Sequencing, Awareness, Safety/Judgement                       PT - Cognition Comments: Pt requires minA with guiding RW during ambulation to avoid running into walls/objects Following commands: Impaired Following commands impaired: Follows one step commands inconsistently     Cueing Cueing Techniques: Verbal cues, Gestural cues, Tactile cues, Visual cues     General Comments      Exercises     Assessment/Plan    PT Assessment Patient needs continued PT services  PT Problem List Decreased strength;Decreased mobility;Decreased safety awareness;Decreased range of motion;Decreased coordination;Decreased knowledge of  precautions;Decreased activity tolerance;Decreased cognition;Decreased balance;Decreased knowledge of use of DME       PT Treatment Interventions DME instruction;Therapeutic exercise;Gait training;Balance training;Stair training;Functional mobility training;Therapeutic activities;Patient/family education    PT Goals (Current goals can be found in the Care Plan section)  Acute Rehab PT Goals Patient Stated Goal: to be able to get stronger and not lose balance with ADLs PT Goal Formulation: With patient Time For Goal Achievement: 04/23/24 Potential to Achieve Goals: Good    Frequency Min 3X/week     Co-evaluation               AM-PAC PT 6 Clicks Mobility  Outcome Measure Help needed turning from your back to your side while in a flat bed without using bedrails?: None Help needed moving from lying on your back to sitting on the side of a flat bed without using bedrails?: None Help needed moving to and from a bed to a chair (including a wheelchair)?: A Little Help needed standing up from a chair using your arms (e.g., wheelchair or bedside chair)?: A Little Help needed to walk in hospital room?: A Little Help needed climbing 3-5 steps with a railing? : A Lot 6 Click Score: 19    End of Session Equipment Utilized During Treatment: Gait belt Activity Tolerance: Patient tolerated treatment well Patient left: in chair;with chair alarm set;with call bell/phone within reach;with family/visitor present Nurse Communication: Mobility status PT Visit Diagnosis: Unsteadiness on feet (R26.81);Muscle weakness (generalized) (M62.81)  Time: 8889-8861 PT Time Calculation (min) (ACUTE ONLY): 28 min   Charges:                 Leontine Ingles, SPT 04/10/24, 2:01 PM

## 2024-04-10 NOTE — Progress Notes (Signed)
 Inpatient Rehab Admissions Coordinator:   Per therapy recs pt was screened for CIR by Reche Lowers, PT, DPT.  Note admitted for chest pain, workup unremarkable and cardiology felt not cardiac in source.  Pt with sudden onset inability to ambulate.  No H&P available yet, and only imaging available from yesterday with no acute intracranial abnormality.  At this juncture, she does not demonstrate the medical necessity to support a CIR admission.  Pending further workup we can rescreen.   Reche Lowers, PT, DPT Admissions Coordinator [phone] 04/10/24  12:45 PM

## 2024-04-10 NOTE — TOC Initial Note (Signed)
 Transition of Care [facility]) - Initial/Assessment Note    Patient Details  Name: [name] MRN: [id number] Date of Birth: Feb 23, 1951  [facility] CM/SW Contact:    Quintella Suzen Jansky, RN Phone Number: 04/10/2024, 3:02 PM  Clinical Narrative:                 Patient recommended for CIR, TOC will continue to monitor for discharge needs    Barriers to Discharge: Continued Medical Work up   Patient Goals and CMS Choice Patient states their goals for this hospitalization and ongoing recovery are:: get better          Expected Discharge Plan and Services       Living arrangements for the past 2 months: Single Family Home                   DME Agency: NA       HH Arranged: NA          Prior Living Arrangements/Services Living arrangements for the past 2 months: Single Family Home Lives with:: Spouse   Do you feel safe going back to the place where you live?: Yes      Need for Family Participation in Patient Care: No (Comment) Care giver support system in place?: Yes (comment) Current home services: DME (Cane - single point;Transport chair;Standard Environmental consultant) Criminal Activity/Legal Involvement Pertinent to Current Situation/Hospitalization: No - Comment as needed  Activities of Daily Living      Permission Sought/Granted                  Emotional Assessment Appearance:: Appears stated age     Orientation: : Oriented to Situation, Oriented to  Time, Oriented to Place, Oriented to Self Alcohol / Substance Use: Not Applicable Psych Involvement: No (comment)  Admission diagnosis:  Ataxia [R27.0] Difficulty walking [R26.2] Unable to ambulate [R26.2] Chest pain, unspecified type [R07.9] Chest pain [R07.9] Patient Active Problem List   Diagnosis Date Noted   Unable to ambulate 04/10/2024   Chest pain 04/10/2024   Reactive airway disease 04/19/2023   Chronic cough 04/19/2023   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   PAC (premature atrial  contraction) 02/10/2021   Osteoporosis 12/30/2020   Vitamin D  deficiency 12/30/2020   Frequent falls 12/30/2020   Abnormal EKG 04/13/2016   Chest pain, unspecified 04/13/2016   Family history of heart disease 04/13/2016   Heart palpitations 04/13/2016   Palpitations 02/17/2016   Diplopia 07/12/2015   Multiple sclerosis exacerbation (HCC) 07/11/2015   Small intestinal bacterial overgrowth 07/11/2015   Peripheral neuropathy (HCC) 07/11/2015   HLD (hyperlipidemia) 07/11/2015   Relapsing remitting multiple sclerosis (HCC) 04/28/2015   Multiple sclerosis (HCC) 02/21/2013   PCP:  Loreli Kins, MD Pharmacy:   [id number] [place] - [address] [place] [address] Phone: [phone] Fax: [phone]  [facility] [place] [place] - [address] [place] [address] Phone: [phone] Fax: [phone]  [facility] - [place], [place] - [address] [place] [address] Phone: [phone] Fax: [phone]     Social Drivers of Health (SDOH) Social History: SDOH Screenings   Food Insecurity: Unknown (04/10/2024)  Housing: Low Risk  (04/10/2024)  Transportation Needs: No Transportation Needs (04/10/2024)  Utilities: Not At Risk (04/10/2024)  Financial Resource Strain: Low Risk  (11/25/2021)   Received from [facility]  Social Connections: Unknown (02/20/2022)   [facility]  Stress: No Stress Concern Present (11/24/2021)   Received from [facility]  Tobacco Use: Low Risk  (04/09/2024)   SDOH Interventions:     Readmission Risk Interventions     No data to display
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
## 2024-04-10 NOTE — Plan of Care (Signed)
  Problem: Health Behavior/Discharge Planning: Goal: Ability to manage health-related needs will improve Outcome: Progressing   Problem: Clinical Measurements: Goal: Will remain free from infection Outcome: Progressing   Problem: Activity: Goal: Risk for activity intolerance will decrease Outcome: Progressing   Problem: Nutrition: Goal: Adequate nutrition will be maintained Outcome: Progressing   Problem: Coping: Goal: Level of anxiety will decrease Outcome: Progressing   Problem: Elimination: Goal: Will not experience complications related to urinary retention Outcome: Progressing   Problem: Safety: Goal: Ability to remain free from injury will improve Outcome: Progressing

## 2024-04-10 NOTE — [place] FL2 (Signed)
 [place]  MEDICAID FL2 LEVEL OF CARE FORM     IDENTIFICATION  Patient Name: [name] Birthdate: [birth year] Sex: female Admission Date (Current Location): 04/09/2024  [facility] and [place] Number:  Chiropodist and Address:  [facility], [address], [place], [place] [address]      Provider Number: [id number]  Attending Physician Name and Address:  Luz Skelton, MD  Relative Name and Phone Number:  Spouse: Amenah Tucci [phone]    Current Level of Care: Hospital Recommended Level of Care: Skilled Nursing Facility Prior Approval Number:    Date Approved/Denied:   PASRR Number: [id number]  Discharge Plan: SNF    Current Diagnoses: Patient Active Problem List   Diagnosis Date Noted   Unable to ambulate 04/10/2024   Chest pain 04/10/2024   Reactive airway disease 04/19/2023   Chronic cough 04/19/2023   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   PAC (premature atrial contraction) 02/10/2021   Osteoporosis 12/30/2020   Vitamin D  deficiency 12/30/2020   Frequent falls 12/30/2020   Abnormal EKG 04/13/2016   Chest pain, unspecified 04/13/2016   Family history of heart disease 04/13/2016   Heart palpitations 04/13/2016   Palpitations 02/17/2016   Diplopia 07/12/2015   Multiple sclerosis exacerbation (HCC) 07/11/2015   Small intestinal bacterial overgrowth 07/11/2015   Peripheral neuropathy (HCC) 07/11/2015   HLD (hyperlipidemia) 07/11/2015   Relapsing remitting multiple sclerosis (HCC) 04/28/2015   Multiple sclerosis (HCC) 02/21/2013    Orientation RESPIRATION BLADDER Height & Weight     Self, Time, Situation, Place  Normal Continent Weight: 58.1 kg Height:  5' 5 (165.1 cm)  BEHAVIORAL SYMPTOMS/MOOD NEUROLOGICAL BOWEL NUTRITION STATUS      Continent Diet (Regular diet, thin liquid)  AMBULATORY STATUS COMMUNICATION OF NEEDS Skin   Extensive Assist Verbally Normal                       Personal Care Assistance Level of  Assistance  Bathing, Feeding, Dressing Bathing Assistance: Limited assistance Feeding assistance: Independent Dressing Assistance: Limited assistance     Functional Limitations Info             SPECIAL CARE FACTORS FREQUENCY  PT (By licensed PT), OT (By licensed OT)     PT Frequency: 5 times per week OT Frequency: 5 time per week            Contractures Contractures Info: Not present    Additional Factors Info  Code Status, Allergies Code Status Info: Full code Allergies Info: Celebrex (celecoxib), Red Yeast Rice (cholestin),  Amoxicillin, Citalopram, Codeine, Monascus Purpureus Went Yeast,  Morphine, Reclast  (zoledronic  Acid), Sulfa Antibiotics, Tramadol, Zocor (simvastatin), Achromycin (tetracycline),    Carafate (sucralfate), Codeine Sulfate, Colesevelam, Welchol           Current Medications (04/10/2024):  This is the current hospital active medication list Current Facility-Administered Medications  Medication Dose Route Frequency Provider Last Rate Last Admin   [START ON 04/11/2024]  stroke: early stages of recovery book   Does not apply Once Mansy, Jan A, MD       acetaminophen  (TYLENOL ) tablet 650 mg  650 mg Oral Q4H PRN Mansy, Jan A, MD       Or   acetaminophen  (TYLENOL ) 160 MG/5ML solution 650 mg  650 mg Per Tube Q4H PRN Mansy, Jan A, MD       Or   acetaminophen  (TYLENOL ) suppository 650 mg  650 mg Rectal Q4H PRN Mansy, Madison LABOR, MD  albuterol  (PROVENTIL ) (2.5 MG/3ML) 0.083% nebulizer solution 3 mL  3 mL Inhalation Q6H PRN Sira, Zackery, MD       ALPRAZolam (XANAX) tablet 0.25 mg  0.25 mg Oral BID PRN Mansy, Jan A, MD       arformoterol (BROVANA) nebulizer solution 15 mcg  15 mcg Nebulization BID Sira, Zackery, MD       aspirin  EC tablet 81 mg  81 mg Oral Daily Mansy, Jan A, MD   81 mg at 04/10/24 1052   budesonide  (PULMICORT ) nebulizer solution 0.25 mg  0.25 mg Nebulization BID Sira, Zackery, MD   0.25 mg at 04/10/24 1005   cholecalciferol (VITAMIN D3) 25 MCG  (1000 UNIT) tablet 1,000 Units  1,000 Units Oral Daily Sira, Zackery, MD   1,000 Units at 04/10/24 1051   DULoxetine (CYMBALTA) DR capsule 20 mg  20 mg Oral Daily Sira, Zackery, MD   20 mg at 04/10/24 1051   enoxaparin  (LOVENOX ) injection 40 mg  40 mg Subcutaneous Q24H Mansy, Jan A, MD   40 mg at 04/10/24 0744   ezetimibe  (ZETIA ) tablet 10 mg  10 mg Oral Daily Sira, Zackery, MD   10 mg at 04/10/24 1052   flecainide  (TAMBOCOR ) tablet 50 mg  50 mg Oral Q12H Sira, Zackery, MD   50 mg at 04/10/24 1150   memantine (NAMENDA) tablet 5 mg  5 mg Oral BID Sira, Zackery, MD   5 mg at 04/10/24 1051   ondansetron  (ZOFRAN ) injection 4 mg  4 mg Intravenous Q6H PRN Mansy, Jan A, MD       pantoprazole  (PROTONIX ) EC tablet 40 mg  40 mg Oral Daily Sira, Zackery, MD   40 mg at 04/10/24 0849   pravastatin (PRAVACHOL) tablet 20 mg  20 mg Oral q1800 Sira, Zackery, MD       predniSONE  (DELTASONE ) tablet 20 mg  20 mg Oral Q breakfast Sira, Zackery, MD   20 mg at 04/10/24 0849   pregabalin (LYRICA) capsule 75 mg  75 mg Oral TID Sira, Zackery, MD   75 mg at 04/10/24 1051   rivastigmine (EXELON) capsule 6 mg  6 mg Oral BID Sira, Zackery, MD   6 mg at 04/10/24 1150   senna-docusate (Senokot-S) tablet 1 tablet  1 tablet Oral QHS PRN Mansy, Jan A, MD       traZODone (DESYREL) tablet 25 mg  25 mg Oral QHS PRN Mansy, Madison LABOR, MD       Facility-Administered Medications Ordered in Other Encounters  Medication Dose Route Frequency Provider Last Rate Last Admin   albuterol  (PROVENTIL ) (2.5 MG/3ML) 0.083% nebulizer solution 2.5 mg  2.5 mg Nebulization Once Cobb, Katherine V, NP         Discharge Medications: Please see discharge summary for a list of discharge medications.  Relevant Imaging Results:  Relevant Lab Results:   Additional Information SSN: [id number]  Quintella Suzen Jansky, RN
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
## 2024-04-10 NOTE — ED Notes (Signed)
 Pt is a full assist when walking now. Unable to stand on her own. Stated that she just can't keep her balance. Denies dizziness

## 2024-04-10 NOTE — Evaluation (Addendum)
 Occupational Therapy Evaluation Patient Details Name: [name] MRN: [id number] DOB: [birth year] Today's Date: 04/10/2024   History of Present Illness   Pt is a 73 y.o. female with PMH hyperlipidemia, GERD, multiple sclerosis, IBS, asthma, A-fib not on anticoagulation who presented to the ED with chest discomfort, dizziness, and difficulty walking. MD assessment includes HTN and dementia.     Clinical Impressions Pt was seen for OT evaluation this date. PTA, pt resides in a one level home with her husband with 2 STE with no rails. Pt is normally able to ambulate in the home without AD, but has assist from her husband for ADLs d/t cognition. He reports she normally showers on her own, but he assists with dressing to avoid putting items on inside out.   Pt presents to acute OT demonstrating impaired ADL performance and functional mobility 2/2 increased cognitive deficits, mild balance deficits and weakness. She is alert and oriented to person and place, unable to state time-per husband this is normal. She knows she recently went to the beach with her family and has become weaker. She continues to report chest discomfort, but states it is improved from admission. She is able to adjust bil socks from seated position in recliner via lateral leans with supervision. Pt currently requires CGA for STS from recliner to RW with mod cueing for hand placement. Ambulated ~16 ft in the room using RW with Min A for RW management to avoid obstacles d/t poor vision. Min/mod A for standing oral care as pt needs assist locating items and cueing for initiation. Bil knees buckled slightly, but pt able to correct with UE support on sink counter or RW. Pt would benefit from skilled OT services to address noted impairments and functional limitations to maximize safety and independence while minimizing falls risk and caregiver burden. Do anticipate the need for follow up OT services upon acute hospital DC-recommend IPR to  maximize return to PLOF.      If plan is discharge home, recommend the following:   A little help with walking and/or transfers;A little help with bathing/dressing/bathroom;Assistance with cooking/housework;Help with stairs or ramp for entrance     Functional Status Assessment   Patient has had a recent decline in their functional status and demonstrates the ability to make significant improvements in function in a reasonable and predictable amount of time.     Equipment Recommendations   BSC/3in1     Recommendations for Other Services   Rehab consult     Precautions/Restrictions   Precautions Precautions: Fall Recall of Precautions/Restrictions: Impaired Restrictions Weight Bearing Restrictions Per Provider Order: No     Mobility Bed Mobility               General bed mobility comments: NT up in chair on entry    Transfers Overall transfer level: Needs assistance Equipment used: Rolling walker (2 wheels) Transfers: Sit to/from Stand Sit to Stand: Contact guard assist, Min assist           General transfer comment: CGA for STS from recliner d/t need for cues for hand placement, use of RW, RW management to avoid obstacles ~16 ft      Balance Overall balance assessment: Needs assistance Sitting-balance support: Feet supported, No upper extremity supported Sitting balance-Leahy Scale: Good Sitting balance - Comments: able to reach down to adjust socks without LOB   Standing balance support: Bilateral upper extremity supported, During functional activity, Reliant on assistive device for balance Standing balance-Leahy Scale: Fair Standing balance comment:  Min/CGA for RW management and occasional knee buckling d/t fatigue and no UE support                           ADL either performed or assessed with clinical judgement   ADL Overall ADL's : Needs assistance/impaired     Grooming: Oral care;Wash/dry face;Wash/dry  hands;Standing;Cueing for sequencing;Cueing for safety;Minimal assistance;Moderate assistance Grooming Details (indicate cue type and reason): min/mod a in regard to cueing for visual deficits and cognition, e,g. location of items, initation of tasks, able to physically perform task                             Functional mobility during ADLs: Minimal assistance;Rolling walker (2 wheels);Moderate assistance       Vision Ability to See in Adequate Light: 3 Highly impaired Additional Comments: increased cues to locate objects in room, has visual deficits at baseline but per husband and pt this is worse     Perception         Praxis         Pertinent Vitals/Pain Pain Assessment Pain Assessment: No/denies pain     Extremity/Trunk Assessment Upper Extremity Assessment Upper Extremity Assessment: Overall WFL for tasks assessed   Lower Extremity Assessment Lower Extremity Assessment: Generalized weakness       Communication Communication Communication: No apparent difficulties   Cognition Arousal: Alert Behavior During Therapy: WFL for tasks assessed/performed                                 Following commands: Impaired Following commands impaired: Follows one step commands inconsistently     Cueing  General Comments   Cueing Techniques: Verbal cues;Gestural cues;Tactile cues;Visual cues      Exercises Other Exercises Other Exercises: Edu on role of OT in acute setting.   Shoulder Instructions      Home Living Family/patient expects to be discharged to:: Private residence Living Arrangements: Spouse/significant other Available Help at Discharge: Family;Available 24 hours/day Type of Home: House Home Access: Stairs to enter [employer] of Steps: 2 Entrance Stairs-Rails: None Home Layout: One level     Bathroom Shower/Tub: Tub/shower unit         Home Equipment: [place] - single point;Transport chair;Standard Environmental consultant       Lives With: Spouse    Prior Functioning/Environment Prior Level of Function : Needs assist  Cognitive Assist : ADLs (cognitive)   ADLs (Cognitive): Intermittent cues       Mobility Comments: Pt reported ambulating household distances without AD use. Pt reported 0 falls in the past 6 months. ADLs Comments: Pt's husband stated that he assists pt with ADLs due to pt's cognitive deficits    OT Problem List: Decreased strength;Impaired balance (sitting and/or standing);Decreased safety awareness;Decreased cognition   OT Treatment/Interventions: Self-care/ADL training;Therapeutic exercise;Therapeutic activities;Energy conservation;Neuromuscular education;Patient/family education;Balance training;DME and/or AE instruction      OT Goals(Current goals can be found in the care plan section)   Acute Rehab OT Goals Patient Stated Goal: get better OT Goal Formulation: With patient/family Time For Goal Achievement: 04/24/24 Potential to Achieve Goals: Good ADL Goals Pt Will Perform Lower Body Bathing: sitting/lateral leans;sit to/from stand;with contact guard assist Pt Will Perform Lower Body Dressing: with supervision;sit to/from stand;sitting/lateral leans Pt Will Transfer to Toilet: with supervision;ambulating;regular height toilet   OT Frequency:  Min 2X/week  Co-evaluation              AM-PAC OT 6 Clicks Daily Activity     Outcome Measure Help from another person eating meals?: None Help from another person taking care of personal grooming?: A Little Help from another person toileting, which includes using toliet, bedpan, or urinal?: A Little Help from another person bathing (including washing, rinsing, drying)?: A Lot Help from another person to put on and taking off regular upper body clothing?: A Little Help from another person to put on and taking off regular lower body clothing?: A Lot 6 Click Score: 17   End of Session Equipment Utilized During Treatment: Gait  belt;Rolling walker (2 wheels) Nurse Communication: Mobility status  Activity Tolerance: Patient tolerated treatment well Patient left: in chair;with call bell/phone within reach;with chair alarm set;with family/visitor present  OT Visit Diagnosis: Other abnormalities of gait and mobility (R26.89);Muscle weakness (generalized) (M62.81)                Time: 9066-8997 OT Time Calculation (min): 29 min Charges:  OT General Charges $OT Visit: 1 Visit OT Evaluation $OT Eval Moderate Complexity: 1 Mod OT Treatments $Self Care/Home Management : 8-22 mins Ventura Hollenbeck, OTR/L  04/10/24, 12:30 PM  Derisha Funderburke E May Ozment 04/10/2024, 12:30 PM
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
## 2024-04-11 DIAGNOSIS — R262 Difficulty in walking, not elsewhere classified: Secondary | ICD-10-CM | POA: Diagnosis not present

## 2024-04-11 LAB — CBC
HCT: 35.5 % — ABNORMAL LOW (ref 36.0–46.0)
Hemoglobin: 11.6 g/dL — ABNORMAL LOW (ref 12.0–15.0)
MCH: 31 pg (ref 26.0–34.0)
MCHC: 32.7 g/dL (ref 30.0–36.0)
MCV: 94.9 fL (ref 80.0–100.0)
Platelets: 172 10*3/uL (ref 150–400)
RBC: 3.74 MIL/uL — ABNORMAL LOW (ref 3.87–5.11)
RDW: 12.5 % (ref 11.5–15.5)
WBC: 5.9 10*3/uL (ref 4.0–10.5)
nRBC: 0 % (ref 0.0–0.2)

## 2024-04-11 LAB — COMPREHENSIVE METABOLIC PANEL WITH GFR
ALT: 14 U/L (ref 0–44)
AST: 19 U/L (ref 15–41)
Albumin: 3.2 g/dL — ABNORMAL LOW (ref 3.5–5.0)
Alkaline Phosphatase: 35 U/L — ABNORMAL LOW (ref 38–126)
Anion gap: 9 (ref 5–15)
BUN: 18 mg/dL (ref 8–23)
CO2: 25 mmol/L (ref 22–32)
Calcium: 8.8 mg/dL — ABNORMAL LOW (ref 8.9–10.3)
Chloride: 107 mmol/L (ref 98–111)
Creatinine, Ser: 0.55 mg/dL (ref 0.44–1.00)
GFR, Estimated: >60 mL/min (ref >60)
Glucose, Bld: 101 mg/dL — ABNORMAL HIGH (ref 70–99)
Potassium: 3.8 mmol/L (ref 3.5–5.1)
Sodium: 141 mmol/L (ref 135–145)
Total Bilirubin: 0.9 mg/dL (ref 0.0–1.2)
Total Protein: 6.2 g/dL — ABNORMAL LOW (ref 6.5–8.1)

## 2024-04-11 LAB — MAGNESIUM: Magnesium: 2.5 mg/dL — ABNORMAL HIGH (ref 1.7–2.4)

## 2024-04-11 MED ORDER — VITAMIN D 50 MCG (2000 UT) PO TABS: 2000.0000 [IU] | ORAL_TABLET | Freq: Every day | ORAL | 0 refills | Status: AC

## 2024-04-11 NOTE — Discharge Instructions (Signed)
 [facility].  [address] , [place], [place], [address]. [phone] They will call you to arrange when they can come to see you

## 2024-04-11 NOTE — TOC Progression Note (Signed)
 Transition of Care [facility]) - Progression Note    Patient Details  Name: [name] MRN: [id number] Date of Birth: 14-Mar-1951  [facility] CM/SW Contact  Quintella Suzen Jansky, RN Phone Number: 04/11/2024, 12:51 PM  Clinical Narrative:     Spoke with patient and spouse, they are agreeable to home health services. They would like to use [facility]. Sent referral to [place] , awaiting response.     Barriers to Discharge: Continued Medical Work up  Expected Discharge Plan and Services       Living arrangements for the past 2 months: Single Family Home Expected Discharge Date: 04/11/24                 DME Agency: NA       HH Arranged: NA           Social Determinants of Health (SDOH) Interventions SDOH Screenings   Food Insecurity: Unknown (04/10/2024)  Housing: Low Risk  (04/10/2024)  Transportation Needs: No Transportation Needs (04/10/2024)  Utilities: Not At Risk (04/10/2024)  Financial Resource Strain: Low Risk  (11/25/2021)   Received from [facility]  Social Connections: Unknown (02/20/2022)   Received from [facility]  Stress: No Stress Concern Present (11/24/2021)   Received from [facility]  Tobacco Use: Low Risk  (04/09/2024)    Readmission Risk Interventions     No data to display

## 2024-04-11 NOTE — Discharge Summary (Signed)
 Physician Discharge Summary   Patient: [name] MRN: [id number] DOB: 1950-12-19  Admit date:     04/09/2024  Discharge date: 04/11/24  Discharge Physician: Drue ONEIDA Potter   PCP: Loreli Kins, MD   Recommendations at discharge:   Follow-up with PCP and cardiology  Discharge Diagnoses: Dementia Hypertension Musculoskeletal chest pain Multiple sclerosis  Hospital Course:  [name] is a 73 y.o. female PMHx of MS who presents to the hospital with chest pain. Workup in the ER revealed troponin's of 4 (checked twice).  Patient was subsequently seen by cardiology and chest pain was thought to be due to musculoskeletal in origin and no further cardiac workup recommended.  Echo did not show any wall motion abnormalities.  Patient able to work with physical therapy and has been cleared for discharge today and will follow-up as an outpatient.   Consultants: Cardiology Procedures performed: None Disposition: Home Diet recommendation:  Cardiac diet DISCHARGE MEDICATION: Allergies as of 04/11/2024       Reactions   Statins Anaphylaxis   Celebrex [celecoxib] Itching, Swelling   Throat swelling   Red Yeast Rice [cholestin] Itching, Swelling   Throat swelling   Amoxicillin Diarrhea   Citalopram    Memory issues   Codeine Nausea And Vomiting   [place] Purpureus Went Yeast Other (See Comments)   Morphine    crying   Reclast  [zoledronic  Acid] Other (See Comments)   Chest tightness   Sulfa Antibiotics Itching   Tramadol    LACK OF EFFECT   Zocor [simvastatin] Itching, Swelling   Throat swelling   Achromycin [tetracycline] Hives   Carafate [sucralfate] Rash   Codeine Sulfate Nausea And Vomiting   Colesevelam Nausea And Vomiting   Welchol [colesevelam Hcl] Swelling        Medication List     STOP taking these medications    DHA PO   esomeprazole  40 MG capsule Commonly known as: NexIUM        TAKE these medications    acetaminophen  500 MG tablet Commonly  known as: TYLENOL  Take 1,000 mg by mouth every 8 (eight) hours as needed for mild pain (pain score 1-3).   albuterol  108 (90 Base) MCG/ACT inhaler Commonly known as: VENTOLIN  HFA Inhale 2 puffs into the lungs every 6 (six) hours as needed for wheezing or shortness of breath.   benzonatate  200 MG capsule Commonly known as: TESSALON  Take 1 capsule (200 mg total) by mouth 3 (three) times daily as needed for cough.   budesonide -formoterol  80-4.5 MCG/ACT inhaler Commonly known as: Symbicort  Inhale 2 puffs into the lungs in the morning and at bedtime.   COQ10 PO Take by mouth daily.   DULoxetine 20 MG capsule Commonly known as: CYMBALTA Take 20 mg by mouth daily.   ezetimibe  10 MG tablet Commonly known as: ZETIA  Take 10 mg by mouth daily.   Fish Oil 1200 MG Caps Take 2 capsules by mouth 2 (two) times daily.   flecainide  50 MG tablet Commonly known as: TAMBOCOR  TAKE 1 TABLET BY MOUTH TWICE A DAY   Insulin  Pen Needle 31G X 5 MM Misc Use to inject Forteo  once daily.   levalbuterol  45 MCG/ACT inhaler Commonly known as: XOPENEX  HFA Inhale 2 puffs into the lungs every 4 (four) hours as needed for wheezing.   meloxicam  15 MG tablet Commonly known as: MOBIC  Take 7.5 mg by mouth 2 (two) times daily.   memantine 10 MG tablet Commonly known as: NAMENDA Take 10 mg by mouth 2 (two) times  daily.   pantoprazole  40 MG tablet Commonly known as: PROTONIX  TAKE 1 TABLET BY MOUTH EVERY DAY   pravastatin 20 MG tablet Commonly known as: PRAVACHOL Take 20 mg by mouth daily.   predniSONE  20 MG tablet Commonly known as: DELTASONE  Take 1 tablet (20 mg total) by mouth daily with breakfast.   pregabalin 75 MG capsule Commonly known as: LYRICA Take 75 mg by mouth 3 (three) times daily.   propranolol  ER 60 MG 24 hr capsule Commonly known as: INDERAL  LA TAKE 1 CAPSULE BY MOUTH EVERY DAY   rivastigmine 6 MG capsule Commonly known as: EXELON Take 6 mg by mouth 2 (two) times daily.    vitamin A 25000 UNIT capsule Take 25,000 Units by mouth daily.   Vitamin D  50 MCG (2000 UT) tablet Take 1 tablet (2,000 Units total) by mouth daily. What changed:  medication strength how much to take        Follow-up Information     [facility] up.   Specialty: [facility] Contact information: [address] [place] [address] [phone]                Discharge Exam: [name]   04/09/24 1804  Weight: 58.1 kg   Head: Normocephalic.  Cardiovascular: S1-S2 present no murmur head    Rate and Rhythm:  Pulmonary:     Effort: Pulmonary effort is normal.  Abdominal:     Palpations: Abdomen is soft.  Musculoskeletal:        General: Normal range of motion.  Skin:    General: Skin is warm.  Neurological:     Mental Status: She is alert and oriented to person, place, and time.  Psychiatric:        Mood and Affect: Mood normal.   Condition at discharge: good  The results of significant diagnostics from this hospitalization (including imaging, microbiology, ancillary and laboratory) are listed below for reference.   Imaging Studies: ECHOCARDIOGRAM COMPLETE Result Date: 04/10/2024    ECHOCARDIOGRAM REPORT   Patient Name:   [name] Date of Exam: 04/10/2024 Medical Rec #:  [id number]     Height:       65.0 in Accession #:    [id number]    Weight:       128.0 lb Date of Birth:  [birth year]    BSA:          1.636 m Patient Age:    72 years      BP:           95/63 mmHg Patient Gender: F             HR:           54 bpm. Exam Location:  [facility] Procedure: 2D Echo, Color Doppler, Cardiac Doppler and Strain Analysis (Both            Spectral and Color Flow Doppler were utilized during procedure). Indications:     Chest Pain R07.9  History:         Patient has no prior history of Echocardiogram examinations.  Sonographer:     Rosina Dunk Referring Phys:  [id number] ATLEE ABERNETHY Diagnosing Phys: Redell Cave MD  Sonographer  Comments: Global longitudinal strain was attempted. IMPRESSIONS  1. Left ventricular ejection fraction, by estimation, is 60 to 65%. The left ventricle has normal function. The left ventricle has no regional wall motion abnormalities. There is mild left ventricular hypertrophy. Left ventricular diastolic parameters  were normal. The average left ventricular global longitudinal strain is -21.7 %. The global longitudinal strain is normal.  2. Right ventricular systolic function is normal. The right ventricular size is normal.  3. The mitral valve is normal in structure. No evidence of mitral valve regurgitation.  4. The aortic valve is tricuspid. Aortic valve regurgitation is not visualized.  5. The inferior vena cava is normal in size with greater than 50% respiratory variability, suggesting right atrial pressure of 3 mmHg. FINDINGS  Left Ventricle: Left ventricular ejection fraction, by estimation, is 60 to 65%. The left ventricle has normal function. The left ventricle has no regional wall motion abnormalities. The average left ventricular global longitudinal strain is -21.7 %. Strain was performed and the global longitudinal strain is normal. The global longitudinal strain is normal despite suboptimal segment tracking. The left ventricular internal cavity size was normal in size. There is mild left ventricular hypertrophy. Left ventricular diastolic parameters were normal. Right Ventricle: The right ventricular size is normal. No increase in right ventricular wall thickness. Right ventricular systolic function is normal. Left Atrium: Left atrial size was normal in size. Right Atrium: Right atrial size was normal in size. Pericardium: There is no evidence of pericardial effusion. Mitral Valve: The mitral valve is normal in structure. No evidence of mitral valve regurgitation. MV peak gradient, 2.9 mmHg. The mean mitral valve gradient is 1.0 mmHg. Tricuspid Valve: The tricuspid valve is normal in structure. Tricuspid  valve regurgitation is not demonstrated. Aortic Valve: The aortic valve is tricuspid. Aortic valve regurgitation is not visualized. Aortic valve mean gradient measures 3.0 mmHg. Aortic valve peak gradient measures 6.1 mmHg. Aortic valve area, by VTI measures 2.15 cm. Pulmonic Valve: The pulmonic valve was normal in structure. Pulmonic valve regurgitation is not visualized. Aorta: The aortic root and ascending aorta are structurally normal, with no evidence of dilitation. Venous: The inferior vena cava is normal in size with greater than 50% respiratory variability, suggesting right atrial pressure of 3 mmHg. IAS/Shunts: No atrial level shunt detected by color flow Doppler.  LEFT VENTRICLE PLAX 2D LVIDd:         4.00 cm     Diastology LVIDs:         2.60 cm     LV e' medial:    7.70 cm/s LV PW:         1.20 cm     LV E/e' medial:  9.0 LV IVS:        1.10 cm     LV e' lateral:   9.49 cm/s LVOT diam:     1.90 cm     LV E/e' lateral: 7.3 LV SV:         61 LV SV Index:   37          2D Longitudinal Strain LVOT Area:     2.84 cm    2D Strain GLS Avg:     -21.7 %  LV Volumes (MOD) LV vol d, MOD A2C: 48.8 ml LV vol d, MOD A4C: 53.9 ml LV vol s, MOD A2C: 19.7 ml LV vol s, MOD A4C: 25.1 ml LV SV MOD A2C:     29.1 ml LV SV MOD A4C:     53.9 ml LV SV MOD BP:      29.7 ml RIGHT VENTRICLE            IVC RV Basal diam:  3.80 cm    IVC diam: 0.50 cm RV Mid diam:  2.20 cm RV S prime:     9.46 cm/s TAPSE (M-mode): 2.0 cm LEFT ATRIUM             Index        RIGHT ATRIUM           Index LA diam:        2.10 cm 1.28 cm/m   RA Area:     14.50 cm LA Vol (A2C):   49.6 ml 30.31 ml/m  RA Volume:   36.40 ml  22.24 ml/m LA Vol (A4C):   16.8 ml 10.27 ml/m LA Biplane Vol: 29.1 ml 17.78 ml/m  AORTIC VALVE                    PULMONIC VALVE AV Area (Vmax):    2.19 cm     PV Vmax:        0.48 m/s AV Area (Vmean):   2.10 cm     PV Vmean:       33.700 cm/s AV Area (VTI):     2.15 cm     PV VTI:         0.124 m AV Vmax:           123.00  cm/s  PV Peak grad:   0.9 mmHg AV Vmean:          82.800 cm/s  PV Mean grad:   1.0 mmHg AV VTI:            0.284 m      RVOT Peak grad: 0 mmHg AV Peak Grad:      6.1 mmHg AV Mean Grad:      3.0 mmHg LVOT Vmax:         95.20 cm/s LVOT Vmean:        61.400 cm/s LVOT VTI:          0.215 m LVOT/AV VTI ratio: 0.76  AORTA Ao Root diam: 2.90 cm Ao Asc diam:  3.20 cm MITRAL VALVE MV Area (PHT): 3.21 cm    SHUNTS MV Area VTI:   2.08 cm    Systemic VTI:  0.22 m MV Peak grad:  2.9 mmHg    Systemic Diam: 1.90 cm MV Mean grad:  1.0 mmHg    Pulmonic VTI:  0.095 m MV Vmax:       0.85 m/s MV Vmean:      46.4 cm/s MV Decel Time: 236 msec MV E velocity: 69.10 cm/s MV A velocity: 63.60 cm/s MV E/A ratio:  1.09 Redell Cave MD Electronically signed by Redell Cave MD Signature Date/Time: 04/10/2024/11:43:24 AM    Final    MR Brain W and Wo Contrast Result Date: 04/10/2024 CLINICAL DATA:  hx MS, new ataxia, eval for e/o MS flare, other pathology EXAM: MRI HEAD WITHOUT AND WITH CONTRAST TECHNIQUE: Multiplanar, multiecho pulse sequences of the brain and surrounding structures were obtained without and with intravenous contrast. CONTRAST:  6mL GADAVIST  GADOBUTROL  1 MMOL/ML IV SOLN COMPARISON:  MRI head January 21, 2022. FINDINGS: Brain: No acute infarction, hemorrhage, hydrocephalus, extra-axial collection or mass lesion. Scattered T2/FLAIR hyperintensities within the white matter, mild for age. Cerebral atrophy. No abnormal enhancement. Vascular: Major arterial flow voids are maintained skull base. Skull and upper cervical spine: Normal marrow signal. Sinuses/Orbits: Clear sinuses.  No acute orbital findings. Other: No mastoid effusions. IMPRESSION: 1. No evidence of acute intracranial abnormality. 2. Scattered T2/FLAIR hyperintensities within the white matter, mild for age and nonspecific. Differential considerations include chronic  microvascular ischemic disease and chronic demyelination. No new lesions. No enhancing lesions  to suggest active demyelination. Electronically Signed   By: Gilmore GORMAN Molt M.D.   On: 04/10/2024 00:13   DG Chest 2 View Result Date: 04/09/2024 CLINICAL DATA:  Centralized chest pain and cough. EXAM: CHEST - 2 VIEW COMPARISON:  Feb 07, 2022 FINDINGS: The heart size and mediastinal contours are within normal limits. Ill-defined bilateral nipple shadows are suspected. No acute infiltrate, pleural effusion or pneumothorax is identified. The visualized skeletal structures are unremarkable. IMPRESSION: No active cardiopulmonary disease. Electronically Signed   By: Suzen Dials M.D.   On: 04/09/2024 18:56    Microbiology: Results for orders placed or performed in visit on 04/27/17  CULTURE, URINE COMPREHENSIVE     Status: None   Collection Time: 04/27/17  3:10 PM   Specimen: Urine   URINE  Result Value Ref Range Status   Urine Culture, Comprehensive Final report  Final   Organism ID, Bacteria Comment  Final    Comment: No growth in 36 - 48 hours.    Labs: CBC: Recent Labs  Lab 04/09/24 1807 04/11/24 0337  WBC 7.5 5.9  HGB 12.4 11.6*  HCT 38.0 35.5*  MCV 95.7 94.9  PLT 173 172   Basic Metabolic Panel: Recent Labs  Lab 04/09/24 1807 04/11/24 0337  NA 143 141  K 3.6 3.8  CL 109 107  CO2 27 25  GLUCOSE 92 101*  BUN 23 18  CREATININE 0.78 0.55  CALCIUM 9.1 8.8*  MG  --  2.5*   Liver Function Tests: Recent Labs  Lab 04/09/24 1807 04/11/24 0337  AST 22 19  ALT 17 14  ALKPHOS 37* 35*  BILITOT 0.8 0.9  PROT 6.0* 6.2*  ALBUMIN 3.3* 3.2*   CBG: No results for input(s): GLUCAP in the last 168 hours.  Discharge time spent:  37 minutes.  Signed: Drue ONEIDA Potter, MD [facility] 04/11/2024

## 2024-04-11 NOTE — Plan of Care (Signed)
   Problem: Education: Goal: Knowledge of General Education information will improve Description Including pain rating scale, medication(s)/side effects and non-pharmacologic comfort measures Outcome: Progressing

## 2024-04-11 NOTE — TOC Transition Note (Signed)
 Transition of Care [facility]) - Discharge Note   Patient Details  Name: [name] MRN: [id number] Date of Birth: 1951/03/07  Transition of [facility]) CM/SW Contact:  Quintella Suzen Jansky, RN Phone Number: 04/11/2024, 1:42 PM   Clinical Narrative:     Patient to discharge today, home with home health. [facility] accepted patient, information added to AVS.    Barriers to Discharge: Continued Medical Work up   Patient Goals and CMS Choice Patient states their goals for this hospitalization and ongoing recovery are:: get better          Discharge Placement                  Name of family member notified: Bill Patient and family notified of of transfer: 04/11/24  Discharge Plan and Services Additional resources added to the After Visit Summary for                    DME Agency: NA       HH Arranged: OT, PT HH Agency: [facility] Date [facility] Agency Contacted: 04/11/24 Time HH Agency Contacted: 1342 Representative spoke with at [facility] Agency: [place]   Social Drivers of Health (SDOH) Interventions SDOH Screenings   Food Insecurity: Unknown (04/10/2024)  Housing: Low Risk  (04/10/2024)  Transportation Needs: No Transportation Needs (04/10/2024)  Utilities: Not At Risk (04/10/2024)  Financial Resource Strain: Low Risk  (11/25/2021)   Received from [facility]  Social Connections: Unknown (02/20/2022)   Received from [facility]  Stress: No Stress Concern Present (11/24/2021)   Received from [facility]  Tobacco Use: Low Risk  (04/09/2024)     Readmission Risk Interventions     No data to display

## 2024-04-11 NOTE — Care Management Obs Status (Signed)
 MEDICARE OBSERVATION STATUS NOTIFICATION   Patient Details  Name: [name] MRN: [id number] Date of Birth: 06-27-1951   Medicare Observation Status Notification Given:  Chaney BRANDY CHRISTIANE LELON, CMA 04/11/2024, 10:53 AM

## 2024-04-11 NOTE — Progress Notes (Signed)
 Speech Language Pathology Treatment: Cognitive-Linguistic  Patient Details Name: [name] MRN: [id number] DOB: 01-21-1951 Today's Date: 04/11/2024 Time: 9169-9151 SLP Time Calculation (min) (ACUTE ONLY): 18 min  Assessment / Plan / Recommendation Clinical Impression  Pt seen for skilled ST tx targeting functional auditory comprehension and verbal expression. Pt continues to present with s/sx mild mixed receptive/expressive language deficits including impaired complex auditory comprehension and anomia. Semantic paraphasias noted. Markedly improved wordfinding from previous date however.mproved processing speed/response time noted. Pt continues with reduced error awareness for complex auditory comprehension tasks, but improved error awareness for wordfinding difficulty during both structured and unstructured tasks. Pt endorsed feeling perkier and that her communication was doing better. Pt noted she has some wordfinding difficulty at baseline with MS diagnosis.  Pt and husband educated re: basic strategies to improve functional communication, pt's progress to date, and SLP recommendations. Both verbalized understanding/agreement.  Recommend continued ST services acute and post-acute for above mentioned deficits. Post-acute ST in alignment with PT/OT recommendations. ST to continue to follow.    HPI HPI: Pt is a 73 y.o. female presents for evaluation of chest discomfort, mild dizziness, BLE weakness and difficulty walking. 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 MRI 1. No evidence of acute intracranial abnormality.  2. Scattered T2/FLAIR hyperintensities within the white matter, mild  for age and nonspecific. Differential considerations include chronic  microvascular ischemic disease and chronic demyelination. No new  lesions. No enhancing lesions to suggest active demyelination.      SLP Plan   Continue with current plan of care          Recommendations   POST-ACUTE SLP SERVICES IN ALIGNMENT WITH OT/PT RECOMMENDATIONS            Frequent or constant Supervision/Assistance Cognitive communication deficit (R41.841);Aphasia (R47.01);Apraxia (R48.2)     Continue with current plan of care    Delon Bangs, M.S., CCC-SLP Speech-Language Pathologist [facility] [phone] [place])  Delon CHRISTELLA Bangs  04/11/2024, 8:50 AM

## 2024-04-11 NOTE — Progress Notes (Signed)
 Physical Therapy Treatment Patient Details Name: [name] MRN: [id number] DOB: 06/28/1951 Today's Date: 04/11/2024   History of Present Illness Pt is a 73 y.o. female with PMH hyperlipidemia, GERD, multiple sclerosis, IBS, asthma, A-fib not on anticoagulation who presented to the ED with chest discomfort, dizziness, and difficulty walking. MD assessment includes difficulty walking, chest pain, and ataxia.    PT Comments  Pt was sitting in recliner upon arrival. She is alert and agreeable to session. Per SLP, cognition much improved form previously observed earlier in this admission. Pt was able to safely stand and ambulate with use of RW. Required assistance due to vision deficits. Spouse will be assisting pt at home. Both pt/pt's spouse feel safe in her mobility and feel she can return home at DC. Recommend HHPT at DC to maximize her independence and safety with all ADLs. Gait belt issued for home use for additional safety. All other DME needs met.    If plan is discharge home, recommend the following: A little help with walking and/or transfers;A little help with bathing/dressing/bathroom;Direct supervision/assist for financial management;Assist for transportation;Help with stairs or ramp for entrance;Direct supervision/assist for medications management     Equipment Recommendations  Other (comment) (Defer to next level of care)       Precautions / Restrictions Precautions Precautions: Fall Recall of Precautions/Restrictions: Impaired Restrictions Weight Bearing Restrictions Per Provider Order: No     Mobility  Bed Mobility  General bed mobility comments: In recliner pre/post session    Transfers Overall transfer level: Needs assistance Equipment used: Rolling walker (2 wheels) Transfers: Sit to/from Stand Sit to Stand: Contact guard assist  General transfer comment: CGA to stand form recliner 2 x during session. pt's spouse present and will be assisting pt at DC     Ambulation/Gait Ambulation/Gait assistance: Contact guard assist, Supervision Gait Distance (Feet): 100 Feet Assistive device: Rolling walker (2 wheels) Gait Pattern/deviations: Step-through pattern, Shuffle Gait velocity: decreased  General Gait Details: poor heel strike however no physical assistance to stand and ambulate ~ 100 ft. Issued gait belt to pt/pt's spouse for use on stairs to enter home. pt elected not to perform stairs but states confidence she can perform at DC    Balance Overall balance assessment: Needs assistance Sitting-balance support: Feet supported, No upper extremity supported Sitting balance-Leahy Scale: Good     Standing balance support: Bilateral upper extremity supported, During functional activity, Reliant on assistive device for balance Standing balance-Leahy Scale: Fair    Hotel manager: No apparent difficulties  Cognition Arousal: Alert Behavior During Therapy: WFL for tasks assessed/performed    PT - Cognition Comments: pt is alert and able to consistently follow commands throughout. Pt has severe vision deficits wthat impacted session progression more so than cognition Following commands: Impaired Following commands impaired: Follows one step commands with increased time    Cueing Cueing Techniques: Verbal cues, Gestural cues, Tactile cues, Visual cues         Pertinent Vitals/Pain Pain Assessment Pain Assessment: No/denies pain Pain Location: chronic back pain at times Pain Intervention(s): Limited activity within patient's tolerance, Monitored during session, Premedicated before session, Repositioned     PT Goals (current goals can now be found in the care plan section) Acute Rehab PT Goals Patient Stated Goal: go home. Progress towards PT goals: Progressing toward goals    Frequency    Min 3X/week       AM-PAC PT 6 Clicks Mobility   Outcome Measure  Help needed turning from your  back to your  side while in a flat bed without using bedrails?: None Help needed moving from lying on your back to sitting on the side of a flat bed without using bedrails?: None Help needed moving to and from a bed to a chair (including a wheelchair)?: A Little Help needed standing up from a chair using your arms (e.g., wheelchair or bedside chair)?: A Little Help needed to walk in hospital room?: A Little Help needed climbing 3-5 steps with a railing? : A Little 6 Click Score: 20    End of Session Equipment Utilized During Treatment: Gait belt (issued for home use) Activity Tolerance: Patient tolerated treatment well Patient left: in chair;with chair alarm set;with call bell/phone within reach;with family/visitor present Nurse Communication: Mobility status PT Visit Diagnosis: Unsteadiness on feet (R26.81);Muscle weakness (generalized) (M62.81)     Time: 8889-8871 PT Time Calculation (min) (ACUTE ONLY): 18 min  Charges:    $Gait Training: 8-22 mins PT General Charges $$ ACUTE PT VISIT: 1 Visit                    Rankin Essex PTA 04/11/24, 1:17 PM

## 2024-04-11 NOTE — Progress Notes (Signed)
 Occupational Therapy Treatment Patient Details Name: [name] MRN: [id number] DOB: 01/23/1951 Today's Date: 04/11/2024   History of present illness Pt is a 73 y.o. female with PMH hyperlipidemia, GERD, multiple sclerosis, IBS, asthma, A-fib not on anticoagulation who presented to the ED with chest discomfort, dizziness, and difficulty walking. MD assessment includes difficulty walking, chest pain, and ataxia.   OT comments  Pt is supine in bed on arrival. Pleasant and agreeable to OT session. She denies pain. Pt performed bed mobility with independence and STS from EOB to RW with CGA/SBA for hand placement. She ambulated to the bathroom where session focused on safety and compensatory strategies for ADL completion. Pt required supervision for seated bathing on BSC and SBA for standing peri-care and LB dressing. No LOB, wide BOS for better stability and cues for unilateral support on RW or sink for safety. Toilet transfer with CGA and cues for grab bar use or pushing from toilet, supervision for hygiene. Ambulated ~15-20 ft in the room using RW with CGA and cueing d/t visual deficits, however much improved from yesterday. Pt feels safe to return home with assist from husband at this time and was edu on purchasing a shower chair to utilize. She was left seated in recliner with all needs in place and will cont to require skilled acute OT services to maximize her safety and IND to return to PLOF.       If plan is discharge home, recommend the following:  A little help with walking and/or transfers;A little help with bathing/dressing/bathroom;Assistance with cooking/housework;Help with stairs or ramp for entrance   Equipment Recommendations  BSC/3in1;Tub/shower seat    Recommendations for Other Services      Precautions / Restrictions Precautions Precautions: Fall Recall of Precautions/Restrictions: Impaired Restrictions Weight Bearing Restrictions Per Provider Order: No       Mobility  Bed Mobility Overal bed mobility: Independent                  Transfers Overall transfer level: Needs assistance Equipment used: Rolling walker (2 wheels) Transfers: Sit to/from Stand Sit to Stand: Contact guard assist, Min assist           General transfer comment: CGA for STS from EOB and from toilet and BSC during session with cueing for hand placement/safety; ambulated to the bathroom and back to the recliner using RW with CGA/Min A d/t visual deficits and RW management provided at times     Balance Overall balance assessment: Needs assistance Sitting-balance support: Feet supported, No upper extremity supported Sitting balance-Leahy Scale: Good     Standing balance support: Bilateral upper extremity supported, During functional activity, Reliant on assistive device for balance Standing balance-Leahy Scale: Fair Standing balance comment: Min/CGA for RW management                           ADL either performed or assessed with clinical judgement   ADL Overall ADL's : Needs assistance/impaired     Grooming: Oral care;Wash/dry face;Wash/dry hands;Standing;Contact guard assist;Supervision/safety Grooming Details (indicate cue type and reason): much improvement in following directions and ability to safely brush teeth standing at sink, increased time to locate items d/t visual deficits Upper Body Bathing: Set up;Sitting Upper Body Bathing Details (indicate cue type and reason): on BSC Lower Body Bathing: Supervison/ safety;Sitting/lateral leans;Sit to/from stand Lower Body Bathing Details (indicate cue type and reason): seated on BSC in front of sink; able to stand to perform peri-care with  unilateral support on sink and wide BOS Upper Body Dressing : Sitting;Supervision/safety Upper Body Dressing Details (indicate cue type and reason): assist to get arms through Lower Body Dressing: Supervision/safety;Sitting/lateral leans;Sit to/from stand Lower Body  Dressing Details (indicate cue type and reason): to don underwear and socks seated on BSC Toilet Transfer: Contact guard assist;Regular Toilet;Grab bars;Rolling walker (2 wheels)           Functional mobility during ADLs: Contact guard assist;Rolling walker (2 wheels)      Extremity/Trunk Assessment              Vision       Perception     Praxis     Communication Communication Communication: No apparent difficulties   Cognition Arousal: Alert Behavior During Therapy: WFL for tasks assessed/performed                                 Following commands: Impaired Following commands impaired: Follows one step commands with increased time      Cueing   Cueing Techniques: Verbal cues, Gestural cues, Tactile cues, Visual cues  Exercises      Shoulder Instructions       General Comments      Pertinent Vitals/ Pain       Pain Assessment Pain Assessment: No/denies pain Pain Location: chronic back pain at times Pain Intervention(s): Monitored during session  Home Living                                          Prior Functioning/Environment              Frequency  Min 2X/week        Progress Toward Goals  OT Goals(current goals can now be found in the care plan section)  Progress towards OT goals: Progressing toward goals  Acute Rehab OT Goals Patient Stated Goal: go home OT Goal Formulation: With patient Time For Goal Achievement: 04/24/24 Potential to Achieve Goals: Good  Plan      Co-evaluation                 AM-PAC OT 6 Clicks Daily Activity     Outcome Measure   Help from another person eating meals?: None Help from another person taking care of personal grooming?: None Help from another person toileting, which includes using toliet, bedpan, or urinal?: A Little Help from another person bathing (including washing, rinsing, drying)?: A Little Help from another person to put on and taking off  regular upper body clothing?: A Little Help from another person to put on and taking off regular lower body clothing?: A Little 6 Click Score: 20    End of Session Equipment Utilized During Treatment: Rolling walker (2 wheels)  OT Visit Diagnosis: Other abnormalities of gait and mobility (R26.89);Muscle weakness (generalized) (M62.81)   Activity Tolerance Patient tolerated treatment well   Patient Left in chair;with call bell/phone within reach;with chair alarm set   Nurse Communication Mobility status        Time: 9091-9050 OT Time Calculation (min): 41 min  Charges: OT General Charges $OT Visit: 1 Visit OT Treatments $Self Care/Home Management : 38-52 mins  Mearle Drew, OTR/L  04/11/24, 11:35 AM   Chermaine Schnyder E Terisha Losasso 04/11/2024, 11:32 AM

## 2024-04-11 NOTE — Plan of Care (Signed)
 Problem: Education: Goal: Knowledge of General Education information will improve Description: Including pain rating scale, medication(s)/side effects and non-pharmacologic comfort measures 04/11/2024 1300 by Les Delon CROME, RN Outcome: Adequate for Discharge 04/11/2024 0805 by Les Delon CROME, RN Outcome: Progressing   Problem: Health Behavior/Discharge Planning: Goal: Ability to manage health-related needs will improve Outcome: Adequate for Discharge   Problem: Clinical Measurements: Goal: Ability to maintain clinical measurements within normal limits will improve Outcome: Adequate for Discharge Goal: Will remain free from infection Outcome: Adequate for Discharge Goal: Diagnostic test results will improve Outcome: Adequate for Discharge Goal: Respiratory complications will improve Outcome: Adequate for Discharge Goal: Cardiovascular complication will be avoided Outcome: Adequate for Discharge   Problem: Activity: Goal: Risk for activity intolerance will decrease Outcome: Adequate for Discharge   Problem: Nutrition: Goal: Adequate nutrition will be maintained Outcome: Adequate for Discharge   Problem: Coping: Goal: Level of anxiety will decrease Outcome: Adequate for Discharge   Problem: Elimination: Goal: Will not experience complications related to bowel motility Outcome: Adequate for Discharge Goal: Will not experience complications related to urinary retention Outcome: Adequate for Discharge   Problem: Pain Managment: Goal: General experience of comfort will improve and/or be controlled Outcome: Adequate for Discharge   Problem: Safety: Goal: Ability to remain free from injury will improve Outcome: Adequate for Discharge   Problem: Skin Integrity: Goal: Risk for impaired skin integrity will decrease Outcome: Adequate for Discharge   Problem: Education: Goal: Understanding of cardiac disease, CV risk reduction, and recovery process will  improve Outcome: Adequate for Discharge Goal: Individualized Educational Video(s) Outcome: Adequate for Discharge   Problem: Activity: Goal: Ability to tolerate increased activity will improve Outcome: Adequate for Discharge   Problem: Cardiac: Goal: Ability to achieve and maintain adequate cardiovascular perfusion will improve Outcome: Adequate for Discharge   Problem: Health Behavior/Discharge Planning: Goal: Ability to safely manage health-related needs after discharge will improve Outcome: Adequate for Discharge   Problem: Education: Goal: Knowledge of disease or condition will improve Outcome: Adequate for Discharge Goal: Knowledge of secondary prevention will improve (MUST DOCUMENT ALL) Outcome: Adequate for Discharge Goal: Knowledge of patient specific risk factors will improve (DELETE if not current risk factor) Outcome: Adequate for Discharge   Problem: Ischemic Stroke/TIA Tissue Perfusion: Goal: Complications of ischemic stroke/TIA will be minimized Outcome: Adequate for Discharge   Problem: Coping: Goal: Will verbalize positive feelings about self Outcome: Adequate for Discharge Goal: Will identify appropriate support needs Outcome: Adequate for Discharge   Problem: Health Behavior/Discharge Planning: Goal: Ability to manage health-related needs will improve Outcome: Adequate for Discharge Goal: Goals will be collaboratively established with patient/family Outcome: Adequate for Discharge   Problem: Self-Care: Goal: Ability to participate in self-care as condition permits will improve Outcome: Adequate for Discharge Goal: Verbalization of feelings and concerns over difficulty with self-care will improve Outcome: Adequate for Discharge Goal: Ability to communicate needs accurately will improve Outcome: Adequate for Discharge   Problem: Nutrition: Goal: Risk of aspiration will decrease Outcome: Adequate for Discharge Goal: Dietary intake will  improve Outcome: Adequate for Discharge   Problem: Acute Rehab PT Goals(only PT should resolve) Goal: Patient Will Transfer Sit To/From Stand Outcome: Adequate for Discharge Goal: Pt Will Ambulate Outcome: Adequate for Discharge Goal: Pt Will Go Up/Down Stairs Outcome: Adequate for Discharge   Problem: Acute Rehab OT Goals (only OT should resolve) Goal: Pt. Will Perform Lower Body Bathing Outcome: Adequate for Discharge Goal: Pt. Will Perform Lower Body Dressing Outcome: Adequate for Discharge Goal: Pt. Will Transfer  To Toilet Outcome: Adequate for Discharge

## 2024-04-11 NOTE — Plan of Care (Signed)
  Problem: Education: Goal: Knowledge of General Education information will improve Description: Including pain rating scale, medication(s)/side effects and non-pharmacologic comfort measures Outcome: Progressing   Problem: Health Behavior/Discharge Planning: Goal: Ability to manage health-related needs will improve Outcome: Progressing   Problem: Clinical Measurements: Goal: Ability to maintain clinical measurements within normal limits will improve Outcome: Progressing Goal: Will remain free from infection Outcome: Progressing Goal: Diagnostic test results will improve Outcome: Progressing Goal: Respiratory complications will improve Outcome: Progressing Goal: Cardiovascular complication will be avoided Outcome: Progressing   Problem: Activity: Goal: Risk for activity intolerance will decrease Outcome: Progressing   Problem: Nutrition: Goal: Adequate nutrition will be maintained Outcome: Progressing   Problem: Coping: Goal: Level of anxiety will decrease Outcome: Progressing   Problem: Elimination: Goal: Will not experience complications related to bowel motility Outcome: Progressing Goal: Will not experience complications related to urinary retention Outcome: Progressing   Problem: Pain Managment: Goal: General experience of comfort will improve and/or be controlled Outcome: Progressing   Problem: Safety: Goal: Ability to remain free from injury will improve Outcome: Progressing   Problem: Skin Integrity: Goal: Risk for impaired skin integrity will decrease Outcome: Progressing   Problem: Education: Goal: Understanding of cardiac disease, CV risk reduction, and recovery process will improve Outcome: Progressing Goal: Individualized Educational Video(s) Outcome: Progressing   Problem: Activity: Goal: Ability to tolerate increased activity will improve Outcome: Progressing   Problem: Cardiac: Goal: Ability to achieve and maintain adequate cardiovascular  perfusion will improve Outcome: Progressing   Problem: Health Behavior/Discharge Planning: Goal: Ability to safely manage health-related needs after discharge will improve Outcome: Progressing   Problem: Education: Goal: Knowledge of disease or condition will improve Outcome: Progressing Goal: Knowledge of secondary prevention will improve (MUST DOCUMENT ALL) Outcome: Progressing Goal: Knowledge of patient specific risk factors will improve (DELETE if not current risk factor) Outcome: Progressing   Problem: Ischemic Stroke/TIA Tissue Perfusion: Goal: Complications of ischemic stroke/TIA will be minimized Outcome: Progressing   Problem: Coping: Goal: Will verbalize positive feelings about self Outcome: Progressing Goal: Will identify appropriate support needs Outcome: Progressing   Problem: Health Behavior/Discharge Planning: Goal: Ability to manage health-related needs will improve Outcome: Progressing Goal: Goals will be collaboratively established with patient/family Outcome: Progressing   Problem: Self-Care: Goal: Ability to participate in self-care as condition permits will improve Outcome: Progressing Goal: Verbalization of feelings and concerns over difficulty with self-care will improve Outcome: Progressing Goal: Ability to communicate needs accurately will improve Outcome: Progressing   Problem: Nutrition: Goal: Risk of aspiration will decrease Outcome: Progressing Goal: Dietary intake will improve Outcome: Progressing

## 2024-04-16 DIAGNOSIS — M5135 Other intervertebral disc degeneration, thoracolumbar region: Secondary | ICD-10-CM | POA: Diagnosis not present

## 2024-04-16 DIAGNOSIS — G35 Multiple sclerosis: Secondary | ICD-10-CM | POA: Diagnosis not present

## 2024-04-16 DIAGNOSIS — I4891 Unspecified atrial fibrillation: Secondary | ICD-10-CM | POA: Diagnosis not present

## 2024-04-16 DIAGNOSIS — I10 Essential (primary) hypertension: Secondary | ICD-10-CM | POA: Diagnosis not present

## 2024-04-16 DIAGNOSIS — H4922 Sixth [abducent] nerve palsy, left eye: Secondary | ICD-10-CM | POA: Diagnosis not present

## 2024-04-16 DIAGNOSIS — K219 Gastro-esophageal reflux disease without esophagitis: Secondary | ICD-10-CM | POA: Diagnosis not present

## 2024-04-16 DIAGNOSIS — J45909 Unspecified asthma, uncomplicated: Secondary | ICD-10-CM | POA: Diagnosis not present

## 2024-04-16 DIAGNOSIS — I491 Atrial premature depolarization: Secondary | ICD-10-CM | POA: Diagnosis not present

## 2024-04-16 DIAGNOSIS — F0284 Dementia in other diseases classified elsewhere, unspecified severity, with anxiety: Secondary | ICD-10-CM | POA: Diagnosis not present

## 2024-04-19 DIAGNOSIS — E782 Mixed hyperlipidemia: Secondary | ICD-10-CM | POA: Diagnosis not present

## 2024-04-19 DIAGNOSIS — R0789 Other chest pain: Secondary | ICD-10-CM | POA: Diagnosis not present

## 2024-04-19 DIAGNOSIS — K219 Gastro-esophageal reflux disease without esophagitis: Secondary | ICD-10-CM | POA: Diagnosis not present

## 2024-04-19 DIAGNOSIS — F0390 Unspecified dementia without behavioral disturbance: Secondary | ICD-10-CM | POA: Diagnosis not present

## 2024-04-19 DIAGNOSIS — R29898 Other symptoms and signs involving the musculoskeletal system: Secondary | ICD-10-CM | POA: Diagnosis not present

## 2024-04-19 DIAGNOSIS — G35 Multiple sclerosis: Secondary | ICD-10-CM | POA: Diagnosis not present

## 2024-04-22 ENCOUNTER — Telehealth: Payer: Self-pay | Admitting: Internal Medicine

## 2024-04-22 NOTE — Telephone Encounter (Signed)
 Spoke to pt and her husband. Pt reports heart pounding starting almost immediately after taking her daily propranolol  and it lasts about 2 hours. It happens after every daily dose. When asking about vitals, she does not take at home but has been told heart rate is good at her recent doctor's visits. She generally stays well hydrated, no recent changes with other medications to report. No other symptoms, no SOB.  Pt asking if she can change this medication--propranolol  60mg , please advise.

## 2024-04-22 NOTE — Telephone Encounter (Signed)
 Pt c/o medication issue:  1. Name of Medication: propranolol  ER (INDERAL  LA) 60 MG 24 hr capsule   2. How are you currently taking this medication (dosage and times per day)? 60mg  daily  3. Are you having a reaction (difficulty breathing--STAT)? -  4. What is your medication issue? Making heart pound after taking

## 2024-04-23 NOTE — Telephone Encounter (Signed)
 Husband Cherylin) called again stating patient did not take any propanolol today and wants a call back to discuss next steps.

## 2024-04-23 NOTE — Telephone Encounter (Signed)
 Spoke with patient's husband [name] (OK per DPR), informed him message has been sent to Dr. Waddell and his nurse. We are still waiting for reply from Dr. Waddell.  [name] states patient did not take propranolol  today and does not have heart pounding feeling, she also denies any palpitations at this time.  Patient will continue to hold propranolol  and wait for further advisement from Dr. Waddell.

## 2024-04-25 DIAGNOSIS — K219 Gastro-esophageal reflux disease without esophagitis: Secondary | ICD-10-CM | POA: Diagnosis not present

## 2024-04-25 DIAGNOSIS — G35 Multiple sclerosis: Secondary | ICD-10-CM | POA: Diagnosis not present

## 2024-04-25 DIAGNOSIS — F0284 Dementia in other diseases classified elsewhere, unspecified severity, with anxiety: Secondary | ICD-10-CM | POA: Diagnosis not present

## 2024-04-25 DIAGNOSIS — J45909 Unspecified asthma, uncomplicated: Secondary | ICD-10-CM | POA: Diagnosis not present

## 2024-04-25 DIAGNOSIS — I491 Atrial premature depolarization: Secondary | ICD-10-CM | POA: Diagnosis not present

## 2024-04-25 DIAGNOSIS — M5135 Other intervertebral disc degeneration, thoracolumbar region: Secondary | ICD-10-CM | POA: Diagnosis not present

## 2024-04-25 DIAGNOSIS — H4922 Sixth [abducent] nerve palsy, left eye: Secondary | ICD-10-CM | POA: Diagnosis not present

## 2024-04-25 DIAGNOSIS — I4891 Unspecified atrial fibrillation: Secondary | ICD-10-CM | POA: Diagnosis not present

## 2024-04-25 DIAGNOSIS — I10 Essential (primary) hypertension: Secondary | ICD-10-CM | POA: Diagnosis not present

## 2024-04-25 NOTE — Telephone Encounter (Signed)
 Fpollow Up:       Husband is calling agani to see what Dr Waddell had decided.

## 2024-04-25 NOTE — Telephone Encounter (Signed)
 Please review messages below. Pt contacting to follow up.

## 2024-04-29 DIAGNOSIS — F0284 Dementia in other diseases classified elsewhere, unspecified severity, with anxiety: Secondary | ICD-10-CM | POA: Diagnosis not present

## 2024-04-29 DIAGNOSIS — M5135 Other intervertebral disc degeneration, thoracolumbar region: Secondary | ICD-10-CM | POA: Diagnosis not present

## 2024-04-29 DIAGNOSIS — I4891 Unspecified atrial fibrillation: Secondary | ICD-10-CM | POA: Diagnosis not present

## 2024-04-29 DIAGNOSIS — K219 Gastro-esophageal reflux disease without esophagitis: Secondary | ICD-10-CM | POA: Diagnosis not present

## 2024-04-29 DIAGNOSIS — J45909 Unspecified asthma, uncomplicated: Secondary | ICD-10-CM | POA: Diagnosis not present

## 2024-04-29 DIAGNOSIS — G35 Multiple sclerosis: Secondary | ICD-10-CM | POA: Diagnosis not present

## 2024-04-29 DIAGNOSIS — I491 Atrial premature depolarization: Secondary | ICD-10-CM | POA: Diagnosis not present

## 2024-04-29 DIAGNOSIS — I10 Essential (primary) hypertension: Secondary | ICD-10-CM | POA: Diagnosis not present

## 2024-04-29 DIAGNOSIS — H4922 Sixth [abducent] nerve palsy, left eye: Secondary | ICD-10-CM | POA: Diagnosis not present

## 2024-04-30 ENCOUNTER — Inpatient Hospital Stay
Admission: RE | Admit: 2024-04-30 | Discharge: 2024-04-30 | Discharge status: Home / Self Care | Source: Ambulatory Visit | Attending: Family Medicine | Admitting: Family Medicine

## 2024-04-30 DIAGNOSIS — Z1231 Encounter for screening mammogram for malignant neoplasm of breast: Secondary | ICD-10-CM

## 2024-05-01 DIAGNOSIS — F0284 Dementia in other diseases classified elsewhere, unspecified severity, with anxiety: Secondary | ICD-10-CM | POA: Diagnosis not present

## 2024-05-01 DIAGNOSIS — H4922 Sixth [abducent] nerve palsy, left eye: Secondary | ICD-10-CM | POA: Diagnosis not present

## 2024-05-01 DIAGNOSIS — I10 Essential (primary) hypertension: Secondary | ICD-10-CM | POA: Diagnosis not present

## 2024-05-01 DIAGNOSIS — G35 Multiple sclerosis: Secondary | ICD-10-CM | POA: Diagnosis not present

## 2024-05-01 DIAGNOSIS — I491 Atrial premature depolarization: Secondary | ICD-10-CM | POA: Diagnosis not present

## 2024-05-01 DIAGNOSIS — M5135 Other intervertebral disc degeneration, thoracolumbar region: Secondary | ICD-10-CM | POA: Diagnosis not present

## 2024-05-01 DIAGNOSIS — J45909 Unspecified asthma, uncomplicated: Secondary | ICD-10-CM | POA: Diagnosis not present

## 2024-05-01 DIAGNOSIS — I4891 Unspecified atrial fibrillation: Secondary | ICD-10-CM | POA: Diagnosis not present

## 2024-05-01 DIAGNOSIS — K219 Gastro-esophageal reflux disease without esophagitis: Secondary | ICD-10-CM | POA: Diagnosis not present

## 2024-05-02 ENCOUNTER — Ambulatory Visit
Admission: RE | Admit: 2024-05-02 | Discharge: 2024-05-02 | Disposition: A | Discharge status: Home / Self Care | Source: Ambulatory Visit | Attending: Family Medicine | Admitting: Family Medicine

## 2024-05-02 DIAGNOSIS — I2510 Atherosclerotic heart disease of native coronary artery without angina pectoris: Secondary | ICD-10-CM | POA: Insufficient documentation

## 2024-05-02 DIAGNOSIS — J4530 Mild persistent asthma, uncomplicated: Secondary | ICD-10-CM | POA: Diagnosis not present

## 2024-05-02 DIAGNOSIS — R918 Other nonspecific abnormal finding of lung field: Secondary | ICD-10-CM | POA: Diagnosis not present

## 2024-05-02 DIAGNOSIS — I700 Atherosclerosis of aorta: Secondary | ICD-10-CM | POA: Insufficient documentation

## 2024-05-02 DIAGNOSIS — J849 Interstitial pulmonary disease, unspecified: Secondary | ICD-10-CM | POA: Insufficient documentation

## 2024-05-02 DIAGNOSIS — J479 Bronchiectasis, uncomplicated: Secondary | ICD-10-CM | POA: Insufficient documentation

## 2024-05-02 NOTE — Telephone Encounter (Signed)
 Spoke to husband, after speaking with Dr. Waddell.   Made aware MD said that he doubts the heart pounding is Propranolol , however if it's been quiet off of it then it's ok to hold it.  I would recommend taking an extra dose of Flecainide  if the symptoms return. Pt husband agreeable to plan.  Advised to call the office if having to take extra dose so we can ensure tx plan does not need to be changed. He agrees to plan.

## 2024-05-02 NOTE — Telephone Encounter (Signed)
 Patient's husband calling to check on status of his call.

## 2024-05-04 ENCOUNTER — Ambulatory Visit: Payer: Self-pay | Admitting: Pulmonary Disease

## 2024-05-07 ENCOUNTER — Ambulatory Visit (INDEPENDENT_AMBULATORY_CARE_PROVIDER_SITE_OTHER): Payer: Medicare PPO

## 2024-05-07 VITALS — BP 98/63 | HR 69 | Temp 98.2°F | Resp 16 | Ht 66.0 in | Wt 126.0 lb

## 2024-05-07 DIAGNOSIS — M810 Age-related osteoporosis without current pathological fracture: Secondary | ICD-10-CM

## 2024-05-07 MED ORDER — DENOSUMAB 60 MG/ML ~~LOC~~ SOSY
60.0000 mg | PREFILLED_SYRINGE | Freq: Once | SUBCUTANEOUS | Status: AC
Start: 2024-05-07 — End: 2024-05-07
  Administered 2024-05-07: 60 mg via SUBCUTANEOUS
  Filled 2024-05-07: qty 1

## 2024-05-07 NOTE — Progress Notes (Signed)
 Diagnosis: Osteoporosis  Provider:  Mannam, Praveen MD  Procedure: Injection  Prolia  (Denosumab ), Dose: 60 mg, Site: subcutaneous, Number of injections: 1  Injection Site(s): Left arm  Post Care: Patient declined observation  Discharge: Condition: Stable, Destination: Home . AVS Provided  Performed by:  Lendel Quant, RN

## 2024-05-08 DIAGNOSIS — M5135 Other intervertebral disc degeneration, thoracolumbar region: Secondary | ICD-10-CM | POA: Diagnosis not present

## 2024-05-08 DIAGNOSIS — H4922 Sixth [abducent] nerve palsy, left eye: Secondary | ICD-10-CM | POA: Diagnosis not present

## 2024-05-08 DIAGNOSIS — J45909 Unspecified asthma, uncomplicated: Secondary | ICD-10-CM | POA: Diagnosis not present

## 2024-05-08 DIAGNOSIS — I4891 Unspecified atrial fibrillation: Secondary | ICD-10-CM | POA: Diagnosis not present

## 2024-05-08 DIAGNOSIS — K219 Gastro-esophageal reflux disease without esophagitis: Secondary | ICD-10-CM | POA: Diagnosis not present

## 2024-05-08 DIAGNOSIS — G35 Multiple sclerosis: Secondary | ICD-10-CM | POA: Diagnosis not present

## 2024-05-08 DIAGNOSIS — F0284 Dementia in other diseases classified elsewhere, unspecified severity, with anxiety: Secondary | ICD-10-CM | POA: Diagnosis not present

## 2024-05-08 DIAGNOSIS — I10 Essential (primary) hypertension: Secondary | ICD-10-CM | POA: Diagnosis not present

## 2024-05-08 DIAGNOSIS — I491 Atrial premature depolarization: Secondary | ICD-10-CM | POA: Diagnosis not present

## 2024-05-09 DIAGNOSIS — L988 Other specified disorders of the skin and subcutaneous tissue: Secondary | ICD-10-CM | POA: Diagnosis not present

## 2024-05-09 DIAGNOSIS — L0390 Cellulitis, unspecified: Secondary | ICD-10-CM | POA: Diagnosis not present

## 2024-05-10 ENCOUNTER — Other Ambulatory Visit: Payer: Self-pay

## 2024-05-10 MED ORDER — FLECAINIDE ACETATE 50 MG PO TABS: 50.0000 mg | ORAL_TABLET | Freq: Two times a day (BID) | ORAL | 3 refills | Status: AC

## 2024-05-13 DIAGNOSIS — J45909 Unspecified asthma, uncomplicated: Secondary | ICD-10-CM | POA: Diagnosis not present

## 2024-05-13 DIAGNOSIS — I491 Atrial premature depolarization: Secondary | ICD-10-CM | POA: Diagnosis not present

## 2024-05-13 DIAGNOSIS — F0284 Dementia in other diseases classified elsewhere, unspecified severity, with anxiety: Secondary | ICD-10-CM | POA: Diagnosis not present

## 2024-05-13 DIAGNOSIS — G35 Multiple sclerosis: Secondary | ICD-10-CM | POA: Diagnosis not present

## 2024-05-13 DIAGNOSIS — I10 Essential (primary) hypertension: Secondary | ICD-10-CM | POA: Diagnosis not present

## 2024-05-13 DIAGNOSIS — H4922 Sixth [abducent] nerve palsy, left eye: Secondary | ICD-10-CM | POA: Diagnosis not present

## 2024-05-13 DIAGNOSIS — I4891 Unspecified atrial fibrillation: Secondary | ICD-10-CM | POA: Diagnosis not present

## 2024-05-13 DIAGNOSIS — K219 Gastro-esophageal reflux disease without esophagitis: Secondary | ICD-10-CM | POA: Diagnosis not present

## 2024-05-13 DIAGNOSIS — M5135 Other intervertebral disc degeneration, thoracolumbar region: Secondary | ICD-10-CM | POA: Diagnosis not present

## 2024-05-16 DIAGNOSIS — K219 Gastro-esophageal reflux disease without esophagitis: Secondary | ICD-10-CM | POA: Diagnosis not present

## 2024-05-16 DIAGNOSIS — M5135 Other intervertebral disc degeneration, thoracolumbar region: Secondary | ICD-10-CM | POA: Diagnosis not present

## 2024-05-16 DIAGNOSIS — I10 Essential (primary) hypertension: Secondary | ICD-10-CM | POA: Diagnosis not present

## 2024-05-16 DIAGNOSIS — F0284 Dementia in other diseases classified elsewhere, unspecified severity, with anxiety: Secondary | ICD-10-CM | POA: Diagnosis not present

## 2024-05-16 DIAGNOSIS — H4922 Sixth [abducent] nerve palsy, left eye: Secondary | ICD-10-CM | POA: Diagnosis not present

## 2024-05-16 DIAGNOSIS — I4891 Unspecified atrial fibrillation: Secondary | ICD-10-CM | POA: Diagnosis not present

## 2024-05-16 DIAGNOSIS — G35 Multiple sclerosis: Secondary | ICD-10-CM | POA: Diagnosis not present

## 2024-05-16 DIAGNOSIS — J45909 Unspecified asthma, uncomplicated: Secondary | ICD-10-CM | POA: Diagnosis not present

## 2024-05-16 DIAGNOSIS — I491 Atrial premature depolarization: Secondary | ICD-10-CM | POA: Diagnosis not present

## 2024-05-18 DIAGNOSIS — R051 Acute cough: Secondary | ICD-10-CM | POA: Diagnosis not present

## 2024-05-18 DIAGNOSIS — R6883 Chills (without fever): Secondary | ICD-10-CM | POA: Diagnosis not present

## 2024-05-18 DIAGNOSIS — R52 Pain, unspecified: Secondary | ICD-10-CM | POA: Diagnosis not present

## 2024-05-18 DIAGNOSIS — Z03818 Encounter for observation for suspected exposure to other biological agents ruled out: Secondary | ICD-10-CM | POA: Diagnosis not present

## 2024-05-18 DIAGNOSIS — J029 Acute pharyngitis, unspecified: Secondary | ICD-10-CM | POA: Diagnosis not present

## 2024-05-21 ENCOUNTER — Ambulatory Visit
Admission: RE | Admit: 2024-05-21 | Discharge: 2024-05-21 | Disposition: A | Discharge status: Home / Self Care | Source: Ambulatory Visit | Attending: Family Medicine | Admitting: Family Medicine

## 2024-05-21 ENCOUNTER — Other Ambulatory Visit: Payer: Self-pay | Admitting: Family Medicine

## 2024-05-21 DIAGNOSIS — R059 Cough, unspecified: Secondary | ICD-10-CM

## 2024-05-22 DIAGNOSIS — F0284 Dementia in other diseases classified elsewhere, unspecified severity, with anxiety: Secondary | ICD-10-CM | POA: Diagnosis not present

## 2024-05-22 DIAGNOSIS — K219 Gastro-esophageal reflux disease without esophagitis: Secondary | ICD-10-CM | POA: Diagnosis not present

## 2024-05-22 DIAGNOSIS — I491 Atrial premature depolarization: Secondary | ICD-10-CM | POA: Diagnosis not present

## 2024-05-22 DIAGNOSIS — H4922 Sixth [abducent] nerve palsy, left eye: Secondary | ICD-10-CM | POA: Diagnosis not present

## 2024-05-22 DIAGNOSIS — J45909 Unspecified asthma, uncomplicated: Secondary | ICD-10-CM | POA: Diagnosis not present

## 2024-05-22 DIAGNOSIS — G35 Multiple sclerosis: Secondary | ICD-10-CM | POA: Diagnosis not present

## 2024-05-22 DIAGNOSIS — M5135 Other intervertebral disc degeneration, thoracolumbar region: Secondary | ICD-10-CM | POA: Diagnosis not present

## 2024-05-22 DIAGNOSIS — I10 Essential (primary) hypertension: Secondary | ICD-10-CM | POA: Diagnosis not present

## 2024-05-22 DIAGNOSIS — I4891 Unspecified atrial fibrillation: Secondary | ICD-10-CM | POA: Diagnosis not present

## 2024-05-28 DIAGNOSIS — K219 Gastro-esophageal reflux disease without esophagitis: Secondary | ICD-10-CM | POA: Diagnosis not present

## 2024-05-28 DIAGNOSIS — J45909 Unspecified asthma, uncomplicated: Secondary | ICD-10-CM | POA: Diagnosis not present

## 2024-05-28 DIAGNOSIS — I491 Atrial premature depolarization: Secondary | ICD-10-CM | POA: Diagnosis not present

## 2024-05-28 DIAGNOSIS — M5135 Other intervertebral disc degeneration, thoracolumbar region: Secondary | ICD-10-CM | POA: Diagnosis not present

## 2024-05-28 DIAGNOSIS — H4922 Sixth [abducent] nerve palsy, left eye: Secondary | ICD-10-CM | POA: Diagnosis not present

## 2024-05-28 DIAGNOSIS — G35 Multiple sclerosis: Secondary | ICD-10-CM | POA: Diagnosis not present

## 2024-05-28 DIAGNOSIS — F0284 Dementia in other diseases classified elsewhere, unspecified severity, with anxiety: Secondary | ICD-10-CM | POA: Diagnosis not present

## 2024-05-28 DIAGNOSIS — I4891 Unspecified atrial fibrillation: Secondary | ICD-10-CM | POA: Diagnosis not present

## 2024-05-28 DIAGNOSIS — I10 Essential (primary) hypertension: Secondary | ICD-10-CM | POA: Diagnosis not present

## 2024-05-31 DIAGNOSIS — I491 Atrial premature depolarization: Secondary | ICD-10-CM | POA: Diagnosis not present

## 2024-05-31 DIAGNOSIS — H4922 Sixth [abducent] nerve palsy, left eye: Secondary | ICD-10-CM | POA: Diagnosis not present

## 2024-05-31 DIAGNOSIS — M5135 Other intervertebral disc degeneration, thoracolumbar region: Secondary | ICD-10-CM | POA: Diagnosis not present

## 2024-05-31 DIAGNOSIS — K219 Gastro-esophageal reflux disease without esophagitis: Secondary | ICD-10-CM | POA: Diagnosis not present

## 2024-05-31 DIAGNOSIS — I10 Essential (primary) hypertension: Secondary | ICD-10-CM | POA: Diagnosis not present

## 2024-05-31 DIAGNOSIS — F0284 Dementia in other diseases classified elsewhere, unspecified severity, with anxiety: Secondary | ICD-10-CM | POA: Diagnosis not present

## 2024-05-31 DIAGNOSIS — I4891 Unspecified atrial fibrillation: Secondary | ICD-10-CM | POA: Diagnosis not present

## 2024-05-31 DIAGNOSIS — G35 Multiple sclerosis: Secondary | ICD-10-CM | POA: Diagnosis not present

## 2024-05-31 DIAGNOSIS — J45909 Unspecified asthma, uncomplicated: Secondary | ICD-10-CM | POA: Diagnosis not present

## 2024-06-04 DIAGNOSIS — I4891 Unspecified atrial fibrillation: Secondary | ICD-10-CM | POA: Diagnosis not present

## 2024-06-04 DIAGNOSIS — I10 Essential (primary) hypertension: Secondary | ICD-10-CM | POA: Diagnosis not present

## 2024-06-04 DIAGNOSIS — J45909 Unspecified asthma, uncomplicated: Secondary | ICD-10-CM | POA: Diagnosis not present

## 2024-06-04 DIAGNOSIS — I491 Atrial premature depolarization: Secondary | ICD-10-CM | POA: Diagnosis not present

## 2024-06-04 DIAGNOSIS — G35 Multiple sclerosis: Secondary | ICD-10-CM | POA: Diagnosis not present

## 2024-06-04 DIAGNOSIS — M5135 Other intervertebral disc degeneration, thoracolumbar region: Secondary | ICD-10-CM | POA: Diagnosis not present

## 2024-06-04 DIAGNOSIS — F0284 Dementia in other diseases classified elsewhere, unspecified severity, with anxiety: Secondary | ICD-10-CM | POA: Diagnosis not present

## 2024-06-04 DIAGNOSIS — H4922 Sixth [abducent] nerve palsy, left eye: Secondary | ICD-10-CM | POA: Diagnosis not present

## 2024-06-04 DIAGNOSIS — K219 Gastro-esophageal reflux disease without esophagitis: Secondary | ICD-10-CM | POA: Diagnosis not present

## 2024-06-11 DIAGNOSIS — F0284 Dementia in other diseases classified elsewhere, unspecified severity, with anxiety: Secondary | ICD-10-CM | POA: Diagnosis not present

## 2024-06-11 DIAGNOSIS — I491 Atrial premature depolarization: Secondary | ICD-10-CM | POA: Diagnosis not present

## 2024-06-11 DIAGNOSIS — K219 Gastro-esophageal reflux disease without esophagitis: Secondary | ICD-10-CM | POA: Diagnosis not present

## 2024-06-11 DIAGNOSIS — I4891 Unspecified atrial fibrillation: Secondary | ICD-10-CM | POA: Diagnosis not present

## 2024-06-11 DIAGNOSIS — I10 Essential (primary) hypertension: Secondary | ICD-10-CM | POA: Diagnosis not present

## 2024-06-11 DIAGNOSIS — M5135 Other intervertebral disc degeneration, thoracolumbar region: Secondary | ICD-10-CM | POA: Diagnosis not present

## 2024-06-11 DIAGNOSIS — H4922 Sixth [abducent] nerve palsy, left eye: Secondary | ICD-10-CM | POA: Diagnosis not present

## 2024-06-11 DIAGNOSIS — G35 Multiple sclerosis: Secondary | ICD-10-CM | POA: Diagnosis not present

## 2024-06-11 DIAGNOSIS — J45909 Unspecified asthma, uncomplicated: Secondary | ICD-10-CM | POA: Diagnosis not present

## 2024-06-13 DIAGNOSIS — M5135 Other intervertebral disc degeneration, thoracolumbar region: Secondary | ICD-10-CM | POA: Diagnosis not present

## 2024-06-13 DIAGNOSIS — I10 Essential (primary) hypertension: Secondary | ICD-10-CM | POA: Diagnosis not present

## 2024-06-13 DIAGNOSIS — H4922 Sixth [abducent] nerve palsy, left eye: Secondary | ICD-10-CM | POA: Diagnosis not present

## 2024-06-13 DIAGNOSIS — J45909 Unspecified asthma, uncomplicated: Secondary | ICD-10-CM | POA: Diagnosis not present

## 2024-06-13 DIAGNOSIS — K219 Gastro-esophageal reflux disease without esophagitis: Secondary | ICD-10-CM | POA: Diagnosis not present

## 2024-06-13 DIAGNOSIS — G35 Multiple sclerosis: Secondary | ICD-10-CM | POA: Diagnosis not present

## 2024-06-13 DIAGNOSIS — F0284 Dementia in other diseases classified elsewhere, unspecified severity, with anxiety: Secondary | ICD-10-CM | POA: Diagnosis not present

## 2024-06-13 DIAGNOSIS — I4891 Unspecified atrial fibrillation: Secondary | ICD-10-CM | POA: Diagnosis not present

## 2024-06-13 DIAGNOSIS — I491 Atrial premature depolarization: Secondary | ICD-10-CM | POA: Diagnosis not present

## 2024-06-24 DIAGNOSIS — F02A0 Dementia in other diseases classified elsewhere, mild, without behavioral disturbance, psychotic disturbance, mood disturbance, and anxiety: Secondary | ICD-10-CM | POA: Diagnosis not present

## 2024-06-24 DIAGNOSIS — G35 Multiple sclerosis: Secondary | ICD-10-CM | POA: Diagnosis not present

## 2024-06-24 DIAGNOSIS — G301 Alzheimer's disease with late onset: Secondary | ICD-10-CM | POA: Diagnosis not present

## 2024-06-27 DIAGNOSIS — L0390 Cellulitis, unspecified: Secondary | ICD-10-CM | POA: Diagnosis not present

## 2024-06-27 DIAGNOSIS — Z85828 Personal history of other malignant neoplasm of skin: Secondary | ICD-10-CM | POA: Diagnosis not present

## 2024-06-27 DIAGNOSIS — Z872 Personal history of diseases of the skin and subcutaneous tissue: Secondary | ICD-10-CM | POA: Diagnosis not present

## 2024-06-27 DIAGNOSIS — L578 Other skin changes due to chronic exposure to nonionizing radiation: Secondary | ICD-10-CM | POA: Diagnosis not present

## 2024-06-27 DIAGNOSIS — L570 Actinic keratosis: Secondary | ICD-10-CM | POA: Diagnosis not present

## 2024-07-21 ENCOUNTER — Other Ambulatory Visit: Payer: Self-pay | Admitting: Pulmonary Disease

## 2024-07-21 DIAGNOSIS — J4530 Mild persistent asthma, uncomplicated: Secondary | ICD-10-CM

## 2024-07-24 DIAGNOSIS — K580 Irritable bowel syndrome with diarrhea: Secondary | ICD-10-CM | POA: Diagnosis not present

## 2024-07-24 DIAGNOSIS — F411 Generalized anxiety disorder: Secondary | ICD-10-CM | POA: Diagnosis not present

## 2024-07-24 DIAGNOSIS — M810 Age-related osteoporosis without current pathological fracture: Secondary | ICD-10-CM | POA: Diagnosis not present

## 2024-07-24 DIAGNOSIS — G309 Alzheimer's disease, unspecified: Secondary | ICD-10-CM | POA: Diagnosis not present

## 2024-07-24 DIAGNOSIS — Z1331 Encounter for screening for depression: Secondary | ICD-10-CM | POA: Diagnosis not present

## 2024-07-24 DIAGNOSIS — Z23 Encounter for immunization: Secondary | ICD-10-CM | POA: Diagnosis not present

## 2024-07-24 DIAGNOSIS — E782 Mixed hyperlipidemia: Secondary | ICD-10-CM | POA: Diagnosis not present

## 2024-07-24 DIAGNOSIS — Z131 Encounter for screening for diabetes mellitus: Secondary | ICD-10-CM | POA: Diagnosis not present

## 2024-07-24 DIAGNOSIS — Z0000 Encounter for general adult medical examination without abnormal findings: Secondary | ICD-10-CM | POA: Diagnosis not present

## 2024-07-24 DIAGNOSIS — J45909 Unspecified asthma, uncomplicated: Secondary | ICD-10-CM | POA: Diagnosis not present

## 2024-07-24 DIAGNOSIS — G35D Multiple sclerosis, unspecified: Secondary | ICD-10-CM | POA: Diagnosis not present

## 2024-07-30 DIAGNOSIS — H5712 Ocular pain, left eye: Secondary | ICD-10-CM | POA: Diagnosis not present

## 2024-08-05 DIAGNOSIS — M549 Dorsalgia, unspecified: Secondary | ICD-10-CM | POA: Diagnosis not present

## 2024-08-13 ENCOUNTER — Ambulatory Visit: Admitting: Student

## 2024-08-15 NOTE — Progress Notes (Unsigned)
  Electrophysiology Office Note:   Date:  08/16/2024  ID:  HENESSY ROHRER, DOB [birth year], MRN [id number]  Primary Cardiologist: None Primary Heart Failure: None Electrophysiologist: Danelle Birmingham, MD      History of Present Illness:   [name] is a 73 y.o. female with h/o PAC's, HLD, relapsing remitting  MS, mild bronchiectasis, frequent falls seen today for acute visit due to palpitations.    Patient reports she has been feeling poorly. She stopped taking the propranolol  a while back as it made her feel worse. She was fatigued. She now notes an increase in palpitations that are uncomfortable. She reports an increase in acid reflux and is on protonix .   She denies chest pain, palpitations, dyspnea, PND, orthopnea, nausea, vomiting, dizziness, syncope, edema, weight gain, or early satiety.   Review of systems complete and found to be negative unless listed in HPI.   EP Information / Studies Reviewed:    EKG is ordered today. Personal review as below.  EKG Interpretation Date/Time:  Friday August 16 2024 09:49:26 EST Ventricular Rate:  69 PR Interval:  192 QRS Duration:  86 QT Interval:  368 QTC Calculation: 394 R Axis:   87  Text Interpretation: Normal sinus rhythm Confirmed by Aniceto Jarvis ([id number]) on 08/16/2024 10:06:59 AM    Arrhythmia / AAD / Pertinent EP Studies PAC's   Flecainide  initiated ~ 02/2021 >   Risk Assessment/Calculations:              Physical Exam:   VS:  BP 137/78   Pulse 69   Ht 5' 6 (1.676 m)   Wt 118 lb (53.5 kg)   LMP  (LMP Unknown)   SpO2 100%   BMI 19.05 kg/m    Wt Readings from Last 3 Encounters:  08/16/24 118 lb (53.5 kg)  05/07/24 126 lb (57.2 kg)  04/09/24 128 lb (58.1 kg)     GEN: Well nourished, well developed in no acute distress NECK: No JVD; No carotid bruits CARDIAC: Regular rate and rhythm, no murmurs, rubs, gallops RESPIRATORY:  Clear to auscultation without rales, wheezing or rhonchi  ABDOMEN: Soft, non-tender,  non-distended EXTREMITIES:  No edema; No deformity   ASSESSMENT AND PLAN:    Palpitations  PAC's  High Risk Medication Monitoring: Flecainide  -increase in palpitations, assess cardiac monitor to review PVC burden and rule out other arrhythmia  -EKG with NSR  -continue flecainide   -pt confirms she is not taking propranolol  as it made her feel poorly. She stopped in 04/2024  -discussed trying a different beta blocker  -PR lengthening waxes / wanes > 166-253ms   Relapsing & Remitting MS  -per Primary / Neuro   Follow up with Dr. Birmingham / EP APP in 6 weeks   Signed, Jarvis Aniceto, NP-C, AGACNP-BC [facility] - Electrophysiology  08/16/2024, 10:07 AM

## 2024-08-16 ENCOUNTER — Ambulatory Visit: Attending: Pulmonary Disease | Admitting: Pulmonary Disease

## 2024-08-16 ENCOUNTER — Ambulatory Visit: Attending: Pulmonary Disease

## 2024-08-16 ENCOUNTER — Encounter: Payer: Self-pay | Admitting: Pulmonary Disease

## 2024-08-16 VITALS — BP 137/78 | HR 69 | Ht 66.0 in | Wt 118.0 lb

## 2024-08-16 DIAGNOSIS — Z5181 Encounter for therapeutic drug level monitoring: Secondary | ICD-10-CM | POA: Diagnosis not present

## 2024-08-16 DIAGNOSIS — R002 Palpitations: Secondary | ICD-10-CM

## 2024-08-16 DIAGNOSIS — I491 Atrial premature depolarization: Secondary | ICD-10-CM | POA: Diagnosis not present

## 2024-08-16 DIAGNOSIS — Z79899 Other long term (current) drug therapy: Secondary | ICD-10-CM | POA: Diagnosis not present

## 2024-08-16 NOTE — Progress Notes (Unsigned)
Enrolled for [employer] to mail a ZIO XT long term holter monitor to the patients address on file.  °Dr. Taylor to read. °

## 2024-08-16 NOTE — Patient Instructions (Signed)
 Medication Instructions:   Your physician recommends that you continue on your current medications as directed. Please refer to the Current Medication list given to you today.   *If you need a refill on your cardiac medications before your next appointment, please call your pharmacy*  Lab Work: NONE ORDERED  TODAY   If you have labs (blood work) drawn today and your tests are completely normal, you will receive your results only by: MyChart Message (if you have MyChart) OR A paper copy in the mail If you have any lab test that is abnormal or we need to change your treatment, we will call you to review the results.  Testing/Procedures: Your physician has recommended that you wear an event monitor. Event monitors are medical devices that record the heart's electrical activity. Doctors most often us  these monitors to diagnose arrhythmias. Arrhythmias are problems with the speed or rhythm of the heartbeat. The monitor is a small, portable device. You can wear one while you do your normal daily activities. This is usually used to diagnose what is causing palpitations/syncope (passing out).     Follow-Up: At [facility], you and your health needs are our priority.  As part of our continuing mission to provide you with exceptional heart care, our providers are all part of one team.  This team includes your primary Cardiologist (physician) and Advanced Practice Providers or APPs (Physician Assistants and Nurse Practitioners) who all work together to provide you with the care you need, when you need it.  Your next appointment:   6 week(s)   Provider:   Daphne Barrack, NP ( CONTACT  CASSIE HALL/ ANGELINE HAMMER FOR EP SCHEDULING ISSUES )    We recommend signing up for the patient portal called MyChart.  Sign up information is provided on this After Visit Summary.  MyChart is used to connect with patients for Virtual Visits (Telemedicine).  Patients are able to view lab/test results,  encounter notes, upcoming appointments, etc.  Non-urgent messages can be sent to your provider as well.   To learn more about what you can do with MyChart, go to [email or web address].   Other Instructions     ZIO XT- Long Term Monitor Instructions  Your physician has requested you wear a ZIO patch monitor for 7  days.  This is a single patch monitor. [employer] supplies one patch monitor per enrollment. Additional stickers are not available. Please do not apply patch if you will be having a Nuclear Stress Test,  Echocardiogram, Cardiac CT, MRI, or Chest Xray during the period you would be wearing the  monitor. The patch cannot be worn during these tests. You cannot remove and re-apply the  ZIO XT patch monitor.  Your ZIO patch monitor will be mailed 3 day USPS to your address on file. It may take 3-5 days  to receive your monitor after you have been enrolled.  Once you have received your monitor, please review the enclosed instructions. Your monitor  has already been registered assigning a specific monitor serial # to you.  Billing and Patient Assistance Program Information  We have supplied [employer] with any of your insurance information on file for billing purposes. [employer] offers a sliding scale Patient Assistance Program for patients that do not have  insurance, or whose insurance does not completely cover the cost of the ZIO monitor.  You must apply for the Patient Assistance Program to qualify for this discounted rate.  To apply, please call [employer] at [phone], select option 4, select  option 2, ask to apply for  Patient Assistance Program. Meredeth will ask your household income, and how many people  are in your household. They will quote your out-of-pocket cost based on that information.  [employer] will also be able to set up a 69-month, interest-free payment plan if needed.  Applying the monitor   Shave hair from upper left chest.  Hold abrader disc by orange tab. Rub  abrader in 40 strokes over the upper left chest as  indicated in your monitor instructions.  Clean area with 4 enclosed alcohol pads. Let dry.  Apply patch as indicated in monitor instructions. Patch will be placed under collarbone on left  side of chest with arrow pointing upward.  Rub patch adhesive wings for 2 minutes. Remove white label marked 1. Remove the white  label marked 2. Rub patch adhesive wings for 2 additional minutes.  While looking in a mirror, press and release button in center of patch. A small green light will  flash 3-4 times. This will be your only indicator that the monitor has been turned on.  Do not shower for the first 24 hours. You may shower after the first 24 hours.  Press the button if you feel a symptom. You will hear a small click. Record Date, Time and  Symptom in the Patient Logbook.  When you are ready to remove the patch, follow instructions on the last 2 pages of Patient  Logbook. Stick patch monitor onto the last page of Patient Logbook.  Place Patient Logbook in the blue and white box. Use locking tab on box and tape box closed  securely. The blue and white box has prepaid postage on it. Please place it in the mailbox as  soon as possible. Your physician should have your test results approximately 7 days after the  monitor has been mailed back to [facility].  Call [facility] Customer Care at [phone] if you have questions regarding  your ZIO XT patch monitor. Call them immediately if you see an orange light blinking on your  monitor.  If your monitor falls off in less than 4 days, contact our Monitor department at [phone].  If your monitor becomes loose or falls off after 4 days call [employer] at [phone] for  suggestions on securing your monitor\

## 2024-08-19 ENCOUNTER — Encounter: Payer: Self-pay | Admitting: Pulmonary Disease

## 2024-08-19 ENCOUNTER — Ambulatory Visit: Admitting: Pulmonary Disease

## 2024-08-19 ENCOUNTER — Other Ambulatory Visit: Payer: Self-pay | Admitting: Pharmacist

## 2024-08-19 VITALS — BP 118/60 | HR 65 | Temp 97.8°F | Ht 66.0 in | Wt 118.2 lb

## 2024-08-19 DIAGNOSIS — G35D Multiple sclerosis, unspecified: Secondary | ICD-10-CM

## 2024-08-19 DIAGNOSIS — J4530 Mild persistent asthma, uncomplicated: Secondary | ICD-10-CM | POA: Diagnosis not present

## 2024-08-19 NOTE — Progress Notes (Signed)
 Next denosumab  SQ due on 11/03/2024. Referral placed to [facility] [phone]) - [facility]) Diagnosis: age-related osteoporosis  Dose: 60 mg SQ every 6 months  Last Clinic Visit: 02/06/2024 Next Clinic Visit: 02/05/2025  Last denosumab  dose: 05/07/2024  Orders placed for denosumab . No premedicatons required.   Infusion center will reach out to schedule once benefits are verified.  Sherry Pennant, PharmD, MPH, BCPS, CPP Clinical Pharmacist [facility])

## 2024-08-19 NOTE — Progress Notes (Unsigned)
 Synopsis: Referred in by Loreli Kins, MD   Subjective:   PATIENT ID: [name] GENDER: female DOB: 05/30/1951, MRN: [id number]  Chief Complaint  Patient presents with   Asthma    Occasional SOB. No wheezing or cough.  Symbicort - Once a day. Albuterol - PRN.     HPI Ms. [name] is a pleasant 72 year old female patient with a past medical history of multiple sclerosis neurodeficit including difficulty ambulating, decreased vision and memory deficit, mild persistent asthma presenting today to the pulmonary clinic to establish care.  She saw Dr. Meade a year ago when she presented with ongoing dry cough associated with shortness of breath.  She was prescribed Symbicort  which had helped her significantly.  PFT 09/24 with lower limit of normal FVC, lower limit of normal FEV1 and increased FEV1 to FVC ratio.  Normal lung volumes.  And mildly reduced DLCO.  No significant response to bronchodilators.  CT scan was obtained on 05/24 which did not show any signs of interstitial lung disease.  She is feeling well on Symbicort  80-4.52 puffs twice daily.  Reports rarely needing to use her rescue inhaler.  She is still experiencing inability to take a deep breath sometimes and needing to be on however that has been less frequent.  Family history -denies any family history of pulmonary diseases.  Social history -never smoker.  OV 02/28/2024 - Ms. [name] is here for follow up on her asthma. She was started on Symbicort  80 and is doing well on that. Her shortness of breath and dry cough have decreased significantly. She complains of intermittent heart burn and had a history of gerd. We discussed needing to go back on acid reflux medication. She is also describing blurry vision and havent seen her neurologist  in over a year, I recommended her to call the office for a follow up apointment.   ROS All systems were reviewed and are negative except for the above.  Objective:   Vitals:   08/19/24  1419  BP: 118/60  Pulse: 65  Temp: 97.8 F (36.6 C)  SpO2: 98%  Weight: 118 lb 3.2 oz (53.6 kg)  Height: 5' 6 (1.676 m)   98% on RA BMI Readings from Last 3 Encounters:  08/19/24 19.08 kg/m  08/16/24 19.05 kg/m  05/07/24 20.34 kg/m   Wt Readings from Last 3 Encounters:  08/19/24 118 lb 3.2 oz (53.6 kg)  08/16/24 118 lb (53.5 kg)  05/07/24 126 lb (57.2 kg)    Physical Exam GEN: NAD, Healthy Appearing HEENT: Supple Neck, Reactive Pupils, EOMI  CVS: Normal S1, Normal S2, RRR, No murmurs or ES appreciated  Lungs: Clear bilateral air entry.  Abdomen: Soft, non tender, non distended, + BS  Extremities: Warm and well perfused, No edema  Skin: No suspicious lesions appreciated  Psych: Normal Affect  Ancillary Information   CBC    Component Value Date/Time   WBC 5.9 04/11/2024 0337   RBC 3.74 (L) 04/11/2024 0337   HGB 11.6 (L) 04/11/2024 0337   HGB 12.2 09/20/2019 1052   HCT 35.5 (L) 04/11/2024 0337   HCT 36.3 09/20/2019 1052   PLT 172 04/11/2024 0337   PLT 183 09/20/2019 1052   MCV 94.9 04/11/2024 0337   MCV 91 09/20/2019 1052   MCH 31.0 04/11/2024 0337   MCHC 32.7 04/11/2024 0337   RDW 12.5 04/11/2024 0337   RDW 12.8 09/20/2019 1052   LYMPHSABS 1.7 04/19/2023 1144   MONOABS 0.4 04/19/2023 1144   EOSABS 0.1 04/19/2023 1144  BASOSABS 0.1 04/19/2023 1144   Labs and imaging were reviewed.     Latest Ref Rng & Units 06/13/2023    2:33 PM  PFT Results  FVC-Pre L 2.53   FVC-Predicted Pre % 77   FVC-Post L 2.38   FVC-Predicted Post % 72   Pre FEV1/FVC % % 81   Post FEV1/FCV % % 82   FEV1-Pre L 2.04   FEV1-Predicted Pre % 82   FEV1-Post L 1.95   DLCO uncorrected ml/min/mmHg 15.13   DLCO UNC% % 72   DLVA Predicted % 90   TLC L 4.38   TLC % Predicted % 81   RV % Predicted % 77     Assessment & Plan:  Ms. [name] is a pleasant 73 year old female patient with a past medical history of multiple sclerosis neurodeficit including difficulty ambulating, decreased  vision and memory deficit, mild persistent asthma presenting today to the pulmonary clinic to establish care.  #Mild persistent asthma   []  Agree w/ Budesonide -Formoterol  [Symbicort ] 80-4.5 2 puffs twice a day. And advised use of 2puffs PRN as well when needed.  []  High Res CT chest as prior one showed some signs of ILD.  []  Levalbuterol  as needed.  []  PPI  []  I will see her back in 6 months and if need be will go up on her symbicort .   MS - followed clinically with Dr. Harden zeid.   RTC 6 months.   I spent 20 minutes caring for this patient today, including preparing to see the patient, obtaining a medical history , reviewing a separately obtained history, performing a medically appropriate examination and/or evaluation, counseling and educating the patient/family/caregiver, documenting clinical information in the electronic health record, and independently interpreting results (not separately reported/billed) and communicating results to the patient/family/caregiver  Darrin Barn, MD [facility] 08/19/2024 2:22 PM

## 2024-08-20 ENCOUNTER — Telehealth (HOSPITAL_COMMUNITY): Payer: Self-pay

## 2024-08-20 NOTE — Telephone Encounter (Signed)
 Auth Submission: APPROVED Site of care: Site of care: [facility] Payer: [employer] Medicare, [employer] Supplement Medication & CPT/J Code(s) submitted: Prolia  (Denosumab ) [id number] Diagnosis Code: M81.0 Route of submission (phone, fax, portal):  Phone # Fax # Auth type: Buy/Bill HB Units/visits requested: 60mg  q78months Reference number: [id number] Approval from: 04/14/23 to 10/09/25    Approval letter has been scanned into media tab.

## 2024-08-30 DIAGNOSIS — R002 Palpitations: Secondary | ICD-10-CM | POA: Diagnosis not present

## 2024-09-04 DIAGNOSIS — M461 Sacroiliitis, not elsewhere classified: Secondary | ICD-10-CM | POA: Diagnosis not present

## 2024-09-04 DIAGNOSIS — G35D Multiple sclerosis, unspecified: Secondary | ICD-10-CM | POA: Diagnosis not present

## 2024-09-04 DIAGNOSIS — Z9889 Other specified postprocedural states: Secondary | ICD-10-CM | POA: Diagnosis not present

## 2024-09-04 DIAGNOSIS — M5416 Radiculopathy, lumbar region: Secondary | ICD-10-CM | POA: Diagnosis not present

## 2024-09-15 DIAGNOSIS — R002 Palpitations: Secondary | ICD-10-CM | POA: Diagnosis not present

## 2024-09-17 ENCOUNTER — Ambulatory Visit: Payer: Self-pay | Admitting: Pulmonary Disease

## 2024-09-18 ENCOUNTER — Ambulatory Visit
Admission: RE | Admit: 2024-09-18 | Discharge: 2024-09-18 | Disposition: A | Discharge status: Home / Self Care | Source: Ambulatory Visit | Attending: Family Medicine | Admitting: Family Medicine

## 2024-09-18 ENCOUNTER — Other Ambulatory Visit (HOSPITAL_COMMUNITY): Payer: Self-pay | Admitting: Family Medicine

## 2024-09-18 DIAGNOSIS — S060X9A Concussion with loss of consciousness of unspecified duration, initial encounter: Secondary | ICD-10-CM | POA: Insufficient documentation

## 2024-09-29 NOTE — Progress Notes (Deleted)
" °  Electrophysiology Office Note:   Date:  09/29/2024  ID:  KADY TOOTHAKER, DOB [birth year], MRN [id number]  Primary Cardiologist: None Primary Heart Failure: None Electrophysiologist: Danelle Birmingham, MD  {Click to update primary MD,subspecialty MD or APP then REFRESH:1}    History of Present Illness:   [name] is a 73 y.o. female with h/o PAC's, HLD, relapsing remitting  MS, mild bronchiectasis, frequent falls seen today for routine electrophysiology followup.   Since last being seen in our clinic the patient reports doing ***.    She ***denies chest pain, palpitations, dyspnea, PND, orthopnea, nausea, vomiting, dizziness, syncope, edema, weight gain, or early satiety.   Review of systems complete and found to be negative unless listed in HPI.   EP Information / Studies Reviewed:    EKG is not ordered today. EKG from 08/16/24 reviewed which showed NSR 69 bpm      Arrhythmia / AAD / Pertinent EP Studies PAC's   Flecainide  initiated ~ 02/2021 > Cardiac Monitor 08/2024 > NSR 53-118 bpm, no VT, SVT, or AF.  No prolonged pauses.  Less than 1% PAC's/PVC's.    Risk Assessment/Calculations:     No BP recorded.  {Refresh Note OR Click here to enter BP  :1}***        Physical Exam:   VS:  LMP  (LMP Unknown)    Wt Readings from Last 3 Encounters:  08/19/24 118 lb 3.2 oz (53.6 kg)  08/16/24 118 lb (53.5 kg)  05/07/24 126 lb (57.2 kg)     GEN: Well nourished, well developed in no acute distress NECK: No JVD; No carotid bruits CARDIAC: {EPRHYTHM:28826}, no murmurs, rubs, gallops RESPIRATORY:  Clear to auscultation without rales, wheezing or rhonchi  ABDOMEN: Soft, non-tender, non-distended EXTREMITIES:  No edema; No deformity   ASSESSMENT AND PLAN:    Palpitations  PAC's  High Risk Medication Monitoring: Flecainide  -continue flecainide  50mg  BID  -try a different beta blocker > stopped propranolol  in 04/2024  -cardiac monitor with <1% ectopy    Relapsing & Remitting MS   -per Primary / Neuro    Follow up with EP APP {EPFOLLOW LE:71826}  Signed, Daphne Barrack, NP-C, AGACNP-BC [facility] - Electrophysiology  09/29/2024, 9:26 AM  "

## 2024-09-30 ENCOUNTER — Ambulatory Visit: Admitting: Pulmonary Disease

## 2024-09-30 DIAGNOSIS — I491 Atrial premature depolarization: Secondary | ICD-10-CM

## 2024-09-30 DIAGNOSIS — Z5181 Encounter for therapeutic drug level monitoring: Secondary | ICD-10-CM

## 2024-09-30 DIAGNOSIS — R002 Palpitations: Secondary | ICD-10-CM

## 2024-11-08 ENCOUNTER — Ambulatory Visit

## 2024-11-21 ENCOUNTER — Ambulatory Visit: Admitting: Physician Assistant

## 2025-02-05 ENCOUNTER — Ambulatory Visit: Admitting: Internal Medicine

## 2025-02-06 ENCOUNTER — Ambulatory Visit
